# Patient Record
Sex: Female | Born: 1965 | Race: White | Hispanic: No | Marital: Married | Smoking: Current every day smoker
Health system: Southern US, Community
[De-identification: ages and names within clinical notes are randomized; demographics above are authoritative.]

## PROBLEM LIST (undated history)

## (undated) DIAGNOSIS — Z8719 Personal history of other diseases of the digestive system: Secondary | ICD-10-CM

## (undated) DIAGNOSIS — R413 Other amnesia: Secondary | ICD-10-CM

## (undated) DIAGNOSIS — R51 Headache: Secondary | ICD-10-CM

## (undated) DIAGNOSIS — I10 Essential (primary) hypertension: Secondary | ICD-10-CM

## (undated) DIAGNOSIS — G8929 Other chronic pain: Secondary | ICD-10-CM

## (undated) DIAGNOSIS — F430 Acute stress reaction: Secondary | ICD-10-CM

## (undated) DIAGNOSIS — F410 Panic disorder [episodic paroxysmal anxiety] without agoraphobia: Secondary | ICD-10-CM

## (undated) DIAGNOSIS — E785 Hyperlipidemia, unspecified: Secondary | ICD-10-CM

## (undated) DIAGNOSIS — F32A Depression, unspecified: Secondary | ICD-10-CM

## (undated) DIAGNOSIS — D649 Anemia, unspecified: Secondary | ICD-10-CM

## (undated) DIAGNOSIS — K219 Gastro-esophageal reflux disease without esophagitis: Secondary | ICD-10-CM

## (undated) DIAGNOSIS — R55 Syncope and collapse: Secondary | ICD-10-CM

## (undated) DIAGNOSIS — F329 Major depressive disorder, single episode, unspecified: Secondary | ICD-10-CM

## (undated) DIAGNOSIS — T8859XA Other complications of anesthesia, initial encounter: Secondary | ICD-10-CM

## (undated) DIAGNOSIS — R251 Tremor, unspecified: Secondary | ICD-10-CM

## (undated) DIAGNOSIS — R569 Unspecified convulsions: Secondary | ICD-10-CM

## (undated) DIAGNOSIS — K589 Irritable bowel syndrome without diarrhea: Secondary | ICD-10-CM

## (undated) DIAGNOSIS — T4145XA Adverse effect of unspecified anesthetic, initial encounter: Secondary | ICD-10-CM

## (undated) DIAGNOSIS — E119 Type 2 diabetes mellitus without complications: Secondary | ICD-10-CM

## (undated) DIAGNOSIS — M549 Dorsalgia, unspecified: Secondary | ICD-10-CM

## (undated) DIAGNOSIS — M1990 Unspecified osteoarthritis, unspecified site: Secondary | ICD-10-CM

## (undated) DIAGNOSIS — G4700 Insomnia, unspecified: Secondary | ICD-10-CM

## (undated) HISTORY — DX: Major depressive disorder, single episode, unspecified: F32.9

## (undated) HISTORY — DX: Syncope and collapse: R55

## (undated) HISTORY — PX: APPENDECTOMY: SHX54

## (undated) HISTORY — DX: Depression, unspecified: F32.A

## (undated) HISTORY — DX: Panic disorder (episodic paroxysmal anxiety): F41.0

## (undated) HISTORY — DX: Other amnesia: R41.3

## (undated) HISTORY — DX: Anemia, unspecified: D64.9

## (undated) HISTORY — DX: Other chronic pain: G89.29

## (undated) HISTORY — DX: Unspecified convulsions: R56.9

## (undated) HISTORY — PX: TONSILLECTOMY: SUR1361

## (undated) HISTORY — DX: Hyperlipidemia, unspecified: E78.5

## (undated) HISTORY — PX: ABDOMINAL HYSTERECTOMY: SHX81

## (undated) HISTORY — DX: Tremor, unspecified: R25.1

## (undated) HISTORY — DX: Headache: R51

## (undated) HISTORY — DX: Insomnia, unspecified: G47.00

## (undated) HISTORY — DX: Dorsalgia, unspecified: M54.9

## (undated) HISTORY — PX: CHOLECYSTECTOMY: SHX55

## (undated) HISTORY — DX: Type 2 diabetes mellitus without complications: E11.9

## (undated) HISTORY — DX: Acute stress reaction: F43.0

## (undated) HISTORY — DX: Irritable bowel syndrome, unspecified: K58.9

## (undated) HISTORY — DX: Personal history of other diseases of the digestive system: Z87.19

## (undated) HISTORY — DX: Essential (primary) hypertension: I10

---

## 1994-12-20 ENCOUNTER — Encounter: Payer: Self-pay | Admitting: Internal Medicine

## 1998-01-15 ENCOUNTER — Other Ambulatory Visit: Admission: RE | Admit: 1998-01-15 | Discharge: 1998-01-15 | Payer: Self-pay | Admitting: Obstetrics and Gynecology

## 2003-09-09 ENCOUNTER — Encounter: Admission: RE | Admit: 2003-09-09 | Discharge: 2003-09-09 | Payer: Self-pay | Admitting: Family Medicine

## 2003-10-30 ENCOUNTER — Encounter: Payer: Self-pay | Admitting: Family Medicine

## 2004-12-09 ENCOUNTER — Ambulatory Visit (HOSPITAL_COMMUNITY): Admission: RE | Admit: 2004-12-09 | Discharge: 2004-12-09 | Payer: Self-pay | Admitting: Family Medicine

## 2005-12-13 ENCOUNTER — Ambulatory Visit (HOSPITAL_COMMUNITY): Admission: RE | Admit: 2005-12-13 | Discharge: 2005-12-13 | Payer: Self-pay | Admitting: General Practice

## 2007-01-03 ENCOUNTER — Ambulatory Visit (HOSPITAL_COMMUNITY): Admission: RE | Admit: 2007-01-03 | Discharge: 2007-01-03 | Payer: Self-pay | Admitting: General Practice

## 2007-08-22 ENCOUNTER — Encounter: Payer: Self-pay | Admitting: Internal Medicine

## 2008-01-04 HISTORY — PX: ESOPHAGOGASTRODUODENOSCOPY: SHX1529

## 2008-01-04 HISTORY — PX: COLONOSCOPY W/ BIOPSIES: SHX1374

## 2008-01-24 ENCOUNTER — Ambulatory Visit (HOSPITAL_COMMUNITY): Admission: RE | Admit: 2008-01-24 | Discharge: 2008-01-24 | Payer: Self-pay | Admitting: General Practice

## 2008-02-25 ENCOUNTER — Encounter: Payer: Self-pay | Admitting: Internal Medicine

## 2008-04-17 DIAGNOSIS — K589 Irritable bowel syndrome without diarrhea: Secondary | ICD-10-CM | POA: Insufficient documentation

## 2008-04-17 DIAGNOSIS — Z860100 Personal history of colon polyps, unspecified: Secondary | ICD-10-CM | POA: Insufficient documentation

## 2008-04-17 DIAGNOSIS — E119 Type 2 diabetes mellitus without complications: Secondary | ICD-10-CM | POA: Insufficient documentation

## 2008-04-17 DIAGNOSIS — E785 Hyperlipidemia, unspecified: Secondary | ICD-10-CM | POA: Insufficient documentation

## 2008-04-17 DIAGNOSIS — Z794 Long term (current) use of insulin: Secondary | ICD-10-CM

## 2008-04-21 ENCOUNTER — Ambulatory Visit: Payer: Self-pay | Admitting: Internal Medicine

## 2008-04-21 DIAGNOSIS — Z8719 Personal history of other diseases of the digestive system: Secondary | ICD-10-CM

## 2008-04-21 DIAGNOSIS — F411 Generalized anxiety disorder: Secondary | ICD-10-CM | POA: Insufficient documentation

## 2008-04-21 DIAGNOSIS — R10816 Epigastric abdominal tenderness: Secondary | ICD-10-CM | POA: Insufficient documentation

## 2008-04-21 DIAGNOSIS — R519 Headache, unspecified: Secondary | ICD-10-CM | POA: Insufficient documentation

## 2008-04-21 DIAGNOSIS — R51 Headache: Secondary | ICD-10-CM

## 2008-04-21 DIAGNOSIS — I10 Essential (primary) hypertension: Secondary | ICD-10-CM | POA: Insufficient documentation

## 2008-04-21 DIAGNOSIS — Z9089 Acquired absence of other organs: Secondary | ICD-10-CM | POA: Insufficient documentation

## 2008-04-21 DIAGNOSIS — F329 Major depressive disorder, single episode, unspecified: Secondary | ICD-10-CM | POA: Insufficient documentation

## 2008-04-21 HISTORY — DX: Headache: R51

## 2008-04-21 HISTORY — DX: Personal history of other diseases of the digestive system: Z87.19

## 2008-04-22 ENCOUNTER — Ambulatory Visit: Payer: Self-pay | Admitting: Internal Medicine

## 2008-04-22 ENCOUNTER — Encounter: Payer: Self-pay | Admitting: Internal Medicine

## 2008-04-23 ENCOUNTER — Telehealth: Payer: Self-pay | Admitting: Internal Medicine

## 2008-04-23 LAB — CONVERTED CEMR LAB
Alkaline Phosphatase: 77 units/L (ref 39–117)
Bilirubin, Direct: 0.2 mg/dL (ref 0.0–0.3)
Total Bilirubin: 0.7 mg/dL (ref 0.3–1.2)
Total Protein: 7.3 g/dL (ref 6.0–8.3)

## 2008-04-24 ENCOUNTER — Telehealth: Payer: Self-pay | Admitting: Internal Medicine

## 2008-04-24 ENCOUNTER — Encounter: Payer: Self-pay | Admitting: Internal Medicine

## 2008-05-28 ENCOUNTER — Ambulatory Visit: Payer: Self-pay | Admitting: Internal Medicine

## 2008-06-12 ENCOUNTER — Telehealth: Payer: Self-pay | Admitting: Internal Medicine

## 2008-12-19 ENCOUNTER — Encounter: Admission: RE | Admit: 2008-12-19 | Discharge: 2008-12-31 | Payer: Self-pay | Admitting: General Practice

## 2009-01-03 HISTORY — PX: LUMBAR FUSION: SHX111

## 2009-02-25 ENCOUNTER — Ambulatory Visit (HOSPITAL_COMMUNITY): Admission: RE | Admit: 2009-02-25 | Discharge: 2009-02-25 | Payer: Self-pay | Admitting: General Practice

## 2009-06-05 ENCOUNTER — Ambulatory Visit: Payer: Self-pay | Admitting: Family Medicine

## 2009-06-05 DIAGNOSIS — F319 Bipolar disorder, unspecified: Secondary | ICD-10-CM | POA: Insufficient documentation

## 2009-06-05 DIAGNOSIS — M549 Dorsalgia, unspecified: Secondary | ICD-10-CM | POA: Insufficient documentation

## 2009-07-15 ENCOUNTER — Ambulatory Visit: Payer: Self-pay | Admitting: Family Medicine

## 2009-07-15 ENCOUNTER — Encounter: Payer: Self-pay | Admitting: Family Medicine

## 2009-07-15 LAB — CONVERTED CEMR LAB
BUN: 13 mg/dL (ref 6–23)
CO2: 18 meq/L — ABNORMAL LOW (ref 19–32)
Cholesterol: 160 mg/dL (ref 0–200)
Creatinine, Ser: 0.82 mg/dL (ref 0.40–1.20)
Glucose, Bld: 201 mg/dL — ABNORMAL HIGH (ref 70–99)
HCT: 43.1 % (ref 36.0–46.0)
MCV: 99.3 fL (ref 78.0–100.0)
RBC: 4.34 M/uL (ref 3.87–5.11)
Total Bilirubin: 0.4 mg/dL (ref 0.3–1.2)
Total CHOL/HDL Ratio: 9.4
Triglycerides: 747 mg/dL — ABNORMAL HIGH (ref <150)
WBC: 8.6 10*3/uL (ref 4.0–10.5)

## 2009-07-24 ENCOUNTER — Telehealth: Payer: Self-pay | Admitting: Family Medicine

## 2009-08-03 ENCOUNTER — Ambulatory Visit: Payer: Self-pay | Admitting: Family Medicine

## 2009-08-05 ENCOUNTER — Ambulatory Visit: Payer: Self-pay | Admitting: Family Medicine

## 2009-08-05 ENCOUNTER — Encounter: Payer: Self-pay | Admitting: Family Medicine

## 2009-08-11 ENCOUNTER — Encounter: Admission: RE | Admit: 2009-08-11 | Discharge: 2009-09-22 | Payer: Self-pay | Admitting: Family Medicine

## 2009-08-31 ENCOUNTER — Telehealth: Payer: Self-pay | Admitting: Family Medicine

## 2009-09-11 ENCOUNTER — Ambulatory Visit: Payer: Self-pay | Admitting: Family Medicine

## 2009-09-11 ENCOUNTER — Ambulatory Visit (HOSPITAL_COMMUNITY): Admission: RE | Admit: 2009-09-11 | Discharge: 2009-09-11 | Payer: Self-pay | Admitting: Family Medicine

## 2009-09-11 DIAGNOSIS — R569 Unspecified convulsions: Secondary | ICD-10-CM | POA: Insufficient documentation

## 2009-09-16 ENCOUNTER — Encounter: Payer: Self-pay | Admitting: Family Medicine

## 2009-09-17 ENCOUNTER — Encounter: Admission: RE | Admit: 2009-09-17 | Discharge: 2009-09-17 | Payer: Self-pay | Admitting: Sports Medicine

## 2009-09-23 ENCOUNTER — Encounter
Admission: RE | Admit: 2009-09-23 | Discharge: 2009-12-22 | Payer: Self-pay | Source: Home / Self Care | Attending: Family Medicine | Admitting: Family Medicine

## 2009-10-22 ENCOUNTER — Encounter: Payer: Self-pay | Admitting: Family Medicine

## 2009-10-30 ENCOUNTER — Telehealth: Payer: Self-pay | Admitting: *Deleted

## 2009-11-13 ENCOUNTER — Ambulatory Visit: Payer: Self-pay | Admitting: Family Medicine

## 2009-11-17 ENCOUNTER — Inpatient Hospital Stay (HOSPITAL_COMMUNITY): Admission: RE | Admit: 2009-11-17 | Discharge: 2009-11-30 | Payer: Self-pay | Admitting: Orthopedic Surgery

## 2009-11-17 ENCOUNTER — Encounter (INDEPENDENT_AMBULATORY_CARE_PROVIDER_SITE_OTHER): Payer: Self-pay | Admitting: Orthopedic Surgery

## 2009-11-19 ENCOUNTER — Ambulatory Visit: Payer: Self-pay | Admitting: Vascular Surgery

## 2009-11-21 DIAGNOSIS — F312 Bipolar disorder, current episode manic severe with psychotic features: Secondary | ICD-10-CM

## 2009-12-31 ENCOUNTER — Telehealth (INDEPENDENT_AMBULATORY_CARE_PROVIDER_SITE_OTHER): Payer: Self-pay | Admitting: *Deleted

## 2010-01-01 ENCOUNTER — Ambulatory Visit: Admission: RE | Admit: 2010-01-01 | Discharge: 2010-01-01 | Payer: Self-pay | Source: Home / Self Care

## 2010-01-01 ENCOUNTER — Encounter: Payer: Self-pay | Admitting: Family Medicine

## 2010-01-01 LAB — CONVERTED CEMR LAB
CO2: 26 meq/L (ref 19–32)
Direct LDL: 68 mg/dL
Glucose, Bld: 282 mg/dL — ABNORMAL HIGH (ref 70–99)
Sodium: 137 meq/L (ref 135–145)
Total Bilirubin: 0.4 mg/dL (ref 0.3–1.2)
Total Protein: 6.8 g/dL (ref 6.0–8.3)

## 2010-01-05 ENCOUNTER — Encounter: Payer: Self-pay | Admitting: Family Medicine

## 2010-01-08 ENCOUNTER — Encounter: Payer: Self-pay | Admitting: *Deleted

## 2010-01-27 ENCOUNTER — Ambulatory Visit: Admit: 2010-01-27 | Payer: Self-pay

## 2010-02-04 NOTE — Assessment & Plan Note (Signed)
Summary: NP,CHRONIC BACK PAIN,MC   Vital Signs:  Patient profile:   45 year old female Height:      64.5 inches Weight:      305.1 pounds BMI:     51.75 Pulse rate:   108 / minute BP sitting:   138 / 81  (right arm)  Vitals Entered By: Terese Door (August 03, 2009 1:34 PM) CC: chronic back pain   Primary Care Provider:  Ellery Plunk MD  CC:  chronic back pain.  History of Present Illness: chronic back pain here for second opinion from her [facility] doctor. Pain is diffuse, constant 5-6 /10 in low back. WWorse with standing or walkking. Some better with sitting. Some radiation to upper buttock but not into legs. No incontinence. No leg weakness. No leg numbness or parasthesias.  Pain has been worse in last 1 year or so. Does not exercise. Does NOT want to try physical therapy.  PERTINENT PMH/PSH: No specific back injury. Has gained a significant amount of weight (> 40 pounds) in last year  Current Medications (verified): 1)  Lithium Carbonate 300 Mg Tabs (Lithium Carbonate) .... 3 Tablets By Mouth Qhs 2)  Lamictal 100 Mg Tabs (Lamotrigine) .... One Tablet By Mouth Two Times A Day 3)  Cymbalta 60 Mg Cpep (Duloxetine Hcl) .... One Tablet By Mouth Once Daily 4)  Alprazolam 1 Mg Tabs (Alprazolam) .... Take One in Am, One in Pm and 1.5 At Bedtime Per Psychiatry, Not Mcfp 5)  Pravastatin Sodium 40 Mg Tabs (Pravastatin Sodium) .... One Tablet By Mouth Once Daily 6)  Lisinopril-Hydrochlorothiazide 20-12.5 Mg Tabs (Lisinopril-Hydrochlorothiazide) .... One Tablet By Mouth Once Daily 7)  Tricor 145 Mg Tabs (Fenofibrate) .... One Tablet By Mouth Once Daily 8)  Metformin Hcl 1000 Mg Tabs (Metformin Hcl) .... One Tablet By Mouth Two Times A Day 9)  Questran 4 Gm Pack (Cholestyramine) .... Take 1 Pack Dissolved in Water or Juice Once Daily. Take At Least 1 Hour Away From Other Medications. 10)  Nexium 40 Mg Cpdr (Esomeprazole Magnesium) .... Take 1 Tablet By Mouth Once A Day 11)  Lidoderm 5 %  Ptch (Lidocaine) .... Place At Site of Pain (Up and Down Back) For 12 Hours Then Remove 12)  Januvia 50 Mg Tabs (Sitagliptin Phosphate) .... Take One Daily 13)  Meloxicam 15 Mg Tabs (Meloxicam) .... One Daily 14)  Furosemide 40 Mg Tabs (Furosemide) .... Take Every Other Day 15)  Norvasc 10 Mg Tabs (Amlodipine Besylate) 16)  Claritin 10 Mg Tabs (Loratadine)  Allergies: No Known Drug Allergies  Review of Systems       The patient complains of weight gain.  The patient denies anorexia, fever, and weight loss.         Please see HPI for additional ROS.   Physical Exam  General:  alert, well-developed, well-nourished, well-hydrated, and overweight-appearing.   Msk:  No deformity or scoliosis noted of thoracic or lumbar spine.   Extremities:  No clubbing, cyanosis, edema, or deformity noted with normal full range of motion of all joints.   Additional Exam:  I reviewed the ;ateral LS spine film she brings with her--unfortunately itis of little use as her habitus precludes much detail. It is grossly normal otherwise.   Detailed Back/Spine Exam  Lumbosacral Exam:  Inspection-deformity:    Normal Palpation-spinal tenderness:  Normal Range of Motion:    Forward Flexion:   80 degrees    Hyperextension:   10 degrees    Right Lateral Bend:  20 degrees    Left Lateral Bend:   20 degrees Lying Straight Leg Raise:    Right:  negative    Left:  negative Sitting Straight Leg Raise:    Right:  negative    Left:  negative Contralateral Straight Leg Raise:    Right:  negative    Left:  negative Toe Walking:    Right:  normal    Left:  normal Heel Walking:    Right:  normal    Left:  normal Fabere Test:    Right:  negative    Left:  negative   Impression & Recommendations:  Problem # 1:  BACK PAIN, CHRONIC (ICD-724.5) long discussion--poor strength and flexibility inher core, overweight and probably some facet arthritis are the most likely etiology for her back pain. PT would be  her best bet but she does not want to pursue. I gave hger handout on low back HEP and explained in detail. I would have her f/u w PCP  NOTE: WHile here, patient had an apparent seizure with alteremd mental stae, no convulsions, some dilation of her left pupil. No incontinence. Her altered level of consciousness lasted for about 2 minites and her pupil returned to normal size soon therafter. It was  reactive at the time of her "spell". Heer husband was with her and informed us this is common and is being worked up by PCP.  Complete Medication List: 1)  Lithium Carbonate 300 Mg Tabs (Lithium carbonate) .... 3 tablets by mouth qhs 2)  Lamictal 100 Mg Tabs (Lamotrigine) .... One tablet by mouth two times a day 3)  Cymbalta 60 Mg Cpep (Duloxetine hcl) .... One tablet by mouth once daily 4)  Alprazolam 1 Mg Tabs (Alprazolam) .... Take one in am, one in pm and 1.5 at bedtime per psychiatry, not mcfp 5)  Pravastatin Sodium 40 Mg Tabs (Pravastatin sodium) .... One tablet by mouth once daily 6)  Lisinopril-hydrochlorothiazide 20-12.5 Mg Tabs (Lisinopril-hydrochlorothiazide) .... One tablet by mouth once daily 7)  Tricor 145 Mg Tabs (Fenofibrate) .... One tablet by mouth once daily 8)  Metformin Hcl 1000 Mg Tabs (Metformin hcl) .... One tablet by mouth two times a day 9)  Questran 4 Gm Pack (Cholestyramine) .... Take 1 pack dissolved in water or juice once daily. take at least 1 hour away from other medications. 10)  Nexium 40 Mg Cpdr (Esomeprazole magnesium) .... Take 1 tablet by mouth once a day 11)  Lidoderm 5 % Ptch (Lidocaine) .... Place at site of pain (up and down back) for 12 hours then remove 12)  Januvia 50 Mg Tabs (Sitagliptin phosphate) .... Take one daily 13)  Meloxicam 15 Mg Tabs (Meloxicam) .... One daily 14)  Furosemide 40 Mg Tabs (Furosemide) .... Take every other day 15)  Norvasc 10 Mg Tabs (Amlodipine besylate) 16)  Claritin 10 Mg Tabs (Loratadine)  Appended Document: NP,CHRONIC BACK  PAIN,MC pt changed her mind and agrees to formal PT we will set up

## 2010-02-04 NOTE — Letter (Signed)
Summary: Psychciatry-Dr. Emerson Monte  Psychciatry-Dr. Emerson Monte   Imported By: Clydell Hakim 08/10/2009 15:44:01  _____________________________________________________________________  External Attachment:    Type:   Image     Comment:   External Document

## 2010-02-04 NOTE — Assessment & Plan Note (Signed)
Summary: f/up,tcb   Vital Signs:  Patient profile:   45 year old female Height:      64.5 inches Weight:      297 pounds BMI:     50.37 BSA:     2.33 Temp:     98.9 degrees F Pulse rate:   111 / minute BP sitting:   130 / 85  Vitals Entered By: Jone Baseman CMA (September 11, 2009 9:56 AM) CC: Diabetes, back pain, obesity, ? seizures Is Patient Diabetic? Yes Did you bring your meter with you today? No Pain Assessment Patient in pain? yes     Location: back Intensity: 9   Primary Care Provider:  Ellery Plunk MD  CC:  Diabetes, back pain, obesity, and ? seizures.  History of Present Illness: DM- fasting CBG 150-250, taking metformin and januvia.  working on diet.  will be going to DME this week.  back pain-  failed PT, pain in lower back is worse, continuing to exercise and use heat.  she is concerned that she has a fracture in lower back and that last xrays were poor quality.  pt wants new xrays and referral to ortho as her father had back surgery and pain improved.  obesity-weight down 7 lbs.  trying to exercise more and eat better.  wants to consider gastric bypass  ? seizures- has several year hx of periods where she loses her awareness of surroundings.  one occurred in Dr. Donnetta Hail office and her note states that pt had pupillary changes during this episode.  However, pt has recently had some periods of jerking.  she states that both sides of her body jerk but that she can still hear people.  she is not driving.   Habits & Providers  Alcohol-Tobacco-Diet     Alcohol drinks/day: 0     Tobacco Status: current     Tobacco Counseling: to quit use of tobacco products     Cigarette Packs/Day: 1.0     Diet Comments: poor     Diet Counseling: to improve diet; diet is suboptimal  Current Medications (verified): 1)  Lithium Carbonate 300 Mg Tabs (Lithium Carbonate) .... 3 Tablets By Mouth Qhs 2)  Lamictal 100 Mg Tabs (Lamotrigine) .... One Tablet By Mouth Two Times A  Day 3)  Cymbalta 60 Mg Cpep (Duloxetine Hcl) .... One Tablet By Mouth Once Daily 4)  Alprazolam 1 Mg Tabs (Alprazolam) .... Take One in Am, One in Pm and 1.5 At Bedtime Per Psychiatry, Not Mcfp 5)  Pravastatin Sodium 40 Mg Tabs (Pravastatin Sodium) .... One Tablet By Mouth Once Daily 6)  Lisinopril-Hydrochlorothiazide 20-12.5 Mg Tabs (Lisinopril-Hydrochlorothiazide) .... One Tablet By Mouth Once Daily 7)  Tricor 145 Mg Tabs (Fenofibrate) .... One Tablet By Mouth Once Daily 8)  Metformin Hcl 1000 Mg Tabs (Metformin Hcl) .... One Tablet By Mouth Two Times A Day 9)  Questran 4 Gm Pack (Cholestyramine) .... Take 1 Pack Dissolved in Water or Juice Once Daily. Take At Least 1 Hour Away From Other Medications. 10)  Nexium 40 Mg Cpdr (Esomeprazole Magnesium) .... Take 1 Tablet By Mouth Once A Day 11)  Lidoderm 5 % Ptch (Lidocaine) .... Place At Site of Pain (Up and Down Back) For 12 Hours Then Remove 12)  Januvia 50 Mg Tabs (Sitagliptin Phosphate) .... Take One Daily 13)  Meloxicam 15 Mg Tabs (Meloxicam) .... One Daily 14)  Furosemide 40 Mg Tabs (Furosemide) .... Take Every Other Day 15)  Norvasc 10 Mg Tabs (Amlodipine Besylate)  16)  Claritin 10 Mg Tabs (Loratadine)  Allergies (verified): No Known Drug Allergies  Past History:  Past Medical History: Current Problems:  OBESITY (ICD-278.00) HYPERTENSION (ICD-401.9) HX OF GALLSTONE (ICD-V12.79) DEPRESSION (ICD-311) HEADACHE, CHRONIC (ICD-784.0) ANXIETY (ICD-300.00) COLONIC POLYPS, HYPERPLASTIC, HX OF (ICD-V12.72) HYPERLIPIDEMIA (ICD-272.4) DIABETES MELLITUS (ICD-250.00) IRRITABLE BOWEL SYNDROME (ICD-564.1) panic attacks insomnia tremor "short term memory loss"  chronic back pain  Review of Systems  The patient denies anorexia, fever, hoarseness, chest pain, syncope, and headaches.    Physical Exam  General:  VS reviewed.  obese.  alert.   Head:  normocephalic and atraumatic.   Eyes:  vision grossly intact, pupils equal, pupils  round, and pupils reactive to light.  EOMI. Lungs:  Normal respiratory effort, chest expands symmetrically. Lungs are clear to auscultation, no crackles or wheezes. Heart:  Normal rate and regular rhythm. S1 and S2 normal without gallop, murmur, click, rub or other extra sounds. Abdomen:  obese.  soft and non-tender.   Msk:  normal gait, normal ROM and strength in upper and lower ext.  has grossly normal ROM in back.  TTP on muscles arround spine of lumbar region.  no TTP on spinus processes Neurologic:  alert & oriented X3, cranial nerves II-XII intact, strength normal in all extremities, sensation intact to light touch, gait normal, and finger-to-nose normal.   Psych:  Oriented X3, memory intact for recent and remote, normally interactive, and moderately anxious.     Impression & Recommendations:  Problem # 1:  BACK PAIN, CHRONIC (ICD-724.5) Assessment Deteriorated pain continues to worsen despite PT.  PT was discontinued since pt was not improving.  WIll obtain xrays today due to pt concern though very unlikely a fx much more likely OA.  I do not think that any operation would be done, but pt desires referral to ortho since her father benefits from back surgery.  Her updated medication list for this problem includes:    Meloxicam 15 Mg Tabs (Meloxicam) ..... One daily  Orders: Diagnostic X-Ray/Fluoroscopy (Diagnostic X-Ray/Flu) Diagnostic X-Ray/Fluoroscopy (Diagnostic X-Ray/Flu) Orthopedic Referral (Ortho) [facility]- Est  Level 4 ([address]109)  Problem # 2:  OTHER CONVULSIONS (ICD-780.39) Assessment: Deteriorated pt with long hx of staring spells and drop spells.  she has been worked up for them before and been told that they were "silent seizures"  she is now complaining of new symptoms including jerking bilaterally while she can still hear people around her.  given her complicated medical situation, having a neurologist here see her for her symptoms seems reasonable, though the new symptoms dont  seem consistent with seizure.    Orders: Neurology Referral (Neuro) [facility]- Est  Level 4 (60454)  Problem # 3:  OBESITY (ICD-278.00) Assessment: Improved pt working to improve diet and exercise.  pt to look into gastric bypass as to which doctors take medicaid.  she will let us know if she needs a rferral from [place].  Orders: [facility]- Est  Level 4 (09811)  Problem # 4:  DIABETES MELLITUS (ICD-250.00) Assessment: Improved A1C very good today.  pt to continue meds, diet, and exercise. Her updated medication list for this problem includes:    Lisinopril-hydrochlorothiazide 20-12.5 Mg Tabs (Lisinopril-hydrochlorothiazide) ..... One tablet by mouth once daily    Metformin Hcl 1000 Mg Tabs (Metformin hcl) ..... One tablet by mouth two times a day    Januvia 50 Mg Tabs (Sitagliptin phosphate) .Marland Kitchen... Take one daily  Orders: [facility]- Est  Level 4 (91478)  Complete Medication List: 1)  Lithium Carbonate 300 Mg  Tabs (Lithium carbonate) .... 3 tablets by mouth qhs 2)  Lamictal 100 Mg Tabs (Lamotrigine) .... One tablet by mouth two times a day 3)  Cymbalta 60 Mg Cpep (Duloxetine hcl) .... One tablet by mouth once daily 4)  Alprazolam 1 Mg Tabs (Alprazolam) .... Take one in am, one in pm and 1.5 at bedtime per psychiatry, not mcfp 5)  Pravastatin Sodium 40 Mg Tabs (Pravastatin sodium) .... One tablet by mouth once daily 6)  Lisinopril-hydrochlorothiazide 20-12.5 Mg Tabs (Lisinopril-hydrochlorothiazide) .... One tablet by mouth once daily 7)  Tricor 145 Mg Tabs (Fenofibrate) .... One tablet by mouth once daily 8)  Metformin Hcl 1000 Mg Tabs (Metformin hcl) .... One tablet by mouth two times a day 9)  Questran 4 Gm Pack (Cholestyramine) .... Take 1 pack dissolved in water or juice once daily. take at least 1 hour away from other medications. 10)  Nexium 40 Mg Cpdr (Esomeprazole magnesium) .... Take 1 tablet by mouth once a day 11)  Lidoderm 5 % Ptch (Lidocaine) .... Place at site of pain (up and down back) for  12 hours then remove 12)  Januvia 50 Mg Tabs (Sitagliptin phosphate) .... Take one daily 13)  Meloxicam 15 Mg Tabs (Meloxicam) .... One daily 14)  Furosemide 40 Mg Tabs (Furosemide) .... Take every other day 15)  Norvasc 10 Mg Tabs (Amlodipine besylate) 16)  Claritin 10 Mg Tabs (Loratadine)

## 2010-02-04 NOTE — Progress Notes (Signed)
Summary: Rx Req  Phone Note Refill Request Call back at Home Phone [phone] Message from:  Patient  Refills Requested: Medication #1:  PRAVASTATIN SODIUM 40 MG TABS one tablet by mouth once daily [facility].  Initial call taken by: Clydell Hakim,  October 30, 2009 2:28 PM  Follow-up for Phone Call        filled Follow-up by: Ellery Plunk MD,  October 30, 2009 3:00 PM    Prescriptions: PRAVASTATIN SODIUM 40 MG TABS (PRAVASTATIN SODIUM) one tablet by mouth once daily  #30 x 6   Entered and Authorized by:   Ellery Plunk MD   Signed by:   Ellery Plunk MD on 10/30/2009   Method used:   Electronically to        [facility]  [phone]* (retail)       [address]       [place], [place]  [address]       Ph: [phone] or [phone]       Fax: [phone]   RxID:   [id number]

## 2010-02-04 NOTE — Progress Notes (Signed)
  Phone Note Outgoing Call   Summary of Call: discussed lab results with pt.  triglycerides still high.  stay on tricor, add over the counter fish oil, and avoid any fried foods.  also, pt complaining of back pain that is chronic.  she wants to go to a back surgeon and does not want physical therapy.  Discussed with Dr. Jennette Kettle who recommends [facility] visit. Initial call taken by: Ellery Plunk MD,  July 24, 2009 2:36 PM

## 2010-02-04 NOTE — Consult Note (Signed)
Summary: [facility]   Imported By: Clydell Hakim 09/24/2009 11:26:39  _____________________________________________________________________  External Attachment:    Type:   Image     Comment:   External Document

## 2010-02-04 NOTE — Miscellaneous (Signed)
Summary: re: pain meds/TS  called pt. Advised to call surgeon for pain meds. Pt said, that she saw her surgeon yesterday. Also advised pt to keep her upcoming appt with her PCP Dr.Spiegel 01-27-10. Pt agreed.Arlyss Repress CMA,  January 08, 2010 11:32 AM

## 2010-02-04 NOTE — Progress Notes (Signed)
Summary: Rx  Phone Note Call from Patient Call back at Home Phone [phone]   Reason for Call: Talk to Nurse Summary of Call: pt recently discharged from [facility], was given Rx for DM supplies but was not given Rx for syringes, pt needs [facility]. Pt was only given 1 bottle of insulin so may need refills on it too, this is pts 1st time using insulin.  Initial call taken by: Knox Royalty,  December 31, 2009 2:00 PM  Follow-up for Phone Call        patient was discharge just a few hours ago from facility , states she was started on insulin in hospital . Rehab facility would not give her any syringes to take home. from E Chart it is noted that patient is on Novolog insulin per sliding scale. consulted with Dr.Neal and she ok's to send in syringes.  patient is confused about meds that she is suppose to be on . she doesn't think she was getting BP meds at Rehab facility and she states her BP has been running high. she is not sure what she is suppose to be on now. appointment scheduled for tomorrow PM with Dr. Jeanice Lim to help patient with this. Follow-up by: Theresia Lo RN,  December 31, 2009 3:02 PM

## 2010-02-04 NOTE — Progress Notes (Signed)
Summary: meds prob  Phone Note Call from Patient Call back at Home Phone [phone]   Caller: Patient Summary of Call: wants to know if she can have something called in for back pain- only took 1 hydrocodone and it didn't work so she didn't take anymore.  Initial call taken by: De Nurse,  August 31, 2009 11:50 AM  Follow-up for Phone Call        states she has some pain pills left over from last May. has not taken any since then. pain is a 9/10. goes to PT twice a week & does her exercises at home. has not taken anything for the pain, not even tylenol. does use a heating pad which helps.   offered her an appt. states she has one on 09/11/09 & since she has waited this long, she will wait until then. told her if she changes her mind we can usually get her in same day, though it may not be with her md.  advised trying one of her pain meds, continue exercises & heating pad. she is also using her husband's TENS unit which helps. to pcp Follow-up by: Golden Circle RN,  August 31, 2009 11:56 AM  Additional Follow-up for Phone Call Additional follow up Details #1::        Thanks Kennon Rounds.  THis is chronic back pain and I will avoid starting chronic narcotics if I can.  I guess I will see her when she comes in. Additional Follow-up by: Ellery Plunk MD,  August 31, 2009 7:00 PM

## 2010-02-04 NOTE — Assessment & Plan Note (Signed)
Summary: f/u results/eo   Vital Signs:  Patient profile:   45 year old female Height:      64.5 inches Weight:      303 pounds BMI:     51.39 BSA:     2.35 Temp:     98.0 degrees F Pulse rate:   98 / minute BP sitting:   129 / 81  Vitals Entered By: Jone Baseman CMA (August 05, 2009 8:56 AM) CC: f/u DM, obesity, back pain, psych Is Patient Diabetic? Yes Did you bring your meter with you today? No Pain Assessment Patient in pain? no        Diabetic Foot Exam Foot Inspection Is there a history of a foot ulcer?              No Is there a foot ulcer now?              No Can the patient see the bottom of their feet?          No Are the shoes appropriate in style and fit?          Yes Is there swelling or an abnormal foot shape?          Yes Are the toenails long?                No Are the toenails thick?                No Are the toenails ingrown?              No Is there heavy callous build-up?              Yes Is there pain in the calf muscle (Intermittent claudication) when walking?    NoIs there a claw toe deformity?              No Is there elevated skin temperature?            No Is there limited ankle dorsiflexion?            No Is there foot or ankle muscle weakness?            No  Diabetic Foot Care Education Patient educated on appropriate care of diabetic feet.   High Risk Feet? No   10-g (5.07) Semmes-Weinstein Monofilament Test           Right Foot          Left Foot Visual Inspection               Test Control      normal         normal Site 1         normal         normal Site 2         normal         normal Site 3         normal         normal Site 4         normal         normal Site 5         normal         normal Site 6         normal         normal Site 7         normal         normal  Site 8         normal         normal Site 9         normal         normal Site 10         normal         normal  Impression      normal          normal   Primary Care Provider:  Ellery Plunk MD  CC:  f/u DM, obesity, back pain, and psych.  History of Present Illness: DM- wants some more education on diet.  she feels "mind boggled" and her short term memory loss prevents her from remembering what she can eat.  Takes CBGs in AM, but not written down.    Obesity- considering gastric bypass vs lap band.  will need a provider that takes medicaid.  no exercise.  no change in diet.  "doesn't eat" meaning that she skips meals a lot.  back pain-  has seen [facility].  they reccomended PT, which she will start on Tuesday.  She is excited about starting this.    Psychiatric problems-  NP is Valinda Hoar.  She is ok with [place] getting records.  NP is checking levels of lithium and following blood work.  She feels her issues are much better and would like to start getting health under better control.  Habits & Providers  Alcohol-Tobacco-Diet     Tobacco Status: current     Tobacco Counseling: to quit use of tobacco products     Cigarette Packs/Day: 1.0  Current Medications (verified): 1)  Lithium Carbonate 300 Mg Tabs (Lithium Carbonate) .... 3 Tablets By Mouth Qhs 2)  Lamictal 100 Mg Tabs (Lamotrigine) .... One Tablet By Mouth Two Times A Day 3)  Cymbalta 60 Mg Cpep (Duloxetine Hcl) .... One Tablet By Mouth Once Daily 4)  Alprazolam 1 Mg Tabs (Alprazolam) .... Take One in Am, One in Pm and 1.5 At Bedtime Per Psychiatry, Not [facility] 5)  Pravastatin Sodium 40 Mg Tabs (Pravastatin Sodium) .... One Tablet By Mouth Once Daily 6)  Lisinopril-Hydrochlorothiazide 20-12.5 Mg Tabs (Lisinopril-Hydrochlorothiazide) .... One Tablet By Mouth Once Daily 7)  Tricor 145 Mg Tabs (Fenofibrate) .... One Tablet By Mouth Once Daily 8)  Metformin Hcl 1000 Mg Tabs (Metformin Hcl) .... One Tablet By Mouth Two Times A Day 9)  Questran 4 Gm Pack (Cholestyramine) .... Take 1 Pack Dissolved in Water or Juice Once Daily. Take At Least 1 Hour Away From Other Medications. 10)   Nexium 40 Mg Cpdr (Esomeprazole Magnesium) .... Take 1 Tablet By Mouth Once A Day 11)  Lidoderm 5 % Ptch (Lidocaine) .... Place At Site of Pain (Up and Down Back) For 12 Hours Then Remove 12)  Januvia 50 Mg Tabs (Sitagliptin Phosphate) .... Take One Daily 13)  Meloxicam 15 Mg Tabs (Meloxicam) .... One Daily 14)  Furosemide 40 Mg Tabs (Furosemide) .... Take Every Other Day 15)  Norvasc 10 Mg Tabs (Amlodipine Besylate) 16)  Claritin 10 Mg Tabs (Loratadine)  Allergies (verified): No Known Drug Allergies  Past History:  Social History: Last updated: 08/05/2009 Illicit Drug Use - no Married, husband on disability, pt also just got disability and medicaid Patient currently smokes 1 ppd.  Alcohol Use - no  Social History: Illicit Drug Use - no Married, husband on disability, pt also just got disability and medicaid Patient currently smokes 1 ppd.  Alcohol Use - no  Review  of Systems  The patient denies anorexia, fever, weight loss, chest pain, syncope, prolonged cough, and abdominal pain.    Physical Exam  General:  vital signs reviewedalert, appropriate dress, cooperative to examination, and overweight-appearing.   Head:  Normocephalic and atraumatic without obvious abnormalities. No apparent alopecia or balding. Lungs:  Normal respiratory effort, chest expands symmetrically. Lungs are clear to auscultation, no crackles or wheezes. Heart:  distant heart sounds regular rhythm.   Abdomen:  soft and non-tender.   Pulses:  R posterior tibial normal, R dorsalis pedis normal, L posterior tibial normal, and L dorsalis pedis normal.   Extremities:  1+ left pedal edema and 1+ right pedal edema.   Skin:  dry and calloused on feet  Diabetes Management Exam:    Foot Exam (with socks and/or shoes not present):       Sensory-Monofilament:          Left foot: normal          Right foot: normal   Impression & Recommendations:  Problem # 1:  DIABETES MELLITUS (ICD-250.00) Assessment  Unchanged will check A1c in october.  will send for DM education.  might do well with Dr. Gerilyn Pilgrim but sending over to the hospital for now.  seems motivated.  see in one month for f/u Her updated medication list for this problem includes:    Lisinopril-hydrochlorothiazide 20-12.5 Mg Tabs (Lisinopril-hydrochlorothiazide) ..... One tablet by mouth once daily    Metformin Hcl 1000 Mg Tabs (Metformin hcl) ..... One tablet by mouth two times a day    Januvia 50 Mg Tabs (Sitagliptin phosphate) .Marland Kitchen... Take one daily  Problem # 2:  BACK PAIN, CHRONIC (ICD-724.5) Assessment: Improved seen by [facility] and sent to PT.  she is interested in this approach.   Her updated medication list for this problem includes:    Meloxicam 15 Mg Tabs (Meloxicam) ..... One daily  Problem # 3:  OBESITY (ICD-278.00) Assessment: Unchanged interested in gastric bypass.  I told her to start with DM education.  Will need to find provider that takes medicaid.  Problem # 4:  BIPOLAR DISORDER UNSPECIFIED (ICD-296.80) Assessment: Improved  followed by psychiatry.  pt signed release for information today. will get records.  Complete Medication List: 1)  Lithium Carbonate 300 Mg Tabs (Lithium carbonate) .... 3 tablets by mouth qhs 2)  Lamictal 100 Mg Tabs (Lamotrigine) .... One tablet by mouth two times a day 3)  Cymbalta 60 Mg Cpep (Duloxetine hcl) .... One tablet by mouth once daily 4)  Alprazolam 1 Mg Tabs (Alprazolam) .... Take one in am, one in pm and 1.5 at bedtime per psychiatry, not [facility] 5)  Pravastatin Sodium 40 Mg Tabs (Pravastatin sodium) .... One tablet by mouth once daily 6)  Lisinopril-hydrochlorothiazide 20-12.5 Mg Tabs (Lisinopril-hydrochlorothiazide) .... One tablet by mouth once daily 7)  Tricor 145 Mg Tabs (Fenofibrate) .... One tablet by mouth once daily 8)  Metformin Hcl 1000 Mg Tabs (Metformin hcl) .... One tablet by mouth two times a day 9)  Questran 4 Gm Pack (Cholestyramine) .... Take 1 pack dissolved in  water or juice once daily. take at least 1 hour away from other medications. 10)  Nexium 40 Mg Cpdr (Esomeprazole magnesium) .... Take 1 tablet by mouth once a day 11)  Lidoderm 5 % Ptch (Lidocaine) .... Place at site of pain (up and down back) for 12 hours then remove 12)  Januvia 50 Mg Tabs (Sitagliptin phosphate) .... Take one daily 13)  Meloxicam 15 Mg Tabs (  Meloxicam) .... One daily 14)  Furosemide 40 Mg Tabs (Furosemide) .... Take every other day 15)  Norvasc 10 Mg Tabs (Amlodipine besylate) 16)  Claritin 10 Mg Tabs (Loratadine)  Other Orders: Nutrition Referral (Nutrition)  Patient Instructions: 1)  Come back in one month 2)  record your blood sugars each morning in a log book and bring them with you 3)  go to the diabetes education referral.  they will call you  Prevention & Chronic Care Immunizations   Influenza vaccine: Not documented    Tetanus booster: Not documented   Tetanus booster due: 07/17/2009    Pneumococcal vaccine: Not documented  Other Screening   Pap smear: Not documented   Pap smear action/deferral: Not indicated S/P hysterectomy  (06/05/2009)    Mammogram: Not documented   Mammogram action/deferral: Deferred  (06/05/2009)   Mammogram due: 06/10/2010   Smoking status: current  (08/05/2009)   Smoking cessation counseling: yes  (06/05/2009)   Target quit date: 09/18/2009  (06/05/2009)  Diabetes Mellitus   HgbA1C: 5.7  (07/15/2009)   Hemoglobin A1C due: 10/15/2009    Eye exam: Not documented    Foot exam: yes  (08/05/2009)   Foot exam action/deferral: Do today   High risk foot: No  (08/05/2009)   Foot care education: Done  (08/05/2009)    Urine microalbumin/creatinine ratio: Not documented  Lipids   Total Cholesterol: 160  (07/15/2009)   LDL: See Comment mg/dL  (11/91/4782)   LDL Direct: Not documented   HDL: 17  (07/15/2009)   Triglycerides: 747  (07/15/2009)    SGOT (AST): 24  (07/15/2009)   SGPT (ALT): 25  (07/15/2009)   Alkaline  phosphatase: 71  (07/15/2009)   Total bilirubin: 0.4  (07/15/2009)  Hypertension   Last Blood Pressure: 129 / 81  (08/05/2009)   Serum creatinine: 0.82  (07/15/2009)   Serum potassium 4.5  (07/15/2009)  Self-Management Support :    Diabetes self-management support: Not documented   Referred.    Hypertension self-management support: Not documented    Lipid self-management support: Not documented    Nursing Instructions: Diabetic foot exam today    Appended Document: f/u results/eo    Clinical Lists Changes  Orders: Added new Test order of [facility]- Est  Level 4 (95621) - Signed

## 2010-02-04 NOTE — Letter (Signed)
Summary: [facility] PT Referral form  [facility] PT Referral form   Imported By: Marily Memos 08/03/2009 16:48:20  _____________________________________________________________________  External Attachment:    Type:   Image     Comment:   External Document

## 2010-02-04 NOTE — Consult Note (Signed)
Summary: [facility]  [facility]   Imported By: Clydell Hakim 10/26/2009 16:44:23  _____________________________________________________________________  External Attachment:    Type:   Image     Comment:   External Document

## 2010-02-04 NOTE — Miscellaneous (Signed)
Summary: re: meds/ts  pt's husband walked into clinic and request refills of Hydroco/apap 10-650 and also generic Robaxin. Brought in empty bottles. will ask the preceptor for advise.Marland KitchenMarland KitchenArlyss Repress CMA,  January 05, 2010 9:15 AM pt's husband reports, that the pt is w/out any pain medication. [facility] would not fill her meds due to insurance problem. he needs two written rx's for the above medication. i told pt's husband, that i will ask the preceptor and that we will call him, once we have the rx's for him to pick up. dr.Kazumi Lachney is pcp and dr.Greenbush saw the pt last week to go over all the medications. see last ov. fwd. to dr.breen.Arlyss Repress CMA,  January 05, 2010 9:29 AM        Please address refills for walk-in requests to patient's PCP, or may schedule for visit to discuss. Paula Compton MD  January 05, 2010 10:29 AM \ reviewed last visit and previous notes.  Rx for pain meds per ortho.  pt will need to follow up with ortho for pain medications. Ellery Plunk MD  January 05, 2010 12:08 PM

## 2010-02-04 NOTE — Assessment & Plan Note (Signed)
Summary: confused about meds , see note from 12/229/2011/ls   Vital Signs:  Patient profile:   45 year old female Height:      64.5 inches Weight:      293.5 pounds BMI:     49.78 Pulse rate:   86 / minute BP sitting:   119 / 80  (right arm)  Vitals Entered By: Arlyss Repress CMA, (January 01, 2010 4:09 PM) CC: Medication reconcilliation Is Patient Diabetic? Yes Pain Assessment Patient in pain? yes     Location: lleft leg Intensity: 7 Onset of pain  surgery 11-22-09   Primary Care Provider:  Ellery Plunk MD  CC:  Medication reconcilliation.  History of Present Illness:    s/p L4-L5 and L5-S1 fusion, in Nov 2011, upon discharge went to [facility] for 4 weeks for rehab now confused about medications as she was initially on different doses of medications, including her anxiety medications, cymbalta, Diabetes meds etc.  Sleep still an issue- currently on Ambien  this does not help would like to discuss change in medication- Defferred to next visit   Needs a new meter and strips taking blood sugar 4x a day, needs DM supplies Needs Miralax for constipation     Pt with approx 20 medications to review  Habits & Providers  Alcohol-Tobacco-Diet     Tobacco Status: quit > 6 months     Tobacco Counseling: not to resume use of tobacco products  Current Medications (verified): 1)  Lamictal 100 Mg Tabs (Lamotrigine) .... One Tablet By Mouth Two Times A Day 2)  Cymbalta 60 Mg Cpep (Duloxetine Hcl) .... Take 2 Capsules Once A Day 3)  Alprazolam 1 Mg Tabs (Alprazolam) .... Take One in Am, One in Pm and 1.5 At Bedtime Per Psychiatry, Not Mcfp 4)  Tricor 145 Mg Tabs (Fenofibrate) .... One Tablet By Mouth Once Daily 5)  Metformin Hcl 1000 Mg Tabs (Metformin Hcl) .... One Tablet By Mouth Two Times A Day 6)  Questran 4 Gm Pack (Cholestyramine) .... Take 1 Pack Dissolved in Water or Juice Once Daily. Take At Least 1 Hour Away From Other Medications. 7)  Prilosec Otc 20 Mg Tbec  (Omeprazole Magnesium) .Marland Kitchen.. 1 By Mouth Daily For Heartburn 8)  Januvia 50 Mg Tabs (Sitagliptin Phosphate) .... Take One Daily 9)  Furosemide 40 Mg Tabs (Furosemide) .Marland Kitchen.. 1 Tablet Daily 10)  Norvasc 10 Mg Tabs (Amlodipine Besylate) 11)  Claritin 10 Mg Tabs (Loratadine) 12)  Insulin Syringe 29g X 1/2" 0.5 Ml Misc (Insulin Syringe-Needle U-100) .... Use As Directed With Novolog Insulin 13)  Zocor 20 Mg Tabs (Simvastatin) .Marland Kitchen.. 1 By Mouth Q Daily 14)  Miralax  Powd (Polyethylene Glycol 3350) .Marland Kitchen.. 1 Capfull in Clear Liquid 1-2 Times A Day As Needed For Constipation 15)  Colace 100 Mg Caps (Docusate Sodium) .Marland Kitchen.. 1 By Mouth Two Times A Day As Needed Constipation 16)  Klor-Con 10 10 Meq Cr-Tabs (Potassium Chloride) .Marland Kitchen.. 1 By Mouth Daily 17)  Risperdal 0.5 Mg Tabs (Risperidone) .Marland Kitchen.. 1 By Mouth At Bedtime 18)  Robaxin 500 Mg Tabs (Methocarbamol) .Marland Kitchen.. 1 By Mouth Every 6 Hours As Needed For Spasm Per Dr. Alveda Reasons 19)  Hydrocodone-Acetaminophen 10-650 Mg Tabs (Hydrocodone-Acetaminophen) .Marland Kitchen.. 1 By Mouth Q 4hrs As Needed Pain  Per Dr. Alveda Reasons 20)  Fish Oil- Mega Red 300mg  .... 1 Tab Daily 21)  Novolog 100 Unit/ml Soln (Insulin Aspart) .... Inject As Directed  For Sliding Scale Insulin Dispense 1 Vial 22)  Prodigy Autocode Blood Glucose W/device  Kit (Blood Glucose Monitoring Suppl) .Marland Kitchen.. 1  Meter Dx 250.00 23)  Prodigy No Coding Blood Gluc  Strp (Glucose Blood) .... Test Blood Sugar Four Times A Day 24)  Lancets  Misc (Lancets) .... Test Cbg As Directed Four Times A Day  Dx 250.00 25)  Insulin Syringe 28g X 1/2" 1 Ml Misc (Insulin Syringe-Needle U-100) .... Insulin Syringe and Needle For Novolog Vial. For Injection As Directed Dx 250.00  Allergies (verified): No Known Drug Allergies  Past History:  Past Surgical History: Tonsillectomy Cholecystectomy Appendectomy complete hysterectomy1997 EGD 2010 showing erosions Colonoscopy 2010, adenomatous polyp, repeat 2015 L4-L5 , S1- Fusion-- Dr. Alveda Reasons  Social  History: Smoking Status:  quit > 6 months  Physical Exam  General:  VS reviewed.  obese.  alert.   NAD sitting in wheel chair   Impression & Recommendations:  Problem # 1:  COUNSELING NOS (ICD-V65.40) Assessment New  45 minute visit-  with > 30 minutes spent on medication reconcillation and couseling regarding types of medications given and what they are for. Patient polypharmcy makes it very difficult for her and she was recently under treatment by two different physicians which had changed her home dosing on some medications.  I discussed each medication and its use and dosing Also gave reccomendation not to increase her pain medication at this time fear for respiratory depression with the other medications, as well as increased fall risk and somnolence, will defer to her orthopedic surgeon Regarding her psych meds- Cymbalta was increased as pt previsouly at this dose Decision made to keep risperdal at current dose Xanax was increased per the timelime below to patients previous home dose  Regarding her Insomnia- Ambien discontinued pt is on many potentially sedating medications already and with increasing her meds per below I prefer not to make to many major changes. Options at next visit Trazadone, Restoril, Clonidine  Orders: [facility]- Est  Level 4 (99214)  Complete Medication List: 1)  Lamictal 100 Mg Tabs (Lamotrigine) .... One tablet by mouth two times a day 2)  Cymbalta 60 Mg Cpep (Duloxetine hcl) .... Take 2 capsules once a day 3)  Alprazolam 1 Mg Tabs (Alprazolam) .... Take one in am, one in pm and 1.5 at bedtime per psychiatry, not mcfp 4)  Tricor 145 Mg Tabs (Fenofibrate) .... One tablet by mouth once daily 5)  Metformin Hcl 1000 Mg Tabs (Metformin hcl) .... One tablet by mouth two times a day 6)  Questran 4 Gm Pack (Cholestyramine) .... Take 1 pack dissolved in water or juice once daily. take at least 1 hour away from other medications. 7)  Prilosec Otc 20 Mg Tbec  (Omeprazole magnesium) .Marland Kitchen.. 1 by mouth daily for heartburn 8)  Januvia 50 Mg Tabs (Sitagliptin phosphate) .... Take one daily 9)  Furosemide 40 Mg Tabs (Furosemide) .Marland Kitchen.. 1 tablet daily 10)  Norvasc 10 Mg Tabs (Amlodipine besylate) 11)  Claritin 10 Mg Tabs (Loratadine) 12)  Insulin Syringe 29g X 1/2" 0.5 Ml Misc (Insulin syringe-needle u-100) .... Use as directed with novolog insulin 13)  Zocor 20 Mg Tabs (Simvastatin) .Marland Kitchen.. 1 by mouth q daily 14)  Miralax Powd (Polyethylene glycol 3350) .Marland Kitchen.. 1 capfull in clear liquid 1-2 times a day as needed for constipation 15)  Colace 100 Mg Caps (Docusate sodium) .Marland Kitchen.. 1 by mouth two times a day as needed constipation 16)  Klor-con 10 10 Meq Cr-tabs (Potassium chloride) .Marland Kitchen.. 1 by mouth daily 17)  Risperdal 0.5 Mg Tabs (Risperidone) .Marland Kitchen.. 1 by mouth at  bedtime 18)  Robaxin 500 Mg Tabs (Methocarbamol) .Marland Kitchen.. 1 by mouth every 6 hours as needed for spasm per dr. Alveda Reasons 19)  Hydrocodone-acetaminophen 10-650 Mg Tabs (Hydrocodone-acetaminophen) .Marland Kitchen.. 1 by mouth q 4hrs as needed pain  per dr. Alveda Reasons 20)  Fish Oil- Mega Red 300mg   .... 1 tab daily 21)  Novolog 100 Unit/ml Soln (Insulin aspart) .... Inject as directed  for sliding scale insulin dispense 1 vial 22)  Prodigy Autocode Blood Glucose W/device Kit (Blood glucose monitoring suppl) .Marland Kitchen.. 1  meter dx 250.00 23)  Prodigy No Coding Blood Gluc Strp (Glucose blood) .... Test blood sugar four times a day 24)  Lancets Misc (Lancets) .... Test cbg as directed four times a day  dx 250.00 25)  Insulin Syringe 28g X 1/2" 1 Ml Misc (Insulin syringe-needle u-100) .... Insulin syringe and needle for novolog vial. for injection as directed dx 250.00  Other Orders: Comp Met-[facility] (69629-52841) A1C-[facility] (32440) Direct LDL-[facility] (10272-53664)  Patient Instructions: 1)  See updated medications 2)  Changes Metformin 1000mg  two times a day 3)  Stop Questran 4)  Add Colace 100mg  two times a day  5)  Do not take the Tricor 160mg   tablets, take the 145mg  tablets 6)  For constipation use the Miralax/Peg and Stool softner Colace 1 tab twice a day as needed 7)  Take the Risperal 0.5mg  at bedtime  8)  Pain medications: Take Norco 10-650mg  every 4 hours  9)  Oxycontin Cr 15mg  1 tab twice a day  10)  Stop taking the Fish Oil 1000mg  tablets 11)  You can take the 300mg  tablet daily 12)  Stop taking the Ambien  13)  For your sliding scale- Do not take any bedtime Insulin 14)  Follow this sliding scale For meal Time only 15)  151-200=  2 units   201-250= 4 units  251-300 6 units 16)  301-350= 8 units 351-400= 10 units  > 400 call MD  17)  Increase Cymbalta to 120mg  daily starting 01/02/10 18)  Increase your Xanax to 1mg  two times a day and 1.5 at bedtime on 01/04/10 19)  Next visit in 2 weeks to discuss sleep medications  Prescriptions: INSULIN SYRINGE 28G X 1/2" 1 ML MISC (INSULIN SYRINGE-NEEDLE U-100) insulin syringe and needle for Novolog Vial. For injection as directed Dx 250.00  #100 x 6   Entered and Authorized by:   Milinda Antis MD   Signed by:   Milinda Antis MD on 01/01/2010   Method used:   Electronically to        [facility]  [phone]* (retail)       [address]       [place], [place]  [address]       Ph: [phone] or [phone]       Fax: [phone]   RxID:   [id number] LANCETS  MISC (LANCETS) Test CBG as directed four times a day  Dx 250.00  #120 x 6   Entered and Authorized by:   Milinda Antis MD   Signed by:   Milinda Antis MD on 01/01/2010   Method used:   Electronically to        [facility]  [phone]* (retail)       [address]       [place], [place]  [address]       Ph: [phone] or [phone]  Fax: [phone]   RxID:   [id number] PRODIGY NO CODING BLOOD GLUC  STRP (GLUCOSE BLOOD) Test Blood sugar four times a day  #120 x 6   Entered and Authorized by:   Milinda Antis MD   Signed by:    Milinda Antis MD on 01/01/2010   Method used:   Electronically to        [facility]  [phone]* (retail)       [address]       [place], [place]  [address]       Ph: [phone] or [phone]       Fax: [phone]   RxID:   [id number] PRODIGY AUTOCODE BLOOD GLUCOSE W/DEVICE KIT (BLOOD GLUCOSE MONITORING SUPPL) 1  meter Dx 250.00  #1 x 0   Entered and Authorized by:   Milinda Antis MD   Signed by:   Milinda Antis MD on 01/01/2010   Method used:   Electronically to        [facility]  [phone]* (retail)       [address]       [place], [place]  [address]       Ph: [phone] or [phone]       Fax: [phone]   RxID:   [id number] NOVOLOG 100 UNIT/ML SOLN (INSULIN ASPART) Inject as directed  for sliding scale insulin Dispense 1 vial  #1 x 6   Entered and Authorized by:   Milinda Antis MD   Signed by:   Milinda Antis MD on 01/01/2010   Method used:   Electronically to        [facility]  [phone]* (retail)       [address]       [place], [place]  [address]       Ph: [phone] or [phone]       Fax: [phone]   RxID:   [id number] COLACE 100 MG CAPS (DOCUSATE SODIUM) 1 by mouth two times a day as needed constipation  #60 x 6   Entered and Authorized by:   Milinda Antis MD   Signed by:   Milinda Antis MD on 01/01/2010   Method used:   Electronically to        [facility]  [phone]* (retail)       [address]       [place], [place]  [address]       Ph: [phone] or [phone]       Fax: [phone]   RxID:   [id number] MIRALAX  POWD (POLYETHYLENE GLYCOL 3350) 1 capfull in clear liquid 1-2 times a day as needed for constipation  #1 x 12   Entered and Authorized by:   Milinda Antis MD   Signed by:   Milinda Antis MD on 01/01/2010   Method used:   Electronically to        [facility]  [phone]* (retail)       [address]       [place], [place]  [address]       Ph: [phone] or [phone]       Fax: [phone]   RxID:   [id number]    Orders Added: 1)  Comp Met-[facility] [58850-27741] 2)  A1C-[facility] [83036] 3)  Direct LDL-[facility] [83721-81033] 4)  [facility]- Est  Level 4 [[address]782]    Laboratory Results   Blood Tests   Date/Time Received: January 01, 2010 4:46 PM  Date/Time Reported: January 01, 2010 5:35 PM   HGBA1C: 6.6%   (Normal Range: Non-Diabetic - 3-6%   Control Diabetic - 6-8%)  Comments: ...............test performed by......Marland KitchenBonnie A. [place]az[place], MLS (ASCP)cm

## 2010-02-04 NOTE — Assessment & Plan Note (Signed)
Summary: f/u,results/tlb   Allergies: No Known Drug Allergies   Complete Medication List: 1)  Lithium Carbonate 300 Mg Tabs (Lithium carbonate) .... 3 tablets by mouth qhs 2)  Lamictal 100 Mg Tabs (Lamotrigine) .... One tablet by mouth two times a day 3)  Cymbalta 60 Mg Cpep (Duloxetine hcl) .... One tablet by mouth once daily 4)  Alprazolam 1 Mg Tabs (Alprazolam) .... Take one in am, one in pm and 1.5 at bedtime per psychiatry, not mcfp 5)  Pravastatin Sodium 40 Mg Tabs (Pravastatin sodium) .... One tablet by mouth once daily 6)  Lisinopril-hydrochlorothiazide 20-12.5 Mg Tabs (Lisinopril-hydrochlorothiazide) .... One tablet by mouth once daily 7)  Tricor 145 Mg Tabs (Fenofibrate) .... One tablet by mouth once daily 8)  Metformin Hcl 1000 Mg Tabs (Metformin hcl) .... One tablet by mouth two times a day 9)  Questran 4 Gm Pack (Cholestyramine) .... Take 1 pack dissolved in water or juice once daily. take at least 1 hour away from other medications. 10)  Nexium 40 Mg Cpdr (Esomeprazole magnesium) .... Take 1 tablet by mouth once a day 11)  Lidoderm 5 % Ptch (Lidocaine) .... Place at site of pain (up and down back) for 12 hours then remove 12)  Januvia 50 Mg Tabs (Sitagliptin phosphate) .... Take one daily 13)  Meloxicam 15 Mg Tabs (Meloxicam) .... One daily 14)  Furosemide 40 Mg Tabs (Furosemide) .... Take every other day 15)  Norvasc 10 Mg Tabs (Amlodipine besylate) 16)  Claritin 10 Mg Tabs (Loratadine)  Other Orders: No Charge Patient Arrived (NCPA0) (NCPA0)

## 2010-02-04 NOTE — Assessment & Plan Note (Signed)
Summary: NP,TCB   Vital Signs:  Patient profile:   45 year old female Height:      64.5 inches Weight:      305.1 pounds BMI:     51.75 Temp:     98.5 degrees F Pulse rate:   98 / minute BP sitting:   136 / 88  (right arm)  Vitals Entered By: Starleen Blue RN (June 05, 2009 9:11 AM) CC: np Is Patient Diabetic? Yes Pain Assessment Patient in pain? yes     Location: back Intensity: 6   Primary Care Provider:  Ellery Plunk MD  CC:  np.  History of Present Illness: weight- reports weight has doubled in the last 10 years, poor diet, no exercise  back pain- for the last 15 years, has had some imaging indicating arthritic changes.  she has trouble walking distances.  has tried tramadol and percocet which havent helped.  tremors- started several months ago. no change in meds though her psychiatrist has recently lowered her dose of lithium.    "Falling out"- has spells where when she is concentrating hard, she falls to the floor, can hear but can't move.  Husband reports they have been told these are silent seizures.  The only thing that works to get her out of them is  to talk to her and they end in a few minutes.  Depression/anxiety- has worked with a Paramedic for several years, just finished.  had a break down where she gave up and stayed in bed for months with SI.  reports no SI/HI now.    DM-usually CBGs of <200.  checks each AM.  on januvia and metformin  Habits & Providers  Alcohol-Tobacco-Diet     Alcohol drinks/day: 0     Tobacco Status: current     Tobacco Counseling: to quit use of tobacco products     Cigarette Packs/Day: 1.0     Diet Comments: poor     Diet Counseling: to improve diet; diet is suboptimal  Exercise-Depression-Behavior     Does Patient Exercise: no     Exercise Counseling: to improve exercise regimen     STD Risk: never     STD Risk Counseling: not indicated-no STD risk noted     Drug Use: never     Seat Belt Use: always     Sun  Exposure: rarely  Current Medications (verified): 1)  Lithium Carbonate 300 Mg Tabs (Lithium Carbonate) .... 3 Tablets By Mouth Qhs 2)  Lamictal 100 Mg Tabs (Lamotrigine) .... One Tablet By Mouth Two Times A Day 3)  Cymbalta 60 Mg Cpep (Duloxetine Hcl) .... One Tablet By Mouth Once Daily 4)  Alprazolam 1 Mg Tabs (Alprazolam) .... Take One in Am, One in Pm and 1.5 At Bedtime Per Psychiatry, Not Mcfp 5)  Pravastatin Sodium 40 Mg Tabs (Pravastatin Sodium) .... One Tablet By Mouth Once Daily 6)  Lisinopril-Hydrochlorothiazide 20-12.5 Mg Tabs (Lisinopril-Hydrochlorothiazide) .... One Tablet By Mouth Once Daily 7)  Tricor 145 Mg Tabs (Fenofibrate) .... One Tablet By Mouth Once Daily 8)  Metformin Hcl 1000 Mg Tabs (Metformin Hcl) .... One Tablet By Mouth Two Times A Day 9)  Questran 4 Gm Pack (Cholestyramine) .... Take 1 Pack Dissolved in Water or Juice Once Daily. Take At Least 1 Hour Away From Other Medications. 10)  Nexium 40 Mg Cpdr (Esomeprazole Magnesium) .... Take 1 Tablet By Mouth Once A Day 11)  Lidoderm 5 % Ptch (Lidocaine) .... Place At Site of Pain (Up and  Down Back) For 12 Hours Then Remove 12)  Januvia 50 Mg Tabs (Sitagliptin Phosphate) .... Take One Daily 13)  Meloxicam 15 Mg Tabs (Meloxicam) .... One Daily 14)  Furosemide 40 Mg Tabs (Furosemide) .... Take Every Other Day 15)  Norvasc 10 Mg Tabs (Amlodipine Besylate) 16)  Claritin 10 Mg Tabs (Loratadine)  Allergies (verified): No Known Drug Allergies  Past History:  Past Medical History: Current Problems:  OBESITY (ICD-278.00) HYPERTENSION (ICD-401.9) HX OF GALLSTONE (ICD-V12.79) DEPRESSION (ICD-311) HEADACHE, CHRONIC (ICD-784.0) ANXIETY (ICD-300.00) COLONIC POLYPS, HYPERPLASTIC, HX OF (ICD-V12.72) HYPERLIPIDEMIA (ICD-272.4) DIABETES MELLITUS (ICD-250.00) IRRITABLE BOWEL SYNDROME (ICD-564.1) panic attacks insomnia tremor "short term memory loss"    Past Surgical  History: Tonsillectomy Cholecystectomy Appendectomy complete hysterectomy1997 EGD 2010 showing erosions Colonoscopy 2010, adenomatous polyp, repeat 2015  Family History: Family History of Colon Polyps: Mother No FH of Colon Cancer: Father and uncles with heart disease  Social History: Illicit Drug Use - no Married, husband on disability Patient currently smokes 1 ppd.  Alcohol Use - no Packs/Day:  1.0 Does Patient Exercise:  no STD Risk:  never Drug Use:  never Seat Belt Use:  always Sun Exposure-Excessive:  rarely  Review of Systems       The patient complains of weight gain, dyspnea on exertion, difficulty walking, and depression.  The patient denies fever, chest pain, syncope, and abdominal pain.    Physical Exam  General:  obese, mostly central. appears older than stated age Head:  thin hair Eyes:  No corneal or conjunctival inflammation noted. EOMI. Perrla. Vision grossly normal. Ears:  External ear exam shows no significant lesions or deformities.  Otoscopic examination reveals clear canals, tympanic membranes are intact bilaterally without bulging, retraction, inflammation or discharge. Hearing is grossly normal bilaterally. Nose:  External nasal examination shows no deformity or inflammation. Nasal mucosa are pink and moist without lesions or exudates. Mouth:  Oral mucosa and oropharynx without lesions or exudates.  Lungs:  Normal respiratory effort, chest expands symmetrically. Lungs are clear to auscultation, no crackles or wheezes. Heart:  Normal rate and regular rhythm. S1 and S2 normal without gallop, murmur, click, rub or other extra sounds. Abdomen:  exam limited by obesity Msk:  normal ROM.  Gait fairly normal over short distance, no limp Extremities:  trace edema, dry scaling skin with some erythema   Impression & Recommendations:  Problem # 1:  OBESITY (ICD-278.00) Assessment Unchanged discussed diet and exercise.  set goals.  see back 1 month.    Problem # 2:  HYPERTENSION (ICD-401.9) not at goal considering DM.  Will follow, consider changes to meds at next visit.  she says that she is nervous today.  check CMET. Her updated medication list for this problem includes:    Lisinopril-hydrochlorothiazide 20-12.5 Mg Tabs (Lisinopril-hydrochlorothiazide) ..... One tablet by mouth once daily    Furosemide 40 Mg Tabs (Furosemide) .Marland Kitchen... Take every other day    Norvasc 10 Mg Tabs (Amlodipine besylate)  Future Orders: Comp Met[facility] ([id number]) ... 06/23/2010  Problem # 3:  DEPRESSION (ICD-311) Assessment: Unchanged see psychiatry.  no SI/HI.  xanax and lithium and cymbalta per psych.  discussed need to wean xanax if she stops.   Her updated medication list for this problem includes:    Cymbalta 60 Mg Cpep (Duloxetine hcl) ..... One tablet by mouth once daily    Alprazolam 1 Mg Tabs (Alprazolam) .Marland Kitchen... Take one in am, one in pm and 1.5 at bedtime per psychiatry, not mcfp  Future Orders: TSH[facility] ([id number]) ... 06/10/2010  Problem # 4:  DIABETES MELLITUS (ICD-250.00) Assessment: Unchanged leave meds as is for now.  checked meter.  very few CBG over 200.  get A1c at next visit.  go from there Her updated medication list for this problem includes:    Lisinopril-hydrochlorothiazide 20-12.5 Mg Tabs (Lisinopril-hydrochlorothiazide) ..... One tablet by mouth once daily    Metformin Hcl 1000 Mg Tabs (Metformin hcl) ..... One tablet by mouth two times a day    Januvia 50 Mg Tabs (Sitagliptin phosphate) .Marland Kitchen... Take one daily  Future Orders: Lipid[facility] ([id number]) ... 06/10/2010 A1C[facility] ([id number]) ... 06/17/2010  Problem # 5:  BACK PAIN, CHRONIC (ICD-724.5) Assessment: Unchanged wants an "instant fix"  not willing to try PT.  will try lidocaine patches.  discussed weight loss being very important.  this problem is chronic for many years. pt has an external locus of control, not really seeing how her actions affect her health Her updated  medication list for this problem includes:    Meloxicam 15 Mg Tabs (Meloxicam) ..... One daily  Complete Medication List: 1)  Lithium Carbonate 300 Mg Tabs (Lithium carbonate) .... 3 tablets by mouth qhs 2)  Lamictal 100 Mg Tabs (Lamotrigine) .... One tablet by mouth two times a day 3)  Cymbalta 60 Mg Cpep (Duloxetine hcl) .... One tablet by mouth once daily 4)  Alprazolam 1 Mg Tabs (Alprazolam) .... Take one in am, one in pm and 1.5 at bedtime per psychiatry, not mcfp 5)  Pravastatin Sodium 40 Mg Tabs (Pravastatin sodium) .... One tablet by mouth once daily 6)  Lisinopril-hydrochlorothiazide 20-12.5 Mg Tabs (Lisinopril-hydrochlorothiazide) .... One tablet by mouth once daily 7)  Tricor 145 Mg Tabs (Fenofibrate) .... One tablet by mouth once daily 8)  Metformin Hcl 1000 Mg Tabs (Metformin hcl) .... One tablet by mouth two times a day 9)  Questran 4 Gm Pack (Cholestyramine) .... Take 1 pack dissolved in water or juice once daily. take at least 1 hour away from other medications. 10)  Nexium 40 Mg Cpdr (Esomeprazole magnesium) .... Take 1 tablet by mouth once a day 11)  Lidoderm 5 % Ptch (Lidocaine) .... Place at site of pain (up and down back) for 12 hours then remove 12)  Januvia 50 Mg Tabs (Sitagliptin phosphate) .... Take one daily 13)  Meloxicam 15 Mg Tabs (Meloxicam) .... One daily 14)  Furosemide 40 Mg Tabs (Furosemide) .... Take every other day 15)  Norvasc 10 Mg Tabs (Amlodipine besylate) 16)  Claritin 10 Mg Tabs (Loratadine)  Other Orders: Future Orders: CBC[facility] ([id number]) ... 06/04/2010  Patient Instructions: 1)  Goals: eating 6 times/day, drinking 6-8 glasses a day 2)  20 min/day in the pool or bike 3)  currently 1 pack per day.  In 3 months will have quit smoking.  Go to the smoking cessation classes 4)  make a lab appt for sometime before your next visit. 5)  come back to see me in 4-6 weeks. Prescriptions: JANUVIA 50 MG TABS (SITAGLIPTIN PHOSPHATE) take one daily  #30 x 0    Entered and Authorized by:   Ellery Plunk MD   Signed by:   Ellery Plunk MD on 06/05/2009   Method used:   Historical   RxID:   [id number] LIDODERM 5 % PTCH (LIDOCAINE) place at site of pain (up and down back) for 12 hours then remove  #30 x 3   Entered and Authorized by:   Ellery Plunk MD   Signed by:   Ellery Plunk MD on 06/05/2009  Method used:   Print then Give to Patient   RxID:   [id number]   Prevention & Chronic Care Immunizations   Influenza vaccine: Not documented    Tetanus booster: Not documented   Tetanus booster due: 07/17/2009    Pneumococcal vaccine: Not documented  Other Screening   Pap smear: Not documented   Pap smear action/deferral: Not indicated S/P hysterectomy  (06/05/2009)    Mammogram: Not documented   Mammogram action/deferral: Deferred  (06/05/2009)   Mammogram due: 06/10/2010   Smoking status: current  (06/05/2009)   Smoking cessation counseling: yes  (06/05/2009)   Target quit date: 09/18/2009  (06/05/2009)  Diabetes Mellitus   HgbA1C: Not documented   Hemoglobin A1C due: 07/23/2009    Eye exam: Not documented    Foot exam: Not documented   High risk foot: Not documented   Foot care education: Not documented    Urine microalbumin/creatinine ratio: Not documented  Lipids   Total Cholesterol: Not documented   LDL: Not documented   LDL Direct: Not documented   HDL: Not documented   Triglycerides: Not documented    SGOT (AST): 39  (04/21/2008)   SGPT (ALT): 33  (04/21/2008) CMP ordered    Alkaline phosphatase: 77  (04/21/2008)   Total bilirubin: 0.7  (04/21/2008)  Hypertension   Last Blood Pressure: 136 / 88  (06/05/2009)   Serum creatinine: Not documented   Serum potassium Not documented CMP ordered   Self-Management Support :    Diabetes self-management support: Not documented    Hypertension self-management support: Not documented    Lipid self-management support: Not documented

## 2010-02-05 NOTE — Miscellaneous (Signed)
Summary: ROI  ROI   Imported By: Bradly Bienenstock 08/05/2009 12:57:13  _____________________________________________________________________  External Attachment:    Type:   Image     Comment:   External Document

## 2010-02-10 ENCOUNTER — Emergency Department (HOSPITAL_COMMUNITY): Payer: Self-pay

## 2010-02-10 ENCOUNTER — Emergency Department (HOSPITAL_COMMUNITY)
Admission: EM | Admit: 2010-02-10 | Discharge: 2010-02-12 | Disposition: A | Discharge status: Other Acute Inpatient Hospital | Payer: Self-pay | Attending: Emergency Medicine | Admitting: Emergency Medicine

## 2010-02-10 DIAGNOSIS — I10 Essential (primary) hypertension: Secondary | ICD-10-CM | POA: Insufficient documentation

## 2010-02-10 DIAGNOSIS — E119 Type 2 diabetes mellitus without complications: Secondary | ICD-10-CM | POA: Insufficient documentation

## 2010-02-10 DIAGNOSIS — F319 Bipolar disorder, unspecified: Secondary | ICD-10-CM | POA: Insufficient documentation

## 2010-02-10 LAB — CBC
HCT: 38.7 % (ref 36.0–46.0)
Hemoglobin: 13.6 g/dL (ref 12.0–15.0)
MCV: 86.6 fL (ref 78.0–100.0)
RBC: 4.47 MIL/uL (ref 3.87–5.11)
WBC: 8.3 10*3/uL (ref 4.0–10.5)

## 2010-02-10 LAB — URINALYSIS, ROUTINE W REFLEX MICROSCOPIC
Nitrite: NEGATIVE
Protein, ur: NEGATIVE mg/dL
Urine Glucose, Fasting: NEGATIVE mg/dL
pH: 7 (ref 5.0–8.0)

## 2010-02-10 LAB — DIFFERENTIAL
Basophils Absolute: 0.1 10*3/uL (ref 0.0–0.1)
Eosinophils Relative: 2 % (ref 0–5)
Lymphocytes Relative: 30 % (ref 12–46)
Monocytes Relative: 6 % (ref 3–12)

## 2010-02-10 LAB — BASIC METABOLIC PANEL
CO2: 25 mEq/L (ref 19–32)
Chloride: 94 mEq/L — ABNORMAL LOW (ref 96–112)
Glucose, Bld: 292 mg/dL — ABNORMAL HIGH (ref 70–99)
Potassium: 4 mEq/L (ref 3.5–5.1)
Sodium: 134 mEq/L — ABNORMAL LOW (ref 135–145)

## 2010-02-10 LAB — GLUCOSE, CAPILLARY

## 2010-02-10 LAB — POCT CARDIAC MARKERS: Troponin i, poc: <0.05 ng/mL (ref 0.00–0.09)

## 2010-02-10 LAB — RAPID URINE DRUG SCREEN, HOSP PERFORMED
Barbiturates: NOT DETECTED
Opiates: POSITIVE — AB

## 2010-02-11 DIAGNOSIS — F39 Unspecified mood [affective] disorder: Secondary | ICD-10-CM

## 2010-02-11 LAB — GLUCOSE, CAPILLARY
Glucose-Capillary: 236 mg/dL — ABNORMAL HIGH (ref 70–99)
Glucose-Capillary: 243 mg/dL — ABNORMAL HIGH (ref 70–99)
Glucose-Capillary: 249 mg/dL — ABNORMAL HIGH (ref 70–99)

## 2010-02-11 LAB — HEMOGLOBIN A1C: Hgb A1c MFr Bld: 7.7 % — ABNORMAL HIGH (ref <5.7)

## 2010-02-12 ENCOUNTER — Inpatient Hospital Stay (HOSPITAL_COMMUNITY)
Admission: RE | Admit: 2010-02-12 | Discharge: 2010-02-15 | DRG: 885 | Disposition: A | Discharge status: Home / Self Care | Payer: Self-pay | Source: Ambulatory Visit | Attending: Psychiatry | Admitting: Psychiatry

## 2010-02-12 DIAGNOSIS — IMO0002 Reserved for concepts with insufficient information to code with codable children: Secondary | ICD-10-CM

## 2010-02-12 DIAGNOSIS — I10 Essential (primary) hypertension: Secondary | ICD-10-CM

## 2010-02-12 DIAGNOSIS — Z794 Long term (current) use of insulin: Secondary | ICD-10-CM

## 2010-02-12 DIAGNOSIS — E785 Hyperlipidemia, unspecified: Secondary | ICD-10-CM

## 2010-02-12 DIAGNOSIS — R45851 Suicidal ideations: Secondary | ICD-10-CM

## 2010-02-12 DIAGNOSIS — F319 Bipolar disorder, unspecified: Principal | ICD-10-CM

## 2010-02-12 DIAGNOSIS — L02828 Furuncle of other sites: Secondary | ICD-10-CM

## 2010-02-12 DIAGNOSIS — K589 Irritable bowel syndrome without diarrhea: Secondary | ICD-10-CM

## 2010-02-12 DIAGNOSIS — E119 Type 2 diabetes mellitus without complications: Secondary | ICD-10-CM

## 2010-02-12 LAB — GLUCOSE, CAPILLARY
Glucose-Capillary: 220 mg/dL — ABNORMAL HIGH (ref 70–99)
Glucose-Capillary: 174 mg/dL — ABNORMAL HIGH (ref 70–99)
Glucose-Capillary: 290 mg/dL — ABNORMAL HIGH (ref 70–99)

## 2010-02-13 DIAGNOSIS — F39 Unspecified mood [affective] disorder: Secondary | ICD-10-CM

## 2010-02-13 LAB — GLUCOSE, CAPILLARY
Glucose-Capillary: 222 mg/dL — ABNORMAL HIGH (ref 70–99)
Glucose-Capillary: 280 mg/dL — ABNORMAL HIGH (ref 70–99)
Glucose-Capillary: 189 mg/dL — ABNORMAL HIGH (ref 70–99)

## 2010-02-14 LAB — GLUCOSE, CAPILLARY: Glucose-Capillary: 216 mg/dL — ABNORMAL HIGH (ref 70–99)

## 2010-02-15 LAB — GLUCOSE, CAPILLARY
Glucose-Capillary: 203 mg/dL — ABNORMAL HIGH (ref 70–99)
Glucose-Capillary: 235 mg/dL — ABNORMAL HIGH (ref 70–99)
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
## 2010-02-16 ENCOUNTER — Ambulatory Visit: Payer: Self-pay | Admitting: Family Medicine

## 2010-02-16 LAB — CULTURE, ROUTINE-ABSCESS: Gram Stain: NONE SEEN

## 2010-02-18 ENCOUNTER — Ambulatory Visit (INDEPENDENT_AMBULATORY_CARE_PROVIDER_SITE_OTHER): Payer: Self-pay | Admitting: Family Medicine

## 2010-02-18 ENCOUNTER — Encounter: Payer: Self-pay | Admitting: Family Medicine

## 2010-02-18 VITALS — BP 136/95 | HR 106 | Temp 98.5°F | Ht 63.0 in | Wt 295.6 lb

## 2010-02-18 DIAGNOSIS — E119 Type 2 diabetes mellitus without complications: Secondary | ICD-10-CM

## 2010-02-18 DIAGNOSIS — M549 Dorsalgia, unspecified: Secondary | ICD-10-CM

## 2010-02-18 DIAGNOSIS — I10 Essential (primary) hypertension: Secondary | ICD-10-CM

## 2010-02-18 DIAGNOSIS — F319 Bipolar disorder, unspecified: Secondary | ICD-10-CM

## 2010-02-18 MED ORDER — POTASSIUM CHLORIDE 10 MEQ PO TBCR: 20.0000 meq | EXTENDED_RELEASE_TABLET | Freq: Every day | ORAL | Status: DC

## 2010-02-18 MED ORDER — OMEPRAZOLE 20 MG PO CPDR: 20.0000 mg | DELAYED_RELEASE_CAPSULE | Freq: Every day | ORAL | Status: DC

## 2010-02-18 MED ORDER — HYDROCHLOROTHIAZIDE 12.5 MG PO CAPS: 12.5000 mg | ORAL_CAPSULE | Freq: Every day | ORAL | Status: DC

## 2010-02-18 MED ORDER — SITAGLIPTIN PHOSPHATE 50 MG PO TABS: 50.0000 mg | ORAL_TABLET | Freq: Every day | ORAL | Status: DC

## 2010-02-18 MED ORDER — DOCUSATE SODIUM 100 MG PO CAPS: 100.0000 mg | ORAL_CAPSULE | Freq: Two times a day (BID) | ORAL | Status: DC

## 2010-02-18 MED ORDER — FENOFIBRATE 145 MG PO TABS: 145.0000 mg | ORAL_TABLET | Freq: Every day | ORAL | Status: DC

## 2010-02-18 MED ORDER — SIMVASTATIN 20 MG PO TABS: 20.0000 mg | ORAL_TABLET | Freq: Every day | ORAL | Status: DC

## 2010-02-18 MED ORDER — METFORMIN HCL 1000 MG PO TABS: 1000.0000 mg | ORAL_TABLET | Freq: Two times a day (BID) | ORAL | Status: DC

## 2010-02-18 MED ORDER — FUROSEMIDE 40 MG PO TABS: 40.0000 mg | ORAL_TABLET | Freq: Every day | ORAL | Status: DC

## 2010-02-18 NOTE — Assessment & Plan Note (Signed)
Pt has been off HTN meds for the last few days.  BP pretty good. Will start HCTZ back but hold off on other meds at this time.  RTC in 2 weeks for BP check and recheck K and Cr.

## 2010-02-18 NOTE — Discharge Summary (Signed)
  NAME:  Christine Bradshaw, Christine Bradshaw           ACCOUNT NO.:  [id number]  MEDICAL RECORD NO.:  [id number]           PATIENT TYPE:  I  LOCATION:  0506                          FACILITY:  [facility]  PHYSICIAN:  Marlis Edelson, DO        DATE OF BIRTH:  [birth year]  DATE OF ADMISSION:  02/12/2010 DATE OF DISCHARGE:                              DISCHARGE SUMMARY   FINAL DIAGNOSIS:  Bipolar disorder.  PROCEDURES PERFORMED:  None.  REASON FOR ADMISSION:  This is a 45 year old female who presented to the Emergency Room, referred by her outside psychiatrist, as the patient was having mood changes, feeling angry, having suicidal thoughts, and wanting to jump out of her car.  The patient reported having a history of a bipolar disorder.\  PERTINENT LABS:  Sodium of 134, chloride of 94.  Her glucose was elevated at 292; urine drug screen positive for benzodiazepines, positive for opiates.  Her CBC was within normal limits.  Urinalysis was negative.  Hemoglobin A1c of 7.7.  HOSPITAL COURSE:  The patient was admitted to the [facility]; participated in groups.  We monitored her blood sugars, and we added Risperdal for mood stabilization.  The patient initially had poor sleep, having racing thoughts, but her sleep improved on Risperdal and on the day prior to discharge, the patient stated she had the best night's sleep possible.  The patient's mood became more stable.  We had contact with her husband.  Her affect improved.  Her thinking was logical and goal-directed, denying any suicidal thoughts.  We continued to monitor her blood sugars and administered sliding scale as needed for abnormal blood sugars.  CONDITION OF THE PATIENT ON DISCHARGE:  The patient was fully alert and cooperative, very open, bright, smiling; felt that she was ready to go home; asked appropriate questions.  She showed no signs of hypomania, mania, overt anxiety, or psychotic symptoms.  We have provided educational materials  for her history of a bipolar disorder.  The patient was encouraged to follow up with her primary care provider in regards to her diabetes; reviewed compliance with her medications.  Her discharge medications include Klonopin 1/2 mg t.i.d., Risperdal 2 mg q.h.s., trazodone 100 mg q.h.s. p.r.n. sleep, Allegra 1 tab daily, cholestyramine 4 grams daily as needed, Cymbalta 60 mg two q.a.m., fentanyl patch 25 mg every 3 days, Lasix 40 mg one daily, hydrocodone 10/325 one to two tabs q.4h. p.r.n., Januvia one tab daily, Lamictal 100 mg twice daily, metformin 1,000 mg twice daily, MiraLAX 17 grams daily as needed, potassium chloride 10 mEq daily, pravastatin 40 mg daily, Prilosec one daily, TriCor 145 mg daily. Her discharge appointments are with Valinda Hoar, N.P., on Wednesday at 2-5, phone number [phone].  The patient was discharged to home.     Landry Corporal, N.P.   ______________________________ Marlis Edelson, DO    JO/MEDQ  D:  02/15/2010  T:  02/15/2010  Job:  [id number]  Electronically Signed by Limmie PatriciaP. on 02/17/2010 01:46:24 PM Electronically Signed by Marlis Edelson MD on 02/18/2010 08:38:27 PM

## 2010-02-18 NOTE — Progress Notes (Signed)
  Subjective:    Patient ID: Christine Bradshaw, female    DOB: 1965-12-21, 45 y.o.   MRN: [id number]  HPI Psych- recent admission to behavioral health.  Change in medications.  Stopped lithium, started risperdal, changed xanax to klonipin.    DM- taking metformin and januvia.  CBGs in the 200s at home.  No symptoms of low BS.    Back pain- p/o x 1 month from spinal fusion, pain controlled on narcotics written by ortho.  Finished inpt rehab, doing exercises at home.  Has had LE edema since surgery since she is not walking much.  Taking lasix.   ROS- denies increasing weakness, decreased sensations, CP, HA, changes in vision   Review of Systems     Objective:   Physical Exam  Constitutional: She is oriented to person, place, and time. She appears well-developed and well-nourished.  HENT:  Head: Normocephalic and atraumatic.  Neck: Normal range of motion. Neck supple.  Cardiovascular: Normal rate and regular rhythm.   Pulmonary/Chest: Effort normal and breath sounds normal.  Abdominal: Soft. Bowel sounds are normal.  Musculoskeletal: Normal range of motion. She exhibits edema. She exhibits no tenderness.  Neurological: She is alert and oriented to person, place, and time. She displays normal reflexes. She exhibits normal muscle tone. Coordination normal.  Skin: Skin is warm and dry.       Feet with dry flaking skin          Assessment & Plan:

## 2010-02-18 NOTE — Assessment & Plan Note (Signed)
S/p lumbar fusion.  Pain meds per ortho.

## 2010-02-18 NOTE — Assessment & Plan Note (Signed)
Pt with recent admission to mental health.  Reviewed tx.  Pt is still following with her psychiatrist.  Updated medicines

## 2010-02-18 NOTE — Assessment & Plan Note (Signed)
a1c not due until march.  CBGs have been slightly high but pt has been in hospital and recovering from surgery.  Will keep meds same since last A1c was good.  Recheck in march and go from there.  Pt should be better able to increase activity then.

## 2010-02-18 NOTE — Patient Instructions (Signed)
Please take the HCTZ for blood pressure Take your blood pressure at home a few times and bring that to your next visit Come back in 2 weeks to recheck blood pressure and recheck lab work

## 2010-02-19 LAB — COMPREHENSIVE METABOLIC PANEL
ALT: 30 U/L (ref 0–35)
AST: 27 U/L (ref 0–37)
Albumin: 4.5 g/dL (ref 3.5–5.2)
Alkaline Phosphatase: 79 U/L (ref 39–117)
BUN: 15 mg/dL (ref 6–23)
Calcium: 9.4 mg/dL (ref 8.4–10.5)
Chloride: 95 mEq/L — ABNORMAL LOW (ref 96–112)
Potassium: 4.3 mEq/L (ref 3.5–5.3)
Sodium: 136 mEq/L (ref 135–145)
Total Protein: 7 g/dL (ref 6.0–8.3)

## 2010-03-10 ENCOUNTER — Ambulatory Visit (INDEPENDENT_AMBULATORY_CARE_PROVIDER_SITE_OTHER): Payer: Self-pay | Admitting: Family Medicine

## 2010-03-10 VITALS — BP 115/82 | HR 102 | Temp 98.1°F | Ht 64.0 in | Wt 290.0 lb

## 2010-03-10 DIAGNOSIS — R600 Localized edema: Secondary | ICD-10-CM

## 2010-03-10 DIAGNOSIS — R609 Edema, unspecified: Secondary | ICD-10-CM

## 2010-03-10 DIAGNOSIS — E1140 Type 2 diabetes mellitus with diabetic neuropathy, unspecified: Secondary | ICD-10-CM | POA: Insufficient documentation

## 2010-03-10 DIAGNOSIS — E1142 Type 2 diabetes mellitus with diabetic polyneuropathy: Secondary | ICD-10-CM

## 2010-03-10 DIAGNOSIS — I10 Essential (primary) hypertension: Secondary | ICD-10-CM

## 2010-03-10 DIAGNOSIS — E1149 Type 2 diabetes mellitus with other diabetic neurological complication: Secondary | ICD-10-CM

## 2010-03-10 MED ORDER — GABAPENTIN 300 MG PO CAPS: 300.0000 mg | ORAL_CAPSULE | Freq: Three times a day (TID) | ORAL | Status: DC

## 2010-03-10 NOTE — Patient Instructions (Signed)
It was nice to see you today I am glad your blood pressure is doing better Try taking your lasix first thing in the AM and then 6 hours later If this helps, we can continue this and I will resend a prescription Please make an appt with Dr. Raymondo Band for evaluation of your blood flow (ABI)

## 2010-03-11 DIAGNOSIS — I872 Venous insufficiency (chronic) (peripheral): Secondary | ICD-10-CM | POA: Insufficient documentation

## 2010-03-11 NOTE — Assessment & Plan Note (Signed)
Gabapentin started today for neuropathy.  Will titrate as needed.

## 2010-03-11 NOTE — Assessment & Plan Note (Addendum)
BPs better today.  Continue current regimen.

## 2010-03-11 NOTE — Assessment & Plan Note (Signed)
Palpable pulses but with color changes and pain with walking, will send to Dr. Raymondo Band for ABIs.  Will increase lasix to BID.  Consider compression hose if ABIs ok

## 2010-03-11 NOTE — Progress Notes (Signed)
  Subjective:    Patient ID: Christine Bradshaw, female    DOB: [birth year], 45 y.o.   MRN: [id number]  HPI BP- under better control, taking meds as prescribed.  No CP, no palpitations.  Walking more with PT and walker.    Leg edema-  Swelling, some increasing redness in feet.  Dry cracked feet.  Taking lasix 40 q day, used to help, now not as effective.   Has some pain in her calves with walking, but hard to tell since she does not walk far due to back surgery, PT.  Has some compression hose at home but does not use them.    Review of Systems See above.  Denies HA, SOB    Objective:   Physical Exam Vital signs reviewed General appearance - alert, well appearing, and in no distress and oriented to person, place, and time Chest - clear to auscultation, no wheezes, rales or rhonchi, symmetric air entry, no tachypnea, retractions or cyanosis Heart - normal rate, regular rhythm, normal S1, S2, no murmurs, rubs, clicks or gallops Ext- dry cracked feet.  Reddened feet to ankles, but palpable pulses in DP.  Feet and legs appear full, but non pitting       Assessment & Plan:

## 2010-03-16 ENCOUNTER — Ambulatory Visit (INDEPENDENT_AMBULATORY_CARE_PROVIDER_SITE_OTHER): Payer: Self-pay | Admitting: Pharmacist

## 2010-03-16 DIAGNOSIS — E1149 Type 2 diabetes mellitus with other diabetic neurological complication: Secondary | ICD-10-CM

## 2010-03-16 DIAGNOSIS — E1140 Type 2 diabetes mellitus with diabetic neuropathy, unspecified: Secondary | ICD-10-CM

## 2010-03-16 DIAGNOSIS — E1142 Type 2 diabetes mellitus with diabetic polyneuropathy: Secondary | ICD-10-CM

## 2010-03-16 LAB — GLUCOSE, CAPILLARY
Glucose-Capillary: 112 mg/dL — ABNORMAL HIGH (ref 70–99)
Glucose-Capillary: 112 mg/dL — ABNORMAL HIGH (ref 70–99)
Glucose-Capillary: 115 mg/dL — ABNORMAL HIGH (ref 70–99)
Glucose-Capillary: 115 mg/dL — ABNORMAL HIGH (ref 70–99)
Glucose-Capillary: 115 mg/dL — ABNORMAL HIGH (ref 70–99)
Glucose-Capillary: 117 mg/dL — ABNORMAL HIGH (ref 70–99)
Glucose-Capillary: 121 mg/dL — ABNORMAL HIGH (ref 70–99)
Glucose-Capillary: 121 mg/dL — ABNORMAL HIGH (ref 70–99)
Glucose-Capillary: 122 mg/dL — ABNORMAL HIGH (ref 70–99)
Glucose-Capillary: 123 mg/dL — ABNORMAL HIGH (ref 70–99)
Glucose-Capillary: 125 mg/dL — ABNORMAL HIGH (ref 70–99)
Glucose-Capillary: 126 mg/dL — ABNORMAL HIGH (ref 70–99)
Glucose-Capillary: 128 mg/dL — ABNORMAL HIGH (ref 70–99)
Glucose-Capillary: 128 mg/dL — ABNORMAL HIGH (ref 70–99)
Glucose-Capillary: 131 mg/dL — ABNORMAL HIGH (ref 70–99)
Glucose-Capillary: 132 mg/dL — ABNORMAL HIGH (ref 70–99)
Glucose-Capillary: 134 mg/dL — ABNORMAL HIGH (ref 70–99)
Glucose-Capillary: 134 mg/dL — ABNORMAL HIGH (ref 70–99)
Glucose-Capillary: 136 mg/dL — ABNORMAL HIGH (ref 70–99)
Glucose-Capillary: 137 mg/dL — ABNORMAL HIGH (ref 70–99)
Glucose-Capillary: 139 mg/dL — ABNORMAL HIGH (ref 70–99)
Glucose-Capillary: 144 mg/dL — ABNORMAL HIGH (ref 70–99)
Glucose-Capillary: 149 mg/dL — ABNORMAL HIGH (ref 70–99)
Glucose-Capillary: 152 mg/dL — ABNORMAL HIGH (ref 70–99)
Glucose-Capillary: 155 mg/dL — ABNORMAL HIGH (ref 70–99)
Glucose-Capillary: 163 mg/dL — ABNORMAL HIGH (ref 70–99)
Glucose-Capillary: 163 mg/dL — ABNORMAL HIGH (ref 70–99)
Glucose-Capillary: 169 mg/dL — ABNORMAL HIGH (ref 70–99)
Glucose-Capillary: 178 mg/dL — ABNORMAL HIGH (ref 70–99)
Glucose-Capillary: 182 mg/dL — ABNORMAL HIGH (ref 70–99)
Glucose-Capillary: 184 mg/dL — ABNORMAL HIGH (ref 70–99)
Glucose-Capillary: 194 mg/dL — ABNORMAL HIGH (ref 70–99)
Glucose-Capillary: 213 mg/dL — ABNORMAL HIGH (ref 70–99)
Glucose-Capillary: 225 mg/dL — ABNORMAL HIGH (ref 70–99)
Glucose-Capillary: 235 mg/dL — ABNORMAL HIGH (ref 70–99)

## 2010-03-16 LAB — CBC
HCT: 23 % — ABNORMAL LOW (ref 36.0–46.0)
HCT: 24.8 % — ABNORMAL LOW (ref 36.0–46.0)
HCT: 25 % — ABNORMAL LOW (ref 36.0–46.0)
HCT: 25.6 % — ABNORMAL LOW (ref 36.0–46.0)
HCT: 28 % — ABNORMAL LOW (ref 36.0–46.0)
HCT: 31.5 % — ABNORMAL LOW (ref 36.0–46.0)
HCT: 39.1 % (ref 36.0–46.0)
Hemoglobin: 13 g/dL (ref 12.0–15.0)
Hemoglobin: 7.5 g/dL — ABNORMAL LOW (ref 12.0–15.0)
Hemoglobin: 8.2 g/dL — ABNORMAL LOW (ref 12.0–15.0)
MCH: 30.1 pg (ref 26.0–34.0)
MCH: 31 pg (ref 26.0–34.0)
MCH: 31.3 pg (ref 26.0–34.0)
MCHC: 32 g/dL (ref 30.0–36.0)
MCHC: 32.8 g/dL (ref 30.0–36.0)
MCHC: 33.2 g/dL (ref 30.0–36.0)
MCHC: 33.2 g/dL (ref 30.0–36.0)
MCV: 92.9 fL (ref 78.0–100.0)
MCV: 94.3 fL (ref 78.0–100.0)
MCV: 94.5 fL (ref 78.0–100.0)
MCV: 94.9 fL (ref 78.0–100.0)
MCV: 95 fL (ref 78.0–100.0)
Platelets: 277 10*3/uL (ref 150–400)
Platelets: 286 10*3/uL (ref 150–400)
Platelets: 395 10*3/uL (ref 150–400)
Platelets: 436 10*3/uL — ABNORMAL HIGH (ref 150–400)
RBC: 2.42 MIL/uL — ABNORMAL LOW (ref 3.87–5.11)
RBC: 3.32 MIL/uL — ABNORMAL LOW (ref 3.87–5.11)
RDW: 12.7 % (ref 11.5–15.5)
RDW: 13.2 % (ref 11.5–15.5)
RDW: 13.3 % (ref 11.5–15.5)
RDW: 13.4 % (ref 11.5–15.5)
RDW: 13.5 % (ref 11.5–15.5)
WBC: 13.4 10*3/uL — ABNORMAL HIGH (ref 4.0–10.5)
WBC: 13.8 10*3/uL — ABNORMAL HIGH (ref 4.0–10.5)
WBC: 16.3 10*3/uL — ABNORMAL HIGH (ref 4.0–10.5)

## 2010-03-16 LAB — COMPREHENSIVE METABOLIC PANEL
ALT: 34 U/L (ref 0–35)
ALT: 55 U/L — ABNORMAL HIGH (ref 0–35)
AST: 127 U/L — ABNORMAL HIGH (ref 0–37)
Albumin: 2.6 g/dL — ABNORMAL LOW (ref 3.5–5.2)
Alkaline Phosphatase: 58 U/L (ref 39–117)
Alkaline Phosphatase: 73 U/L (ref 39–117)
BUN: 13 mg/dL (ref 6–23)
BUN: 14 mg/dL (ref 6–23)
CO2: 23 mEq/L (ref 19–32)
Calcium: 10.2 mg/dL (ref 8.4–10.5)
Calcium: 8.6 mg/dL (ref 8.4–10.5)
GFR calc Af Amer: 18 mL/min — ABNORMAL LOW (ref >60)
GFR calc non Af Amer: >60 mL/min (ref >60)
Glucose, Bld: 132 mg/dL — ABNORMAL HIGH (ref 70–99)
Glucose, Bld: 177 mg/dL — ABNORMAL HIGH (ref 70–99)
Glucose, Bld: 204 mg/dL — ABNORMAL HIGH (ref 70–99)
Potassium: 4.4 mEq/L (ref 3.5–5.1)
Potassium: 4.4 mEq/L (ref 3.5–5.1)
Sodium: 130 mEq/L — ABNORMAL LOW (ref 135–145)
Sodium: 133 mEq/L — ABNORMAL LOW (ref 135–145)
Sodium: 138 mEq/L (ref 135–145)
Total Protein: 5.3 g/dL — ABNORMAL LOW (ref 6.0–8.3)
Total Protein: 5.6 g/dL — ABNORMAL LOW (ref 6.0–8.3)
Total Protein: 7.4 g/dL (ref 6.0–8.3)

## 2010-03-16 LAB — BASIC METABOLIC PANEL
BUN: 13 mg/dL (ref 6–23)
BUN: 20 mg/dL (ref 6–23)
BUN: 21 mg/dL (ref 6–23)
BUN: 28 mg/dL — ABNORMAL HIGH (ref 6–23)
CO2: 17 mEq/L — ABNORMAL LOW (ref 19–32)
CO2: 19 mEq/L (ref 19–32)
CO2: 20 mEq/L (ref 19–32)
CO2: 25 mEq/L (ref 19–32)
Calcium: 8.4 mg/dL (ref 8.4–10.5)
Calcium: 8.6 mg/dL (ref 8.4–10.5)
Calcium: 8.6 mg/dL (ref 8.4–10.5)
Calcium: 8.8 mg/dL (ref 8.4–10.5)
Chloride: 101 mEq/L (ref 96–112)
Chloride: 102 mEq/L (ref 96–112)
Chloride: 109 mEq/L (ref 96–112)
Chloride: 110 mEq/L (ref 96–112)
Creatinine, Ser: 1.05 mg/dL (ref 0.4–1.2)
Creatinine, Ser: 1.48 mg/dL — ABNORMAL HIGH (ref 0.4–1.2)
Creatinine, Ser: 2.03 mg/dL — ABNORMAL HIGH (ref 0.4–1.2)
Creatinine, Ser: 2.7 mg/dL — ABNORMAL HIGH (ref 0.4–1.2)
GFR calc Af Amer: 23 mL/min — ABNORMAL LOW (ref >60)
GFR calc Af Amer: 32 mL/min — ABNORMAL LOW (ref >60)
GFR calc Af Amer: 56 mL/min — ABNORMAL LOW (ref >60)
GFR calc non Af Amer: 25 mL/min — ABNORMAL LOW (ref >60)
GFR calc non Af Amer: 38 mL/min — ABNORMAL LOW (ref >60)
Glucose, Bld: 126 mg/dL — ABNORMAL HIGH (ref 70–99)
Glucose, Bld: 142 mg/dL — ABNORMAL HIGH (ref 70–99)
Glucose, Bld: 146 mg/dL — ABNORMAL HIGH (ref 70–99)
Glucose, Bld: 178 mg/dL — ABNORMAL HIGH (ref 70–99)
Potassium: 3.4 mEq/L — ABNORMAL LOW (ref 3.5–5.1)
Potassium: 3.7 mEq/L (ref 3.5–5.1)
Potassium: 4 mEq/L (ref 3.5–5.1)
Potassium: 4.1 mEq/L (ref 3.5–5.1)
Sodium: 129 mEq/L — ABNORMAL LOW (ref 135–145)
Sodium: 136 mEq/L (ref 135–145)
Sodium: 141 mEq/L (ref 135–145)

## 2010-03-16 LAB — DIFFERENTIAL
Basophils Relative: 0 % (ref 0–1)
Basophils Relative: 1 % (ref 0–1)
Eosinophils Absolute: 0.3 10*3/uL (ref 0.0–0.7)
Eosinophils Relative: 4 % (ref 0–5)
Lymphs Abs: 1.5 10*3/uL (ref 0.7–4.0)
Monocytes Absolute: 0.8 10*3/uL (ref 0.1–1.0)
Neutro Abs: 7.1 10*3/uL (ref 1.7–7.7)
Neutrophils Relative %: 69 % (ref 43–77)
Neutrophils Relative %: 71 % (ref 43–77)

## 2010-03-16 LAB — URINALYSIS, ROUTINE W REFLEX MICROSCOPIC
Bilirubin Urine: NEGATIVE
Glucose, UA: NEGATIVE mg/dL
Nitrite: NEGATIVE
Nitrite: NEGATIVE
Nitrite: NEGATIVE
Protein, ur: 100 mg/dL — AB
Specific Gravity, Urine: 1.011 (ref 1.005–1.030)
Specific Gravity, Urine: 1.011 (ref 1.005–1.030)
Specific Gravity, Urine: 1.012 (ref 1.005–1.030)
Urobilinogen, UA: 0.2 mg/dL (ref 0.0–1.0)
Urobilinogen, UA: 0.2 mg/dL (ref 0.0–1.0)
pH: 6 (ref 5.0–8.0)
pH: 6 (ref 5.0–8.0)

## 2010-03-16 LAB — URINE CULTURE: Culture  Setup Time: 201111191711

## 2010-03-16 LAB — PROTIME-INR
INR: 0.94 (ref 0.00–1.49)
Prothrombin Time: 12.8 seconds (ref 11.6–15.2)

## 2010-03-16 LAB — POCT I-STAT GLUCOSE: Glucose, Bld: 159 mg/dL — ABNORMAL HIGH (ref 70–99)

## 2010-03-16 LAB — URINE MICROSCOPIC-ADD ON

## 2010-03-16 LAB — APTT: aPTT: 25 seconds (ref 24–37)

## 2010-03-16 LAB — RPR: RPR Ser Ql: NONREACTIVE

## 2010-03-16 LAB — TSH: TSH: 2.702 u[IU]/mL (ref 0.350–4.500)

## 2010-03-16 MED ORDER — GABAPENTIN 600 MG PO TABS: 600.0000 mg | ORAL_TABLET | Freq: Three times a day (TID) | ORAL | Status: DC

## 2010-03-16 NOTE — Assessment & Plan Note (Signed)
NORMAL ABI evaluation.  Leg pain unlikely due to abnormal blood flow.     Patient willing to increase dose of gabapentin.   Instructed on titration to next dose.  Cautioned about excessive sedation.   Titrate to higher dose of gabapentin over then next four days.   Gabapentin 600mg  TID new maintenance dose 5 days from today.

## 2010-03-16 NOTE — Progress Notes (Signed)
  Subjective:    Patient ID: Christine Bradshaw, female    DOB: 28-Jul-1965, 45 y.o.   MRN: [id number]  HPI Reviewed and agree. Hensel   Review of Systems     Objective:   Physical Exam        Assessment & Plan:

## 2010-03-16 NOTE — Progress Notes (Signed)
  Subjective:    Patient ID: Christine Bradshaw, female    DOB: [birth year], 45 y.o.   MRN: [id number]  HPI Neuropathy improved slightly with initiation of gabapentin.   Denies sedation with use of gabapentin.    Review of Systems     Objective:   Physical Exam    Right Ankle ABI:   0.95 Left Ankle ABI:  1.07       Assessment & Plan:

## 2010-03-16 NOTE — Patient Instructions (Addendum)
1)  Blood flow test showed good blood flow in your legs - similar to your arms.  This is good news.  2)  You are currently taking neurontin (gabapentin) 300mg  three times daily.  3)  Gabapentin - Today and tomorrow take 300mg  in Am and 300mg  at lunch AND 600mg  at bedtime.  4)  Gabapentin - Thursday and Friday - take 300mg  in AM and 600mg  at [facility] AND 600mg  at bedtime.  5)  Gabapentin - Saturday and beyond take 600mg  in AM at [facility] AND at bedtime. THREE times daily.  6)  Call our office if you get too tired/sleepy during the day.  7)  New

## 2010-03-22 ENCOUNTER — Telehealth: Payer: Self-pay | Admitting: Family Medicine

## 2010-03-22 NOTE — Telephone Encounter (Signed)
Need refills on Lasix 40 mg and Metformin.  Pt says the Lasix is not working even though she is taking it twice a day.

## 2010-03-23 ENCOUNTER — Other Ambulatory Visit: Payer: Self-pay | Admitting: Family Medicine

## 2010-03-23 MED ORDER — FUROSEMIDE 40 MG PO TABS: 40.0000 mg | ORAL_TABLET | Freq: Every day | ORAL | Status: DC

## 2010-03-23 MED ORDER — METFORMIN HCL 1000 MG PO TABS: 1000.0000 mg | ORAL_TABLET | Freq: Two times a day (BID) | ORAL | Status: DC

## 2010-04-05 ENCOUNTER — Encounter: Payer: Self-pay | Admitting: Family Medicine

## 2010-04-05 ENCOUNTER — Ambulatory Visit (INDEPENDENT_AMBULATORY_CARE_PROVIDER_SITE_OTHER): Payer: Self-pay | Admitting: Family Medicine

## 2010-04-05 VITALS — BP 118/88 | HR 84 | Temp 98.5°F | Wt 297.0 lb

## 2010-04-05 DIAGNOSIS — K589 Irritable bowel syndrome without diarrhea: Secondary | ICD-10-CM

## 2010-04-05 DIAGNOSIS — R300 Dysuria: Secondary | ICD-10-CM | POA: Insufficient documentation

## 2010-04-05 LAB — POCT URINALYSIS DIPSTICK
Bilirubin, UA: NEGATIVE
Blood, UA: NEGATIVE
Leukocytes, UA: NEGATIVE
Nitrite, UA: NEGATIVE
Protein, UA: NEGATIVE
pH, UA: 5.5

## 2010-04-05 MED ORDER — CHOLESTYRAMINE 4 G PO PACK: 1.0000 | PACK | Freq: Two times a day (BID) | ORAL | Status: DC

## 2010-04-05 NOTE — Assessment & Plan Note (Signed)
Awaiting UA if abnormal will treat with abx likely more due to dehydration with pt using lasix and having diarrhea, no red flags no back pain no hematuria.  Gave pt red flags to look out and when to seek medical attention.

## 2010-04-05 NOTE — Patient Instructions (Signed)
Very nice to meet you I want you to increase your cholestyramine to twice a day until bowels become more regular. Once regular try going back to once daily.  If you get a fever, the diarrhea gets worse or you get significant abdominal pain then seek medical attention.  I want you to come back in 2 weeks to see Dr. Hulen Luster or myself in 2 weeks if still not at your normal. I will call you with the results of the urine if we need to change something.

## 2010-04-05 NOTE — Assessment & Plan Note (Addendum)
Will increase cholestyramine to twice daily in the short term, told pt once regular to get better then back to once a day.  Gave red flags for what to look out for and when to seek medical attention.

## 2010-04-05 NOTE — Progress Notes (Signed)
  Subjective:    Patient ID: DELOMA SPINDLE, female    DOB: 16-Oct-1965, 45 y.o.   MRN: [id number]  HPI 45 yo female here with hx of IBS coming in with diarrhea   DIARRHEA  Onset: 3 weeks  Time course: after a viral infection  Description: watery   Number of stools per day: 3  Previous treatment: yes, pt has had IBS on cholestyramine  Vomiting: yes, but none for last 2 weeks  Abdominal Pain: no  Weight loss: no  Decreased urine output: yes  Lightheadedness: no  Recent travel history: no    Sick contacts: no    Suspicious food or water: no  Change in diet: no  Color black but taking pepto bismol.   Red Flags Fever: yes, first week otherwise none  Bloody stools: no  Recent antibiotics: no  Immuncompromised: no    ROS is positive for dysuria for the last 2 days as well but denies back pain out of the ordinary, shortness of breath dyspnea on exertion Review of Systems     Objective:   Physical Exam Gen: NAD CV: RRR no murmur appreciated Pul: CTAB Abd: obese NT ND BS+x4 neg murphy, negative Ext: trace edma LE bilaterally       Assessment & Plan:

## 2010-04-14 LAB — GLUCOSE, CAPILLARY

## 2010-05-10 ENCOUNTER — Other Ambulatory Visit: Payer: Self-pay | Admitting: Family Medicine

## 2010-05-10 ENCOUNTER — Other Ambulatory Visit: Payer: Self-pay | Admitting: Internal Medicine

## 2010-05-10 DIAGNOSIS — Z1231 Encounter for screening mammogram for malignant neoplasm of breast: Secondary | ICD-10-CM

## 2010-05-14 ENCOUNTER — Ambulatory Visit (HOSPITAL_COMMUNITY)
Admission: RE | Admit: 2010-05-14 | Discharge: 2010-05-14 | Disposition: A | Discharge status: Home / Self Care | Payer: Self-pay | Source: Ambulatory Visit | Attending: Internal Medicine | Admitting: Internal Medicine

## 2010-05-14 DIAGNOSIS — Z1231 Encounter for screening mammogram for malignant neoplasm of breast: Secondary | ICD-10-CM | POA: Insufficient documentation

## 2010-06-18 ENCOUNTER — Other Ambulatory Visit: Payer: Self-pay | Admitting: Family Medicine

## 2010-08-13 ENCOUNTER — Encounter: Payer: Self-pay | Admitting: Family Medicine

## 2010-08-13 ENCOUNTER — Ambulatory Visit (INDEPENDENT_AMBULATORY_CARE_PROVIDER_SITE_OTHER): Payer: Self-pay | Admitting: Family Medicine

## 2010-08-13 VITALS — BP 155/102 | HR 97 | Temp 98.0°F | Wt 311.8 lb

## 2010-08-13 DIAGNOSIS — E119 Type 2 diabetes mellitus without complications: Secondary | ICD-10-CM

## 2010-08-13 DIAGNOSIS — M793 Panniculitis, unspecified: Secondary | ICD-10-CM

## 2010-08-13 DIAGNOSIS — I10 Essential (primary) hypertension: Secondary | ICD-10-CM

## 2010-08-13 DIAGNOSIS — E785 Hyperlipidemia, unspecified: Secondary | ICD-10-CM

## 2010-08-13 MED ORDER — INSULIN GLARGINE 100 UNIT/ML ~~LOC~~ SOLN: 5.0000 [IU] | Freq: Every day | SUBCUTANEOUS | Status: DC

## 2010-08-13 MED ORDER — NYSTATIN 100000 UNIT/GM EX POWD: CUTANEOUS | Status: AC

## 2010-08-13 MED ORDER — LISINOPRIL 10 MG PO TABS: 10.0000 mg | ORAL_TABLET | Freq: Every day | ORAL | Status: DC

## 2010-08-13 NOTE — Progress Notes (Signed)
  Subjective:    Patient ID: Christine Bradshaw, female    DOB: October 29, 1965, 45 y.o.   MRN: [id number]  HPI DM-  CBGs have been high since back surgery.  She is not on [place] now because cant afford it but plans to get back on it. No low CBGS.    HTN- Bps elevated in other doctor's office.  No SOB or CP.  Taking HCTZ every day.    HL-  Increased risperdal.  Weight is up some.  Will check lipids.  Skin rash-  Having redness, itching and irritation under pannus.  Has tried desitin and OTC antifungal.  Is better but not gone.    Review of Systems Denies CP, SOB, HA, N/V/D, fever     Objective:   Physical Exam  Vital signs reviewed General appearance - alert, well appearing, and in no distress and oriented to person, place, and time Heart - normal rate, regular rhythm, normal S1, S2, no murmurs, rubs, clicks or gallops Chest - clear to auscultation, no wheezes, rales or rhonchi, symmetric air entry, no tachypnea, retractions or cyanosis Abdomen - soft, nontender, nondistended, no masses or organomegaly Skin-  Skin under pannus red with some satellite lesions no pus or open lesions       Assessment & Plan:  Panniculitis Mild.  Fungal plus moisture and heat.  Advised careful drying and will try nystatin powdeer  HYPERTENSION BPs have been elevated.  Will start lisinopril today at 10mg . Check CMET  HYPERLIPIDEMIA Will check lipid panel today  DIABETES MELLITUS Will start lantus at 5 units.  Pt instructed on titration to a max of 12 units of lantus with parameters.  See pt instructions.  a1c was increased today

## 2010-08-13 NOTE — Assessment & Plan Note (Signed)
Will check lipid panel today 

## 2010-08-13 NOTE — Assessment & Plan Note (Signed)
Will start lantus at 5 units.  Pt instructed on titration to a max of 12 units of lantus with parameters.  See pt instructions.  a1c was increased today

## 2010-08-13 NOTE — Assessment & Plan Note (Signed)
Mild.  Fungal plus moisture and heat.  Advised careful drying and will try nystatin powdeer

## 2010-08-13 NOTE — Patient Instructions (Signed)
Start lantus:  Start at [place] in the morning tomorrow On Sunday if your blood sugar in the morning is above 130, increase your lantus by 1 unit You can do this each day until your blood sugar in the morning is below 130  I will see you in one week for recheck

## 2010-08-13 NOTE — Assessment & Plan Note (Signed)
BPs have been elevated.  Will start lisinopril today at 10mg . Check CMET

## 2010-08-14 LAB — COMPREHENSIVE METABOLIC PANEL
AST: 24 U/L (ref 0–37)
Albumin: 4.5 g/dL (ref 3.5–5.2)
Alkaline Phosphatase: 123 U/L — ABNORMAL HIGH (ref 39–117)
BUN: 13 mg/dL (ref 6–23)
Calcium: 10 mg/dL (ref 8.4–10.5)
Chloride: 95 mEq/L — ABNORMAL LOW (ref 96–112)
Glucose, Bld: 134 mg/dL — ABNORMAL HIGH (ref 70–99)
Potassium: 4.6 mEq/L (ref 3.5–5.3)
Sodium: 135 mEq/L (ref 135–145)
Total Protein: 7.3 g/dL (ref 6.0–8.3)

## 2010-08-14 LAB — LIPID PANEL

## 2010-08-17 ENCOUNTER — Other Ambulatory Visit: Payer: Self-pay | Admitting: Orthopedic Surgery

## 2010-08-17 DIAGNOSIS — M79672 Pain in left foot: Secondary | ICD-10-CM

## 2010-08-17 DIAGNOSIS — M79605 Pain in left leg: Secondary | ICD-10-CM

## 2010-08-17 DIAGNOSIS — M545 Low back pain, unspecified: Secondary | ICD-10-CM

## 2010-08-19 MED ORDER — DIAZEPAM 2 MG PO TABS: 10.0000 mg | ORAL_TABLET | Freq: Once | ORAL | Status: DC

## 2010-08-19 MED ORDER — DIAZEPAM 2 MG PO TABS
10.0000 mg | ORAL_TABLET | Freq: Once | ORAL | Status: AC
  Administered 2010-08-20: 10 mg via ORAL

## 2010-08-20 ENCOUNTER — Ambulatory Visit
Admission: RE | Admit: 2010-08-20 | Discharge: 2010-08-20 | Disposition: A | Discharge status: Home / Self Care | Payer: PRIVATE HEALTH INSURANCE | Source: Ambulatory Visit | Attending: Orthopedic Surgery | Admitting: Orthopedic Surgery

## 2010-08-20 ENCOUNTER — Ambulatory Visit: Payer: Self-pay

## 2010-08-20 DIAGNOSIS — M549 Dorsalgia, unspecified: Secondary | ICD-10-CM

## 2010-08-20 DIAGNOSIS — M545 Low back pain, unspecified: Secondary | ICD-10-CM

## 2010-08-20 DIAGNOSIS — M79605 Pain in left leg: Secondary | ICD-10-CM

## 2010-08-20 DIAGNOSIS — M79672 Pain in left foot: Secondary | ICD-10-CM

## 2010-08-20 MED ORDER — IOHEXOL 300 MG/ML  SOLN
15.0000 mL | Freq: Once | INTRAMUSCULAR | Status: AC | PRN
  Administered 2010-08-20: 15 mL via INTRATHECAL

## 2010-08-20 MED ORDER — ONDANSETRON HCL 4 MG/2ML IJ SOLN
4.0000 mg | Freq: Once | INTRAMUSCULAR | Status: AC
  Administered 2010-08-20: 4 mg via INTRAMUSCULAR

## 2010-08-20 MED ORDER — HYDROMORPHONE HCL 2 MG/ML IJ SOLN
1.0000 mg | Freq: Once | INTRAMUSCULAR | Status: AC
  Administered 2010-08-20: 1.5 mg via INTRAMUSCULAR

## 2010-08-20 NOTE — Progress Notes (Signed)
Pt resting comfortably with husband at bedside.  Pt quite relieved procedure is over and states we were very reassuring to her throughout the myelogram.  Rx for Valium for pt for next 24 hours secondary to stopping meds cold [place].  jkl

## 2010-08-20 NOTE — Progress Notes (Signed)
Pt states she has been off her Resperdone, Trazadone and Cymbalta for the past two days, and "it's been rough."  jkl

## 2010-08-23 ENCOUNTER — Encounter: Payer: Self-pay | Admitting: Family Medicine

## 2010-08-23 ENCOUNTER — Ambulatory Visit (INDEPENDENT_AMBULATORY_CARE_PROVIDER_SITE_OTHER): Payer: Self-pay | Admitting: Family Medicine

## 2010-08-23 VITALS — BP 149/87 | HR 103 | Temp 98.0°F | Ht 64.0 in | Wt 304.0 lb

## 2010-08-23 DIAGNOSIS — E119 Type 2 diabetes mellitus without complications: Secondary | ICD-10-CM

## 2010-08-23 DIAGNOSIS — I10 Essential (primary) hypertension: Secondary | ICD-10-CM

## 2010-08-23 DIAGNOSIS — K589 Irritable bowel syndrome without diarrhea: Secondary | ICD-10-CM

## 2010-08-23 DIAGNOSIS — R609 Edema, unspecified: Secondary | ICD-10-CM

## 2010-08-23 DIAGNOSIS — R600 Localized edema: Secondary | ICD-10-CM

## 2010-08-23 DIAGNOSIS — E785 Hyperlipidemia, unspecified: Secondary | ICD-10-CM

## 2010-08-23 LAB — BASIC METABOLIC PANEL
CO2: 22 mEq/L (ref 19–32)
Chloride: 99 mEq/L (ref 96–112)
Sodium: 133 mEq/L — ABNORMAL LOW (ref 135–145)

## 2010-08-23 MED ORDER — PRAVASTATIN SODIUM 40 MG PO TABS: 40.0000 mg | ORAL_TABLET | Freq: Every evening | ORAL | Status: DC

## 2010-08-23 MED ORDER — INSULIN ASPART 100 UNIT/ML ~~LOC~~ SOLN: SUBCUTANEOUS | Status: DC

## 2010-08-23 MED ORDER — FENOFIBRATE 145 MG PO TABS: 145.0000 mg | ORAL_TABLET | Freq: Every day | ORAL | Status: DC

## 2010-08-23 MED ORDER — "INSULIN SYRINGE 29G X 1/2"" 0.5 ML MISC": Status: DC

## 2010-08-23 MED ORDER — LISINOPRIL 20 MG PO TABS: 20.0000 mg | ORAL_TABLET | Freq: Every day | ORAL | Status: DC

## 2010-08-23 NOTE — Patient Instructions (Signed)
Please start pravastatin and tricor again Stop Christine Bradshaw- this is bad for your triglycerides Instead use fiber supplements (sugar free)  Bring your bottle of mega red at your next visit  Keep going on the lantus, you are doing great  Do not restart the HCTZ, we will just go up on the lisinopril Take 2 tabs of lisinopril each day until you refill to the higher dose Stop the potassium for now

## 2010-08-23 NOTE — Progress Notes (Signed)
  Subjective:    Patient ID: GETHSEMANE FISCHLER, female    DOB: 1965-02-12, 45 y.o.   MRN: [id number]  HPI  HYPERTENSION Disease Monitoring Blood pressure range-not checking at home Chest pain- none      Dyspnea- none Medications Compliance- good Lightheadedness- none   Edema- take lasix when bad   DIABETES Disease Monitoring Blood Sugar ranges-160-190 in AM Polyuria- none New Visual problems- none Medications Compliance- good, not on januvia due to cost, trying to get from [facility] Hypoglycemic symptoms- none   HYPERLIPIDEMIA Disease Monitoring See symptoms for Hypertension Medications Compliance- not taking tricor or statin, is taking fish oil RUQ pain- none  Muscle aches- none  ROS See HPI above   PMH Smoking Status noted    Review of Systems     Objective:   Physical Exam Vital signs reviewed General appearance - alert, well appearing, and in no distress and oriented to person, place, and time Heart - normal rate, regular rhythm, normal S1, S2, no murmurs, rubs, clicks or gallops Chest - clear to auscultation, no wheezes, rales or rhonchi, symmetric air entry, no tachypnea, retractions or cyanosis Extremities - peripheral pulses normal, no pedal edema, no clubbing or cyanosis        Assessment & Plan:  DIABETES MELLITUS At 12 units lantus.  CBGs in AM still 165-190.  Pt should continue to titrate lantus.  See back in 1-2 weeks  HYPERLIPIDEMIA Not taking simvastatin.  Will change to pravastatin today.  Recheck lipids in 3 months  HYPERTENSION Close to goal.  Increase lisinopril to 20, recheck BMET today.    IRRITABLE BOWEL SYNDROME Asked her to slowly titrate down the questran while going up on fiber because of questran's effect on triglycerides.  She will try this  Edema of both legs Lasix 40 po q day.  Stopped KCl since increasing lisinopril.  Consider going to compression hose instead

## 2010-08-23 NOTE — Assessment & Plan Note (Signed)
Not taking simvastatin.  Will change to pravastatin today.  Recheck lipids in 3 months

## 2010-08-23 NOTE — Assessment & Plan Note (Signed)
Asked her to slowly titrate down the questran while going up on fiber because of questran's effect on triglycerides.  She will try this

## 2010-08-23 NOTE — Assessment & Plan Note (Signed)
At 12 units lantus.  CBGs in AM still 165-190.  Pt should continue to titrate lantus.  See back in 1-2 weeks

## 2010-08-23 NOTE — Assessment & Plan Note (Signed)
Lasix 40 po q day.  Stopped KCl since increasing lisinopril.  Consider going to compression hose instead

## 2010-08-23 NOTE — Assessment & Plan Note (Signed)
Close to goal.  Increase lisinopril to 20, recheck BMET today.

## 2010-08-24 ENCOUNTER — Telehealth: Payer: Self-pay | Admitting: Family Medicine

## 2010-08-24 NOTE — Telephone Encounter (Signed)
Needs clarification as to how she needs to take the Lantus.

## 2010-08-31 NOTE — Telephone Encounter (Signed)
Has this been addressed?

## 2010-08-31 NOTE — Telephone Encounter (Signed)
Reviewed how to titrate lantus

## 2010-09-03 ENCOUNTER — Other Ambulatory Visit: Payer: Self-pay | Admitting: Family Medicine

## 2010-09-03 MED ORDER — FENOFIBRATE 145 MG PO TABS: 145.0000 mg | ORAL_TABLET | Freq: Every day | ORAL | Status: DC

## 2010-09-13 ENCOUNTER — Other Ambulatory Visit: Payer: Self-pay | Admitting: Family Medicine

## 2010-09-13 DIAGNOSIS — E1140 Type 2 diabetes mellitus with diabetic neuropathy, unspecified: Secondary | ICD-10-CM

## 2010-09-13 MED ORDER — GABAPENTIN 600 MG PO TABS: 600.0000 mg | ORAL_TABLET | Freq: Three times a day (TID) | ORAL | Status: DC

## 2010-09-13 MED ORDER — PANTOPRAZOLE SODIUM 20 MG PO TBEC: 20.0000 mg | DELAYED_RELEASE_TABLET | Freq: Every day | ORAL | Status: DC

## 2010-09-13 MED ORDER — SITAGLIPTIN PHOSPHATE 100 MG PO TABS: 100.0000 mg | ORAL_TABLET | Freq: Every day | ORAL | Status: DC

## 2010-09-13 MED ORDER — DESLORATADINE 5 MG PO TABS: 5.0000 mg | ORAL_TABLET | Freq: Every day | ORAL | Status: DC

## 2010-09-13 MED ORDER — INSULIN ASPART 100 UNIT/ML ~~LOC~~ SOLN: SUBCUTANEOUS | Status: DC

## 2010-09-29 ENCOUNTER — Encounter: Payer: Self-pay | Admitting: Family Medicine

## 2010-09-29 ENCOUNTER — Ambulatory Visit (INDEPENDENT_AMBULATORY_CARE_PROVIDER_SITE_OTHER): Payer: Self-pay | Admitting: Family Medicine

## 2010-09-29 DIAGNOSIS — E1142 Type 2 diabetes mellitus with diabetic polyneuropathy: Secondary | ICD-10-CM

## 2010-09-29 DIAGNOSIS — E785 Hyperlipidemia, unspecified: Secondary | ICD-10-CM

## 2010-09-29 DIAGNOSIS — E1149 Type 2 diabetes mellitus with other diabetic neurological complication: Secondary | ICD-10-CM

## 2010-09-29 DIAGNOSIS — E119 Type 2 diabetes mellitus without complications: Secondary | ICD-10-CM

## 2010-09-29 DIAGNOSIS — I10 Essential (primary) hypertension: Secondary | ICD-10-CM

## 2010-09-29 DIAGNOSIS — E1140 Type 2 diabetes mellitus with diabetic neuropathy, unspecified: Secondary | ICD-10-CM

## 2010-09-29 MED ORDER — BUTENAFINE HCL 1 % EX CREA: 1.0000 "application " | TOPICAL_CREAM | Freq: Two times a day (BID) | CUTANEOUS | Status: AC

## 2010-09-29 MED ORDER — OMEGA-3-ACID ETHYL ESTERS 1 G PO CAPS: 2.0000 g | ORAL_CAPSULE | Freq: Two times a day (BID) | ORAL | Status: DC

## 2010-09-29 MED ORDER — "INSULIN SYRINGE 28G X 1/2"" 1 ML MISC": 1.0000 | Freq: Four times a day (QID) | Status: DC

## 2010-09-29 MED ORDER — PRAVASTATIN SODIUM 80 MG PO TABS: 80.0000 mg | ORAL_TABLET | Freq: Every evening | ORAL | Status: DC

## 2010-09-29 MED ORDER — GABAPENTIN 600 MG PO TABS: 1200.0000 mg | ORAL_TABLET | Freq: Three times a day (TID) | ORAL | Status: DC

## 2010-09-29 NOTE — Assessment & Plan Note (Signed)
BP well controlled today. Continue current regimen.    

## 2010-09-29 NOTE — Patient Instructions (Signed)
Please continue to titrate your lantus  I have sent in bigger syringes, see if this helps  I have sent in lotrimin for your foot.  Apply twice a day.  If not much improved in 2 weeks, i want to see it again  i have sent in lovaza (fish oil) if [facility] can get it.  If not, continue what you are taking for now  I have sent in a new script for pravachol at a higher dose.

## 2010-09-29 NOTE — Progress Notes (Signed)
  Subjective:    Patient ID: Christine Bradshaw, female    DOB: 1965-12-29, 45 y.o.   MRN: [id number]  HPI  Peripheral neuropathy- feeling much better on gabapentin.  Right foot still has tingling and she would like to increase gabapentin  DM- continues to titrate lantus now at 45.  Am CBGs from 120-170.  Taking 2 units novolog at mealtimes.  Restarted januvia  Spot on foot- flaky red lesion on arch of foot x 1 month.  Pt treating with vaseline and a bandage, not getting better, has grown some  Review of Systems    no HA, N/V/D Objective:   Physical Exam  Vital signs reviewed General appearance - alert, well appearing, and in no distress and oriented to person, place, and time Heart - normal rate, regular rhythm, normal S1, S2, no murmurs, rubs, clicks or gallops Chest - clear to auscultation, no wheezes, rales or rhonchi, symmetric air entry, no tachypnea, retractions or cyanosis Right foot- arch with reddened quartersized lesion with purplish interior and red outline.        Assessment & Plan:

## 2010-09-29 NOTE — Assessment & Plan Note (Signed)
Doing well with self titration, will rx bigger syringes.

## 2010-09-29 NOTE — Assessment & Plan Note (Signed)
Will increase pravachol to 80mg  to maximize treatment and give rx for lovaza if [facility] can get it for her.

## 2010-10-06 ENCOUNTER — Other Ambulatory Visit: Payer: Self-pay | Admitting: Family Medicine

## 2010-10-07 NOTE — Telephone Encounter (Signed)
Refill request

## 2010-10-29 ENCOUNTER — Other Ambulatory Visit: Payer: Self-pay | Admitting: Family Medicine

## 2010-10-29 DIAGNOSIS — E119 Type 2 diabetes mellitus without complications: Secondary | ICD-10-CM

## 2010-10-29 MED ORDER — INSULIN GLARGINE 100 UNIT/ML ~~LOC~~ SOLN: 45.0000 [IU] | Freq: Every day | SUBCUTANEOUS | Status: DC

## 2010-11-08 ENCOUNTER — Encounter: Payer: Self-pay | Admitting: Family Medicine

## 2010-11-08 ENCOUNTER — Telehealth: Payer: Self-pay | Admitting: Family Medicine

## 2010-11-08 NOTE — Telephone Encounter (Signed)
Please fax new rx for lantus to show rx to be orderd for 60 to 70 units to [facility] pharmacy.  Was told to  Increase units until levels were stabilized.

## 2010-11-08 NOTE — Telephone Encounter (Signed)
Will forward to Dr.Spiegel 

## 2010-11-11 ENCOUNTER — Telehealth: Payer: Self-pay | Admitting: Family Medicine

## 2010-11-11 DIAGNOSIS — E119 Type 2 diabetes mellitus without complications: Secondary | ICD-10-CM

## 2010-11-11 MED ORDER — INSULIN GLARGINE 100 UNIT/ML ~~LOC~~ SOLN: 70.0000 [IU] | Freq: Every day | SUBCUTANEOUS | Status: DC

## 2010-11-11 NOTE — Telephone Encounter (Signed)
filled

## 2010-11-11 NOTE — Telephone Encounter (Signed)
Pt checking status of rx for lantus, is almost out and says health dept has been faxing [place] since last week, pt needs this sent to [facility] asap

## 2010-11-12 ENCOUNTER — Telehealth: Payer: Self-pay | Admitting: *Deleted

## 2010-11-12 NOTE — Telephone Encounter (Signed)
[facility] program called to see what meds (tricor or [place]) the patient was to be taking, or if it is indeed both.  I cant tell from the med list nor the office visit which she is supposed to be taking.  On one of the office visits she discontinued one of the meds.  Will forward to MD covering Dr. Hulen Luster (Dr. Gwendolyn Grant) Yaiden Yang, Maryjo [place]

## 2010-11-13 NOTE — Telephone Encounter (Signed)
It looks like she wrote for Lovaza and was unsure if [facility] had this on formulary.  I would continue with Lovaza unless [facility] does not have this, and if that is the case, continue the Tricor.  I would also re-check with Dr. Hulen Luster when she gets back .

## 2010-11-15 NOTE — Telephone Encounter (Signed)
Will call back on Tuesday, they are closed today for the holiday. Bodin Gorka, Maryjo Rochester

## 2010-11-16 ENCOUNTER — Ambulatory Visit: Payer: Self-pay

## 2010-11-16 NOTE — Telephone Encounter (Signed)
LMOVM of [facility] of the message form Dr. Gwendolyn Grant.  Will forward to Dr. Hulen Luster, for when she gets back. ........................................... Fleeger, Maryjo [place]

## 2010-11-17 ENCOUNTER — Ambulatory Visit (INDEPENDENT_AMBULATORY_CARE_PROVIDER_SITE_OTHER): Payer: Self-pay | Admitting: Family Medicine

## 2010-11-17 ENCOUNTER — Telehealth: Payer: Self-pay | Admitting: Family Medicine

## 2010-11-17 ENCOUNTER — Encounter: Payer: Self-pay | Admitting: Family Medicine

## 2010-11-17 VITALS — BP 130/85 | HR 89 | Temp 97.9°F | Ht 64.0 in | Wt 301.0 lb

## 2010-11-17 DIAGNOSIS — Z23 Encounter for immunization: Secondary | ICD-10-CM

## 2010-11-17 DIAGNOSIS — L309 Dermatitis, unspecified: Secondary | ICD-10-CM

## 2010-11-17 DIAGNOSIS — L259 Unspecified contact dermatitis, unspecified cause: Secondary | ICD-10-CM

## 2010-11-17 DIAGNOSIS — J329 Chronic sinusitis, unspecified: Secondary | ICD-10-CM | POA: Insufficient documentation

## 2010-11-17 MED ORDER — TRIAMCINOLONE ACETONIDE 0.1 % EX CREA: TOPICAL_CREAM | Freq: Two times a day (BID) | CUTANEOUS | Status: AC

## 2010-11-17 NOTE — Progress Notes (Signed)
  Subjective:    Patient ID: Christine Bradshaw, female    DOB: 1965/12/26, 45 y.o.   MRN: [id number]  HPI 45 year old female with multiple medical comorbidities here for same-day appointment for sinusitis now resolved and rash  Sinusitis: Patient notes 2-3 weeks of left-sided facial pain, postnasal drainage, left nasal congestion. She states today all of these symptoms have resolved. She has had no fever, chills, cough, dyspnea. She did not use anything for self-medication.  Rash: Patient notes she has had several weeks of what she calls hives. They started spontaneously and were not associated with any change in soaps, detergents, medications. She notes it she lesions on her upper buttocks, abdomen, breasts. She has been treating with over-the-counter athlete's foot cream. She notes they are nearly resolved.Extensive itching.    Review of Systems please see history of of present illness     Objective:   Physical Exam GEN: Alert & Oriented, No acute distress Skin: To approximately 2-3 cm circular lesion without any raised borders over her lower back. Appear very excoriated and scaling without significant erythema. Also has a lesion on her left breast that is larger but similar in appearance as well as on her upper abdomen.        Assessment & Plan:

## 2010-11-17 NOTE — Telephone Encounter (Signed)
Please call Crystal back regarding patient.  There is another question she need answered.  Please select opt #2 after calling main number

## 2010-11-17 NOTE — Telephone Encounter (Signed)
Spoke with Crystal at the [facility] program.  Just wanting to verify again that we do want the patient on Lovaza because it was not on the patient's medication list. They do have the prescription written by Dr. Hulen Luster for Lovaza 1 gm, take two capsules twice a day.   Advised per Dr. Gwendolyn Grant to have patient take the Lovaza and not the Tricor if it is available thru the [facility] program.  Also wanting to verify patients Neurontin dose because on the patient's med list this medication is on there twice.  One for 1200 mg to take three times a day and 600 mg to take three times a day. Advised Crystal per patients office visit on 09/29/10 the Neurontin was increased to 1200mg  to take three times a day.  Will forward to Dr. Hulen Luster for review.  Ileana Ladd

## 2010-11-17 NOTE — Patient Instructions (Signed)
I would avoid decongestants because they can raise your blood pressure  Use nasale saline sprays and rinses to help with post nasal drainage  Return if rash is not improved with triamcinolone cream

## 2010-11-17 NOTE — Assessment & Plan Note (Signed)
All symptoms are resolved today. No need for further treatment. We spent some time discussing the natural course of sinusitis and the limited role of antibiotics. I advised if her symptoms recur to consider nasal saline spray and nasal saline irrigation to help with her symptoms of postnasal drainage. She is already on an antihistamine

## 2010-11-17 NOTE — Assessment & Plan Note (Signed)
Appears more like a irritant dermatitis than a tinea infection at this time and she has already been self treating adequately. This may be residual itching from a resolved tinea infection. Will treat with topical steroids for itching. She has an appointment in 2 weeks and I advised her to bring this up if rash does not fully resolve. May consider scraping at that time for diagnosis.

## 2010-11-18 NOTE — Telephone Encounter (Signed)
This looks good.  Thanks for taking care of this.

## 2010-11-30 ENCOUNTER — Ambulatory Visit (INDEPENDENT_AMBULATORY_CARE_PROVIDER_SITE_OTHER): Payer: Self-pay | Admitting: Family Medicine

## 2010-11-30 ENCOUNTER — Encounter: Payer: Self-pay | Admitting: Family Medicine

## 2010-11-30 VITALS — BP 134/82 | HR 98 | Temp 98.0°F | Ht 64.0 in | Wt 299.0 lb

## 2010-11-30 DIAGNOSIS — E119 Type 2 diabetes mellitus without complications: Secondary | ICD-10-CM

## 2010-11-30 DIAGNOSIS — E1140 Type 2 diabetes mellitus with diabetic neuropathy, unspecified: Secondary | ICD-10-CM

## 2010-11-30 DIAGNOSIS — E785 Hyperlipidemia, unspecified: Secondary | ICD-10-CM

## 2010-11-30 DIAGNOSIS — E1142 Type 2 diabetes mellitus with diabetic polyneuropathy: Secondary | ICD-10-CM

## 2010-11-30 DIAGNOSIS — I10 Essential (primary) hypertension: Secondary | ICD-10-CM

## 2010-11-30 DIAGNOSIS — E1149 Type 2 diabetes mellitus with other diabetic neurological complication: Secondary | ICD-10-CM

## 2010-11-30 MED ORDER — GABAPENTIN 600 MG PO TABS: 1200.0000 mg | ORAL_TABLET | Freq: Three times a day (TID) | ORAL | Status: DC

## 2010-11-30 NOTE — Assessment & Plan Note (Signed)
Blood pressure will control today. Patient notes she occasionally goes up to the 90s at night. Asked patient to begin taking her blood pressure medicines at night and see if this helped. Symmetrical today no changes.

## 2010-11-30 NOTE — Assessment & Plan Note (Signed)
Lab Results  Component Value Date   HGBA1C 7.1 11/30/2010   Much improved from last visit.  Currently at 70 units Lantus every morning.

## 2010-11-30 NOTE — Patient Instructions (Signed)
It was nice to see you today. We will leave your insulin regimen just as it is you've been doing great. I have sent in a new prescription for 1200 mg of gabapentin. I hope that this will help you with your leg pain. I would like you to try and see if he can get access to a recumbent bicycle for exercise. This is the kind of bicycle that you have a chair to sit on rather than a bicycle seat.

## 2010-11-30 NOTE — Progress Notes (Signed)
  Subjective:    Patient ID: Christine Bradshaw, female    DOB: Mar 10, 1965, 45 y.o.   MRN: [id number]  HPI  Hypertension-patient taking her meds as prescribed. She denies headaches. She denies chest pain or shortness of breath. She states that occasionally in the evenings her blood pressure will get up to [place] diastolic of 90s. She's concerned that her blood pressure medicine is not doing as well as it should.  Diabetes-patient currently at 70 units of Lantus for last 4-6 weeks. Her typical a.m. fasting sugar is between 110 and 120. Her pre-meal sugars are between 110 to 1:30. After meals she is usually between 140 and 150. She said a few times her she's been low in the 80s. She feels dizzy when this happens. She states this happened after she skips a meal, which is frequent for her.  Hyperlipidemia-patient is able to get lovasa from the [facility] program. She had stop taking TriCor.   Review of Systems See above    Objective:   Physical Exam  Vital signs reviewed General appearance - alert, well appearing, and in no distress and oriented to person, place, and time Heart - normal rate, regular rhythm, normal S1, S2, no murmurs, rubs, clicks or gallops Chest - clear to auscultation, no wheezes, rales or rhonchi, symmetric air entry, no tachypnea, retractions or cyanosis       Assessment & Plan:

## 2010-11-30 NOTE — Assessment & Plan Note (Signed)
She was able to get Lovasa so she has stopped TriCor. She is continuing on her Pravachol.

## 2010-12-13 NOTE — Telephone Encounter (Signed)
This encounter was created in error - please disregard.

## 2011-02-02 ENCOUNTER — Other Ambulatory Visit: Payer: Self-pay | Admitting: Family Medicine

## 2011-02-17 ENCOUNTER — Other Ambulatory Visit: Payer: Self-pay | Admitting: Family Medicine

## 2011-02-17 MED ORDER — ESOMEPRAZOLE MAGNESIUM 40 MG PO CPDR: 40.0000 mg | DELAYED_RELEASE_CAPSULE | Freq: Every day | ORAL | Status: DC

## 2011-03-09 ENCOUNTER — Other Ambulatory Visit: Payer: Self-pay | Admitting: Family Medicine

## 2011-03-16 ENCOUNTER — Other Ambulatory Visit: Payer: Self-pay | Admitting: Family Medicine

## 2011-03-16 MED ORDER — FUROSEMIDE 40 MG PO TABS: 40.0000 mg | ORAL_TABLET | Freq: Every day | ORAL | Status: DC

## 2011-03-21 ENCOUNTER — Ambulatory Visit: Payer: Self-pay | Admitting: Family Medicine

## 2011-04-07 ENCOUNTER — Other Ambulatory Visit: Payer: Self-pay | Admitting: Orthopedic Surgery

## 2011-04-07 DIAGNOSIS — IMO0002 Reserved for concepts with insufficient information to code with codable children: Secondary | ICD-10-CM

## 2011-04-12 ENCOUNTER — Ambulatory Visit
Admission: RE | Admit: 2011-04-12 | Discharge: 2011-04-12 | Disposition: A | Discharge status: Home / Self Care | Payer: No Typology Code available for payment source | Source: Ambulatory Visit | Attending: Orthopedic Surgery | Admitting: Orthopedic Surgery

## 2011-04-12 ENCOUNTER — Other Ambulatory Visit: Payer: Self-pay | Admitting: Family Medicine

## 2011-04-12 DIAGNOSIS — IMO0002 Reserved for concepts with insufficient information to code with codable children: Secondary | ICD-10-CM

## 2011-04-12 MED ORDER — QUINAPRIL HCL 20 MG PO TABS: 20.0000 mg | ORAL_TABLET | Freq: Every day | ORAL | Status: DC

## 2011-04-21 ENCOUNTER — Ambulatory Visit (INDEPENDENT_AMBULATORY_CARE_PROVIDER_SITE_OTHER): Payer: No Typology Code available for payment source | Admitting: Family Medicine

## 2011-04-21 ENCOUNTER — Encounter: Payer: Self-pay | Admitting: Family Medicine

## 2011-04-21 VITALS — BP 134/85 | HR 90 | Temp 98.0°F | Ht 64.0 in | Wt 301.0 lb

## 2011-04-21 DIAGNOSIS — L989 Disorder of the skin and subcutaneous tissue, unspecified: Secondary | ICD-10-CM

## 2011-04-21 DIAGNOSIS — Z23 Encounter for immunization: Secondary | ICD-10-CM

## 2011-04-21 DIAGNOSIS — F17200 Nicotine dependence, unspecified, uncomplicated: Secondary | ICD-10-CM

## 2011-04-21 DIAGNOSIS — E119 Type 2 diabetes mellitus without complications: Secondary | ICD-10-CM

## 2011-04-21 DIAGNOSIS — I10 Essential (primary) hypertension: Secondary | ICD-10-CM

## 2011-04-21 DIAGNOSIS — Z720 Tobacco use: Secondary | ICD-10-CM

## 2011-04-21 LAB — POCT UA - MICROALBUMIN
Creatinine, POC: 100 mg/dL
Microalbumin Ur, POC: 30 mg/dL

## 2011-04-21 LAB — POCT GLYCOSYLATED HEMOGLOBIN (HGB A1C): Hemoglobin A1C: 6.1

## 2011-04-21 NOTE — Patient Instructions (Signed)
Thank you for coming in today I am so glad that you have already started thinking about stopping smoking We have checked your labs today and I will call you with the results I would like you to come back in one month to make sure that you are doing okay  I will call you with the results of your foot scraping and we can consider starting a medicine for this

## 2011-04-22 LAB — COMPREHENSIVE METABOLIC PANEL
Albumin: 4.4 g/dL (ref 3.5–5.2)
Alkaline Phosphatase: 84 U/L (ref 39–117)
BUN: 15 mg/dL (ref 6–23)
CO2: 26 mEq/L (ref 19–32)
Calcium: 9.3 mg/dL (ref 8.4–10.5)
Chloride: 100 mEq/L (ref 96–112)
Glucose, Bld: 83 mg/dL (ref 70–99)
Potassium: 4.3 mEq/L (ref 3.5–5.3)
Sodium: 138 mEq/L (ref 135–145)
Total Protein: 6.9 g/dL (ref 6.0–8.3)

## 2011-04-25 ENCOUNTER — Encounter: Payer: Self-pay | Admitting: Family Medicine

## 2011-04-25 DIAGNOSIS — Z720 Tobacco use: Secondary | ICD-10-CM | POA: Insufficient documentation

## 2011-04-25 NOTE — Assessment & Plan Note (Signed)
Patient with area of dry skin on the left foot that has persisted x6 months. Advise to continue to elevate legs and use compression hose. This area was negative for tinea on scraping. Plan to refer to podiatrist.

## 2011-04-25 NOTE — Assessment & Plan Note (Signed)
Patient was a previous smoker and is now smoking 1-1.5 packs per day. She's been doing so for 4-5 months. She plans to use electronic cigarettes to stop smoking. She plans to start this next week. I will see her back in one month for followup.

## 2011-04-25 NOTE — Progress Notes (Signed)
  Subjective:    Patient ID: Christine Bradshaw, female    DOB: 1965-10-05, 46 y.o.   MRN: [id number]  HPI  HYPERTENSION Disease Monitoring Blood pressure range-not checking at home Chest pain- none      Dyspnea- none Medications Compliance- taking without trouble Lightheadedness- none   Edema- significant, worsening. This gets better when she lifts her feet. She does not wear her compression hose. Patient notes a spot on her left foot in the arch it has been dry and crusting. This has been there for 6 months. She has tried Tinactin but is not better.  DIABETES Disease Monitoring Blood Sugar ranges-108-130 in AM Polyuria- none  New Visual problems- none Medications Compliance- taking Hypoglycemic symptoms- taking   HYPERLIPIDEMIA Disease Monitoring See symptoms for Hypertension Medications Compliance- taking RUQ pain- none  Muscle aches- none  Patient started smoking again 4 months ago. She is now smoking one to one and half packs per day. Last time she "with electronic cigarettes and she thinks she can do this again. She thinks she will try to stop smoking next week. ROS See HPI above   PMH Smoking Status noted    Review of Systems See above    Objective:   Physical Exam  Vital signs reviewed General appearance - alert, well appearing, and in no distress Heart - normal rate, regular rhythm, normal S1, S2, no murmurs, rubs, clicks or gallops Chest - clear to auscultation, no wheezes, rales or rhonchi, symmetric air entry, no tachypnea, retractions or cyanosis Foot exam-bottoms of feet are without ulceration or discoloration. She has some decreased sensation at the heels and balls of feet. She has an area of thickened skin on the left arch. Legs-1+ edema to the midcalf. DP pulses present bilaterally.      Assessment & Plan:

## 2011-04-25 NOTE — Assessment & Plan Note (Signed)
Lab Results  Component Value Date   HGBA1C 6.1 04/21/2011   A1c well controlled. A.m. CBGs between 109 and 123. Only gets low with skipped meals and can state a plan for her hypoglycemia. Continue current regimen.

## 2011-04-25 NOTE — Assessment & Plan Note (Signed)
BP Readings from Last 3 Encounters:  04/21/11 134/85  11/30/10 134/82  11/17/10 130/85   blood pressure at goal. Continue current regimen.

## 2011-05-13 ENCOUNTER — Other Ambulatory Visit: Payer: Self-pay | Admitting: Family Medicine

## 2011-05-13 DIAGNOSIS — E785 Hyperlipidemia, unspecified: Secondary | ICD-10-CM

## 2011-05-13 MED ORDER — METFORMIN HCL 1000 MG PO TABS: 1000.0000 mg | ORAL_TABLET | Freq: Two times a day (BID) | ORAL | Status: DC

## 2011-05-13 MED ORDER — PRAVASTATIN SODIUM 80 MG PO TABS: 80.0000 mg | ORAL_TABLET | Freq: Every evening | ORAL | Status: DC

## 2011-05-20 ENCOUNTER — Ambulatory Visit (INDEPENDENT_AMBULATORY_CARE_PROVIDER_SITE_OTHER): Payer: Self-pay | Admitting: Family Medicine

## 2011-05-20 ENCOUNTER — Encounter: Payer: Self-pay | Admitting: Family Medicine

## 2011-05-20 VITALS — BP 105/74 | HR 90 | Ht 64.0 in | Wt 297.0 lb

## 2011-05-20 DIAGNOSIS — L989 Disorder of the skin and subcutaneous tissue, unspecified: Secondary | ICD-10-CM

## 2011-05-20 MED ORDER — FLUCONAZOLE 150 MG PO TABS: 150.0000 mg | ORAL_TABLET | ORAL | Status: AC

## 2011-05-20 NOTE — Progress Notes (Signed)
  Subjective:    Patient ID: Christine Bradshaw, female    DOB: Nov 26, 1965, 46 y.o.   MRN: [id number]  HPI Lesion on foot-patient with growing lesion on her foot. This is crusty and scaly. The skin will peel off but then will come back. She is putting Neosporin on it. She's tried antifungal medication on and off as well. She does not think anything rubs in this area. It is tender. She has not had any fevers in the foot does swelling currently but is not worse than usual.  Tobacco use-patient is on to one pack per day. She's using electronic cigarettes. She is not on any medications and she does not want to see the pharmacist. She wants to continue to cut down on her own.   Review of Systems Denies CP, SOB, HA, N/V/D, fever     Objective:   Physical Exam  Vital signs reviewed General appearance - alert, well appearing, and in no distress Skin-there is a 3 cm x 2 cm lesion in the arch of her left foot. It is scaly and slightly purple underneath. It is tender to palpation. There is no evidence of any pus or fluctuance. She does have a DP pulse. It does not seem to be deep it is very superficial      Assessment & Plan:  Skin lesion of left leg Scaly skin lesion of left foot. Enlarged. Plan Diflucan x4-6 weeks. See back in 4 to reevaluation. Will have patient see podiatry as well.

## 2011-05-20 NOTE — Patient Instructions (Signed)
Stop Neosporin and use only Vaseline on the area I will give you a prescription for Diflucan to take once weekly for 4-6 weeks In 4 weeks come back and let me take a look at it and we can decide if we will continue for 6 weeks  We are working on getting [facility]   Keep working on the smoking. Cut down little by little until your done

## 2011-05-20 NOTE — Assessment & Plan Note (Signed)
Scaly skin lesion of left foot. Enlarged. Plan Diflucan x4-6 weeks. See back in 4 to reevaluation. Will have patient see podiatry as well.

## 2011-07-20 ENCOUNTER — Ambulatory Visit: Payer: Self-pay | Admitting: Family Medicine

## 2011-07-29 ENCOUNTER — Ambulatory Visit: Payer: Self-pay | Admitting: Family Medicine

## 2011-08-15 ENCOUNTER — Other Ambulatory Visit: Payer: Self-pay | Admitting: Cardiology

## 2011-08-15 DIAGNOSIS — E1159 Type 2 diabetes mellitus with other circulatory complications: Secondary | ICD-10-CM

## 2011-08-16 ENCOUNTER — Encounter (INDEPENDENT_AMBULATORY_CARE_PROVIDER_SITE_OTHER): Payer: Self-pay

## 2011-08-16 DIAGNOSIS — I739 Peripheral vascular disease, unspecified: Secondary | ICD-10-CM

## 2011-08-16 DIAGNOSIS — E1159 Type 2 diabetes mellitus with other circulatory complications: Secondary | ICD-10-CM

## 2011-08-23 ENCOUNTER — Encounter: Payer: Self-pay | Admitting: Family Medicine

## 2011-08-23 ENCOUNTER — Ambulatory Visit (INDEPENDENT_AMBULATORY_CARE_PROVIDER_SITE_OTHER): Payer: Self-pay | Admitting: Family Medicine

## 2011-08-23 VITALS — BP 144/97 | HR 89 | Temp 98.4°F | Ht 64.0 in | Wt 302.0 lb

## 2011-08-23 DIAGNOSIS — E119 Type 2 diabetes mellitus without complications: Secondary | ICD-10-CM

## 2011-08-23 DIAGNOSIS — L03213 Periorbital cellulitis: Secondary | ICD-10-CM | POA: Insufficient documentation

## 2011-08-23 DIAGNOSIS — H00039 Abscess of eyelid unspecified eye, unspecified eyelid: Secondary | ICD-10-CM

## 2011-08-23 LAB — POCT GLYCOSYLATED HEMOGLOBIN (HGB A1C): Hemoglobin A1C: 5.7

## 2011-08-23 MED ORDER — SULFAMETHOXAZOLE-TRIMETHOPRIM 800-160 MG PO TABS: 1.0000 | ORAL_TABLET | Freq: Two times a day (BID) | ORAL | Status: AC

## 2011-08-23 NOTE — Assessment & Plan Note (Addendum)
Eye swelling started yesterday (L>R). Preseptal cellulitis likely from scratch on nasal bridge that she sustained 1 week ago.  -Bactrim DS bid for 14 days in diabetic patient -Given indications to RTC or go to ED -Follow-up in 2 days

## 2011-08-23 NOTE — Progress Notes (Signed)
  Subjective:    Patient ID: Christine Bradshaw, female    DOB: October 09, 1965, 46 y.o.   MRN: [id number]  HPI # Eye swelling She scratched her face last week (nasal bridge between eyes) while washing her face. Yesterday, her left eye started swelling and today her right eye is swollen. The swelling is getting worse in both eyes. She denies eye pain (including with movement), worsening vision, difficulty breathing. She denies new exposure or new environments, including being out in the woods.  Review of Systems Denies fevers/chills/nausea Denies other rash   Allergies, medication, past medical history reviewed.  Significant for: T2 DM, well controlled, but with diabetic neuropathy. HgbA1c 11/2010 7.1 HLD, HTN Morbid obesity Bipolar disorder, anxiety, depression IBS, chronic back pain, chronic headache Tobacco abuse--1ppd    Objective:   Physical Exam GEN: NAD HEENT:   Scratch with yellow crusting on nasal bridge    Normal conjunctiva and EOMI bilaterally but eyes are markedly swollen with left > right. She is not able to see out of her R eye because of the swelling.    No nasal congestion/rhinorrhea PULM: NI WOB; CTAB without ronchi    Assessment & Plan:

## 2011-08-23 NOTE — Patient Instructions (Addendum)
You have an infection of your skin.   Take the antibiotic twice daily for 14 days Eat a yogurt a day while you are on the antibiotic to prevent diarrhea  If you develop eye pain, worsening swelling or fevers (101.5 or greater) 24 hours after you start the antibiotic, please return to clinic or go to the ED.  Follow-up with Dr. Madolyn Frieze on Thursday morning.

## 2011-08-25 ENCOUNTER — Ambulatory Visit (INDEPENDENT_AMBULATORY_CARE_PROVIDER_SITE_OTHER): Payer: Self-pay | Admitting: Family Medicine

## 2011-08-25 ENCOUNTER — Encounter: Payer: Self-pay | Admitting: Family Medicine

## 2011-08-25 VITALS — BP 92/63 | HR 114 | Temp 98.6°F

## 2011-08-25 DIAGNOSIS — L03213 Periorbital cellulitis: Secondary | ICD-10-CM

## 2011-08-25 DIAGNOSIS — I10 Essential (primary) hypertension: Secondary | ICD-10-CM

## 2011-08-25 DIAGNOSIS — E785 Hyperlipidemia, unspecified: Secondary | ICD-10-CM

## 2011-08-25 DIAGNOSIS — L989 Disorder of the skin and subcutaneous tissue, unspecified: Secondary | ICD-10-CM

## 2011-08-25 DIAGNOSIS — E119 Type 2 diabetes mellitus without complications: Secondary | ICD-10-CM

## 2011-08-25 DIAGNOSIS — H00039 Abscess of eyelid unspecified eye, unspecified eyelid: Secondary | ICD-10-CM

## 2011-08-25 DIAGNOSIS — Z794 Long term (current) use of insulin: Secondary | ICD-10-CM

## 2011-08-25 MED ORDER — INSULIN GLARGINE 100 UNIT/ML ~~LOC~~ SOLN: 65.0000 [IU] | Freq: Every day | SUBCUTANEOUS | Status: DC

## 2011-08-25 NOTE — Assessment & Plan Note (Signed)
Documentation only. Improving per patient. Still seeing podiatrist.

## 2011-08-25 NOTE — Assessment & Plan Note (Signed)
She has occasional hypoglycemic episodes HgbA1c 5.7 -Will decrease Lantus from 70 to 65. Advised to decrease to 60 if CBG < 80

## 2011-08-25 NOTE — Progress Notes (Signed)
  Subjective:    Patient ID: TYLEE YUM, female    DOB: 06-20-1965, 46 y.o.   MRN: [id number]  HPI # Preseptal cellulitis Took 3 doses of Bactrim It is improving She has a headache and pain behind her left eye that is not exacerbated by movement She feels tired and achey She denies fevers/chills/nausea  # Diabetes HgbA1c 5.7 a few days ago She checks her sugars 3-4 times a day. Usually in the 100-150s. She has had a few lows, such as 26 She reports feeling lightheaded during these times  # Hypertriglyceridemia Lovaza is making her gassy She has cut back to 1 tablet a day but it is still making her uncomfortable  Review of Systems Per HPI  Allergies, medication, past medical history reviewed.  Significant for: -Edema of both legs--on Lasix 40 -Tobacco abuse -T2 DM, on insulin--well controlled. Lantus 70 qd, metformin, Januvia   Diabetic neuropathy--on Neurontin 600 tid -HLD, obesity -Bipolar disorder, anxiety, depression--on Klonpin tid prn, Cymbalta, Lamictal, Risperdal, Trazodone -HTN -Chronic back pain, chronic headache--on Lorcet 10/650 q 4 prn (prescribed by Dr. Alveda Reasons) -IBS--on Nexium -Left foot lesion--being followed by podiatrist. Improving.    Objective:   Physical Exam GEN: NAD HEENT: improvement of swelling over both eyes. Her left eye is now visible. EOMI without pain with movement; MMM FEET: sensation decreased bilaterally; 1-2+ pedal pulses bilaterally; dry and flaky; 3 cm circumscribed hyperpigmented macule inner sole left foot without warmth or surrounding erythema    Assessment & Plan:

## 2011-08-25 NOTE — Patient Instructions (Addendum)
Finish the antibiotic  Decrease Lantus to 65 units If your sugars are still < 80, decrease to 60 units Bring a log of your sugars to your next visit  Bring your medications to your next visit  1 week after you finish your antibiotic, try stopping the Lovaza to see if the gas improves  Follow-up in 2 weeks

## 2011-08-25 NOTE — Assessment & Plan Note (Signed)
Well controlled with Pravachol 80 However, Lovaza for her hypertriglyceridemia may be causing excessive flatulence -After she finishes antibiotic for preseptal cellulitis, try trial off Lovaza to see if symptoms improve. Advised to follow-up in 2 weeks. Will check FLP at that time. If TG persistently elevated (>500), consider adding Tricor

## 2011-08-25 NOTE — Assessment & Plan Note (Signed)
Improving with antibiotics °

## 2011-08-25 NOTE — Assessment & Plan Note (Addendum)
Blood pressure is low today She takes Lasix intermittently and did take a dose on Tuesday after she left clinic. Her BP on that day was higher SBP 140s -Hold Lasix for at least a week She uses it intermittently for leg swelling -Will re-assess at follow-up in 2 weeks -Stay well hydrated

## 2011-09-07 ENCOUNTER — Other Ambulatory Visit: Payer: Self-pay | Admitting: Family Medicine

## 2011-09-07 ENCOUNTER — Other Ambulatory Visit: Payer: Self-pay

## 2011-09-07 DIAGNOSIS — E785 Hyperlipidemia, unspecified: Secondary | ICD-10-CM

## 2011-09-07 LAB — LIPID PANEL
HDL: 23 mg/dL — ABNORMAL LOW (ref >39)
Total CHOL/HDL Ratio: 6.3 Ratio

## 2011-09-07 NOTE — Progress Notes (Signed)
FLP DONE TODAY Breeonna Mone 

## 2011-09-09 ENCOUNTER — Encounter: Payer: Self-pay | Admitting: Family Medicine

## 2011-09-09 ENCOUNTER — Ambulatory Visit (INDEPENDENT_AMBULATORY_CARE_PROVIDER_SITE_OTHER): Payer: Self-pay | Admitting: Family Medicine

## 2011-09-09 VITALS — BP 118/81 | HR 82 | Temp 97.9°F | Ht 64.0 in | Wt 299.0 lb

## 2011-09-09 DIAGNOSIS — L989 Disorder of the skin and subcutaneous tissue, unspecified: Secondary | ICD-10-CM

## 2011-09-09 DIAGNOSIS — K5900 Constipation, unspecified: Secondary | ICD-10-CM

## 2011-09-09 DIAGNOSIS — E669 Obesity, unspecified: Secondary | ICD-10-CM

## 2011-09-09 DIAGNOSIS — I10 Essential (primary) hypertension: Secondary | ICD-10-CM

## 2011-09-09 DIAGNOSIS — E785 Hyperlipidemia, unspecified: Secondary | ICD-10-CM

## 2011-09-09 DIAGNOSIS — Z794 Long term (current) use of insulin: Secondary | ICD-10-CM

## 2011-09-09 DIAGNOSIS — E119 Type 2 diabetes mellitus without complications: Secondary | ICD-10-CM

## 2011-09-09 DIAGNOSIS — R600 Localized edema: Secondary | ICD-10-CM

## 2011-09-09 DIAGNOSIS — R609 Edema, unspecified: Secondary | ICD-10-CM

## 2011-09-09 MED ORDER — INSULIN GLARGINE 100 UNIT/ML ~~LOC~~ SOLN: 70.0000 [IU] | Freq: Every day | SUBCUTANEOUS | Status: DC

## 2011-09-09 MED ORDER — POLYETHYLENE GLYCOL 3350 17 GM/SCOOP PO POWD: 17.0000 g | Freq: Two times a day (BID) | ORAL | Status: AC

## 2011-09-09 NOTE — Assessment & Plan Note (Signed)
Hypotension improved today. She did not take Lasix recently.

## 2011-09-09 NOTE — Assessment & Plan Note (Signed)
She takes Lasix prn.

## 2011-09-09 NOTE — Progress Notes (Signed)
  Subjective:    Patient ID: Christine Bradshaw, female    DOB: 11-10-1965, 46 y.o.   MRN: [id number]  HPI # Constipation She has a bowel movement once a week  # Hyperlipidemia, -triglyceridemia She is taking Lovaza 1 tableta day  # Obesity She would like to lose weight and start eating better  # LE edema She continues to take Lasix infrequently prn She did not take yesterday or today  # DM She is compliant with Lantus and Novolog She is still taking Lantus 70 units Her BS range 100-120s usually. Lowest 90, highest 143.   Review of Systems Denies diarrhea, abdominal pain. Complaining of flatulence.   Allergies, medication, past medical history reviewed.      Objective:   Physical Exam GEN: NAD; morbidly obese CV: RRR PULM: NI WOB ABD: NABS, soft, NT, obese EXT: 1+ pretibial edema bilaterally    Assessment & Plan:

## 2011-09-09 NOTE — Assessment & Plan Note (Signed)
She is still taking Lantus 70 units. She does not want to go down to 65 units. She thinks she had hypoglycemic episodes in the past because she does not eat regularly.  -Continue Lantus 70 for now -Eat regular meals -Consider decreasing if symptomatic hypoglycemia

## 2011-09-09 NOTE — Assessment & Plan Note (Signed)
-  Start Miralax 17 g bid, titrate as needed

## 2011-09-09 NOTE — Patient Instructions (Addendum)
For constipation: -Take Miralax   Start with 1 capful twice a day   Increase to 2 capfuls twice a day if you are still constipated   Decrease to 1 capful once a day if you have too much stool  Follow-up in 2 weeks  Please call Dr. Gerilyn Pilgrim and set up an appointment to talk to her about your diet and weight. [phone]

## 2011-09-09 NOTE — Assessment & Plan Note (Signed)
-  Continue Lovaza. It seem to be helping. Her TG in the 800s in the past. She thinks this may be causing abdominal discomfort (flatulence, bloating). We will treat constipation and then re-assess in a couple of weeks.

## 2011-09-09 NOTE — Assessment & Plan Note (Signed)
Documentation only. She is getting a biopsy soon by podiatrist.

## 2011-09-09 NOTE — Assessment & Plan Note (Signed)
We will refer to Dr. Gerilyn Pilgrim. Patient asked to call and schedule appointment.

## 2011-09-26 ENCOUNTER — Ambulatory Visit: Payer: Self-pay | Admitting: Family Medicine

## 2011-09-29 ENCOUNTER — Other Ambulatory Visit: Payer: Self-pay | Admitting: Family Medicine

## 2011-09-29 MED ORDER — DESLORATADINE 5 MG PO TABS: 5.0000 mg | ORAL_TABLET | Freq: Every day | ORAL | Status: DC

## 2011-10-26 ENCOUNTER — Other Ambulatory Visit: Payer: Self-pay | Admitting: Family Medicine

## 2011-10-26 MED ORDER — OMEGA-3-ACID ETHYL ESTERS 1 G PO CAPS: 2.0000 g | ORAL_CAPSULE | Freq: Two times a day (BID) | ORAL | Status: DC

## 2011-11-04 ENCOUNTER — Telehealth: Payer: Self-pay | Admitting: *Deleted

## 2011-11-04 ENCOUNTER — Encounter: Payer: Self-pay | Admitting: Family Medicine

## 2011-11-04 NOTE — Telephone Encounter (Signed)
Patient was in waiting room with her daughter who is here to see Dr. Madolyn Frieze.  I was asked to check on patient because when daughter had been ask to come to desk to check in  she  stated  Just a minute my  mother  Has passed out but that's OK my sister will be in in a minute to help with her. Daughter states she does this daily. 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   Patient seemed dazed , starring down .  Eyes were open but not comprehending . CGB checked with result of 141. Within 2 minutes patient is alert and talking and states she has had this problem for 18 years.  She nor her daughters are concerned about this , stating it happens all the time and she will come around.    Dr. Madolyn Frieze was notified and she will  talk with patient when she comes back with her  Daughter.

## 2011-11-04 NOTE — Telephone Encounter (Signed)
Advised patient that

## 2011-11-06 NOTE — Telephone Encounter (Signed)
I spoke with the patient when she was in the room for her daughter's visit. These spells happen to her about once a day and she "zones out" for a couple of minutes. 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 She feels fine and her normal self at this time. I have advised she make an appointment to discuss this issue. We may need to check an EEG and consider starting anti-epileptics. She has the orange card, so it will be difficult for her to see a neurologist except at [facility] where she will have to make payments, although she may get a discount. She would like to see a specialist but not if she has to pay.

## 2011-11-14 ENCOUNTER — Ambulatory Visit (INDEPENDENT_AMBULATORY_CARE_PROVIDER_SITE_OTHER): Payer: PRIVATE HEALTH INSURANCE | Admitting: Family Medicine

## 2011-11-14 ENCOUNTER — Encounter: Payer: Self-pay | Admitting: Family Medicine

## 2011-11-14 VITALS — Ht 64.0 in | Wt 299.5 lb

## 2011-11-14 DIAGNOSIS — E119 Type 2 diabetes mellitus without complications: Secondary | ICD-10-CM

## 2011-11-14 DIAGNOSIS — Z794 Long term (current) use of insulin: Secondary | ICD-10-CM

## 2011-11-14 DIAGNOSIS — E669 Obesity, unspecified: Secondary | ICD-10-CM

## 2011-11-14 NOTE — Patient Instructions (Addendum)
-   Diet Recommendations for Diabetes   Starchy (carb) foods include: Bread, rice, pasta, potatoes, corn, crackers, bagels, muffins, all baked goods.   Protein foods include: Meat, fish, poultry, eggs, dairy foods, and beans such as pinto and kidney beans (beans also provide carbohydrate).   1. Eat at least 3 meals and 1-2 snacks per day.  Aim for no more than 5 hours between eating.   - Breakfast:  Include a source of protein, i.e.,    - 2 eggs with 1 slice toast & 1 piece of fresh fruit    - [place] sandwich on one slice of bread with a piece of fruit   - 1 cup of yogurt with 1 piece of fresh fruit (experiment with mixing some sweetened yogurt with plain, nonfat yogurt)  2. Limit starchy foods to TWO per meal and ONE per snack. ONE portion of a starchy food is equal to the following:   - ONE slice of bread (or its equivalent, such as half of a hamburger bun).   - 1/2 cup of a "scoopable" starchy food such as potatoes or rice.   - 1 OUNCE of starchy snack foods such as crackers or pretzels (look on label).   - 15 grams of carbohydrate as shown on food label.  3. Both lunch and dinner should include a protein food, a carb food, and vegetables.   - Obtain twice as many veg's as protein or carbohydrate foods for both lunch and dinner.   - Try to keep frozen veg's on hand for a quick vegetable serving.     - Fresh or frozen veg's are best.  ______________________________________________________________________________  - Before the weekend is over, please complete:   - A list of 7-10 meals that conform to the framework of a protein source, starchy (carbohydrate), and vegetables.    - Email that list to [email or web address].  (Call if you don't get a return email in a couple days:  [phone].)   - Until you can get into a dedicated exercise routine, at least move more throughout your day.

## 2011-11-14 NOTE — Progress Notes (Signed)
Medical Nutrition Therapy:  Appt start time: 1530 end time:  1630.  Assessment:  Primary concerns today: Blood sugar control.  Sury said she wants to know what to eat, and how often.  She said every dr she's seen has told her to eat 4-6 X day, but she has never managed to do so.   Usual eating pattern includes 1 meal and 1 snack per day. Usual physical activity includes none due to depression and back pain (back surgery 2 yrs ago, from which she has not fully recovered).   FBG have been running 90-120s.  She takes 70 u Lantus in the AM, with Novolog as needed (if 160 or higher).  Everyday foods include 64 oz diet sweet tea.  Avoided foods include beans (b/c she was told they raise her BG), cauliflower.   24-hr recall: (Up at 9 AM) B (9:30 AM)-   1 biscuit w/ 1/4 gravy, water  Snk ( AM)-   none L (3:30 PM)-  1 bagel w/ 2 tbsp cream cheese, diet sweet tea Snk ( PM)-  none D (8:30 PM)-  5 fried medium shrimp, 1/4 c coleslaw, baked potato w/ 1 tbsp butter & 1 tbsp sour cream, diet sweet tea Snk ( PM)-  none This was more than usual for [place].  She rarely eats breakfast, and a more likely snack would be 1/2 c trail mix or fruit.    Progress Towards Goal(s):  In progress.   Nutritional Diagnosis:  NB-2.1 Physical inactivity As related to back pain & depression.  As evidenced by no exercise at this time.    Intervention:  Nutrition education.  Monitoring/Evaluation:  Dietary intake, exercise, BG, and body weight in 3 week(s).

## 2011-11-25 ENCOUNTER — Ambulatory Visit: Payer: Self-pay | Admitting: Family Medicine

## 2011-12-05 ENCOUNTER — Encounter: Payer: Self-pay | Admitting: Family Medicine

## 2011-12-05 ENCOUNTER — Ambulatory Visit (INDEPENDENT_AMBULATORY_CARE_PROVIDER_SITE_OTHER): Payer: PRIVATE HEALTH INSURANCE | Admitting: Family Medicine

## 2011-12-05 VITALS — Ht 64.0 in | Wt 304.7 lb

## 2011-12-05 DIAGNOSIS — E669 Obesity, unspecified: Secondary | ICD-10-CM

## 2011-12-05 DIAGNOSIS — E119 Type 2 diabetes mellitus without complications: Secondary | ICD-10-CM

## 2011-12-05 DIAGNOSIS — Z794 Long term (current) use of insulin: Secondary | ICD-10-CM

## 2011-12-05 NOTE — Patient Instructions (Addendum)
-   Consider moving your recliner to your bedroom, which would allow you to go to sleep whenever you feel like it (instead of at 3 AM when the house is quiet.) - RECOMMENDATION:  START MOVING YOUR BEDTIME UP EARLIER TO SLEEP DURING MORE NORMAL SLEEP HOURS.   - Use the handouts provided today to make some meals that are nutritionally balanced.  - Read the handout on Getting Thru the Holidays provided today.   - Try the oatmeal recipe provided, and see if this works better for your blood sugar than regular oatmeal.

## 2011-12-05 NOTE — Progress Notes (Signed)
Medical Nutrition Therapy:  Appt start time: 1530 end time:  1630.  Assessment:  Primary concerns today: Blood sugar control.  Marguetta sleeps in a recliner b/c of back pain; goes to sleep around 3 AM till 6 AM, then sleeps again from 6:30 till 11.  She said she cannot go to bed earlier b/c there is always so much going on in her house, and her recliner is out in the living room.  The early morning hours are the only time she gets to herself.  Shelda said she emailed a menu to me, but I did not receive it.  She insisted that she needs help in designing menus for herself, so I provided her with three days of menus and a template for her to determine protein sources, starch sources, and veg's for lunch and dinner meals.  We practiced use of it, and she seemed enthusiastic about using this template.   Still no exercise; depression remains pretty severe.    24-hr recall:  (Up at 11 AM) B ( AM)-  1/2 [place] sandwich w/ 1 tbsp mayo, handful corn chips, 2 c diet sweet tea Snk ( AM)-  none L ( PM)-  none Snk (3 PM)-  1 banana D (7 PM)-  8 breaded fried shrimp, baked potato w/ 1 tbsp each butter & sourcream, 1 c canned green beans, 2 c Pepsi Snk ( PM)-  none  Progress Towards Goal(s):  In progress.   Nutritional Diagnosis:  NB-2.1 Physical inactivity As related to back pain & depression.  As evidenced by no exercise at this time.    Intervention:  Nutrition education.  Monitoring/Evaluation:  Dietary intake, exercise, BG, and body weight in 3 week(s).

## 2011-12-07 ENCOUNTER — Other Ambulatory Visit: Payer: Self-pay | Admitting: Family Medicine

## 2011-12-07 MED ORDER — GABAPENTIN (ONCE-DAILY) 600 MG PO TABS: 1.0000 | ORAL_TABLET | Freq: Three times a day (TID) | ORAL | Status: DC

## 2011-12-08 ENCOUNTER — Telehealth: Payer: Self-pay | Admitting: *Deleted

## 2011-12-08 NOTE — Telephone Encounter (Signed)
Received call from [facility] pharmacy  stating they received rx from Dr. Madolyn Frieze for gabapentin for 600 mg one tab three times daily. Patient had been on 600 mg two three times daily.  Previous RX was from Dr. Hulen Luster from 11/30/2010 and was last filled on 10/24/2011.Marland Kitchen See note from Dr. Hulen Luster 11/30/2010.    Will send message to  Dr. Bluford Kaufmann park to clarify.

## 2011-12-11 MED ORDER — GABAPENTIN (ONCE-DAILY) 600 MG PO TABS: 2.0000 | ORAL_TABLET | Freq: Three times a day (TID) | ORAL | Status: DC

## 2011-12-11 NOTE — Telephone Encounter (Signed)
Hi Lynn. I am sorry for the mistake. Will you please notify [facility] and send in Rx for gabapentin 1200 mg tid #180 3 RF? Thank you.

## 2011-12-12 NOTE — Telephone Encounter (Signed)
Called [facility] and gave new RX for Gabapentin.

## 2011-12-14 ENCOUNTER — Ambulatory Visit (INDEPENDENT_AMBULATORY_CARE_PROVIDER_SITE_OTHER): Payer: PRIVATE HEALTH INSURANCE | Admitting: Family Medicine

## 2011-12-14 VITALS — BP 115/81 | HR 120 | Temp 98.4°F | Ht 64.5 in | Wt 297.1 lb

## 2011-12-14 DIAGNOSIS — Z794 Long term (current) use of insulin: Secondary | ICD-10-CM

## 2011-12-14 DIAGNOSIS — K589 Irritable bowel syndrome without diarrhea: Secondary | ICD-10-CM

## 2011-12-14 DIAGNOSIS — M549 Dorsalgia, unspecified: Secondary | ICD-10-CM

## 2011-12-14 DIAGNOSIS — E119 Type 2 diabetes mellitus without complications: Secondary | ICD-10-CM

## 2011-12-14 DIAGNOSIS — Z23 Encounter for immunization: Secondary | ICD-10-CM

## 2011-12-14 LAB — POCT GLYCOSYLATED HEMOGLOBIN (HGB A1C): Hemoglobin A1C: 5.9

## 2011-12-14 NOTE — Progress Notes (Signed)
  Subjective:    Patient ID: Christine Bradshaw, female    DOB: July 25, 1965, 46 y.o.   MRN: [id number]  HPI # Abdominal discomfort She has had a lot of gas with bloating and intermittent constipation "all my life". These symptoms are about the same, but she has noticed increasing frequency of getting underwear soil with passage of gas.  She has tried Miralax but found that her stools were excessively loose.  She now has bowel movements that are long and thing every day. It has a loose consistently. She denies diarrhea.  She has occasional abdominal pain but not significant.  She denies blood in her stool.   She had a colonoscopy 3 years ago and polyps were found. Her next one will be in 2 years.   Review of Systems  Allergies, medication, past medical history reviewed.  Diabetes, well controlled. Today's A1c 5.9. She is on insulin.  She has chronic left buttock pain following her back surgery (lumbar fusion for ruptured discs November 2011). The numbness is not worsening. She denies saddle anesthesia.  She has baseline what appears to be urge incontinence. She does not feel like this is getting worse. She denies dysuria.  Morbid obesity  Smokes 1.5 ppd    Objective:   Physical Exam GEN: NAD; morbidly obese ABD: NABS, soft, NT, obese, no masses palpable RECTUM: anal wink present, normal rectal tone, normal movement of rectum with bearing down, hemoccult negative    Assessment & Plan:

## 2011-12-14 NOTE — Assessment & Plan Note (Addendum)
She has a lot of gas with belching and occasional constipation.  We spoke at length about this diagnosis and potential treatment modalities.  She is opposed to taking medications at this time.  At the end of our conversation, patient was amenable to trying to eat more frequent healthy meals (frequency and cost of healthy foods limits her sometimes) as much as she can and trying to include more physical activity in her life (even walking 5-10 minutes at a time a few times a day).  Also avoid gas-producing foods: beans, onions, celery, carrots, raisins, bananas, apricots, prunes, brussel sprouts, wheat germ, pretzels, and bagels. She was advised to keep food diary and monitor intake of gluten and lactose.  She is aware that her obesity is an issue. We spoke about trying to make small changes to incorporate into a healthier lifestyle.  Follow-up in 2 weeks to discuss how things are going. We may consider weight loss medications or IBS medications at that time.

## 2011-12-14 NOTE — Patient Instructions (Addendum)
Let's try eating every 4 hours during the day (even if it is really small like a few crackers).  Try to exercise a couple of times a day, such as going walking for 5 minutes.   Stop the Miralax.  Follow-up in 2 weeks.

## 2012-01-16 ENCOUNTER — Ambulatory Visit (INDEPENDENT_AMBULATORY_CARE_PROVIDER_SITE_OTHER): Payer: PRIVATE HEALTH INSURANCE | Admitting: Family Medicine

## 2012-01-16 ENCOUNTER — Encounter: Payer: Self-pay | Admitting: Family Medicine

## 2012-01-16 VITALS — Ht 64.0 in | Wt 293.0 lb

## 2012-01-16 DIAGNOSIS — E669 Obesity, unspecified: Secondary | ICD-10-CM

## 2012-01-16 NOTE — Progress Notes (Signed)
Medical Nutrition Therapy:  Appt start time: 1530 end time:  1630.  Assessment:  Primary concerns today: Blood sugar control.  Christine Bradshaw was accompanied by her daughter Christine Bradshaw today, who is a Holiday representative at [employer]. (plans to be at [employer] [employer] next yr in the pre-nursing program).  Christine Bradshaw said she has been avoiding bread most of the time except for one sandwich per day b/c it causes gas & discomfort.  In reality, she has eaten very little of any foods, as indicated by her >7-lb weight loss since I saw her last.  Christine Bradshaw has still been sleeping in her recliner most nights, and she said her husband refuses to move the recliner to the bedroom.  Usual routine is to fall asleep ~8 PM, wake ~10 PM, take medications at 11 PM, then stay awake till 3 AM or so.   Checks BG at 11 AM, 5 PM, 11 PM.  FBG has been 68-113.  Others have been 95-132.   Daughter Christine Bradshaw seemed very supportive, and said she will help her mom make some meal plans.  Christine Bradshaw said she had not seen any previous nutrition handouts, so I again included info on eating w/ diabetes for her.    24-hr recall:  (Up at 6 AM) B (6 AM)-  water Snk ( AM)-  12 oz Pepsi L (2 PM)-  1 pkg Ramen noodles, 12 oz Pepsi Snk ( PM)-  none D (8 PM)-  [place] sandwich w/ 1 tsp mayo on hamburger bun, 12 oz Pepsi Snk ( PM)-  none  Progress Towards Goal(s):  In progress.   Nutritional Diagnosis:  NB-2.1 Physical inactivity As related to back pain & depression.  As evidenced by no exercise at this time.    Intervention:  Nutrition education.  Monitoring/Evaluation:  Dietary intake, exercise, BG, and body weight in 4 week(s).

## 2012-01-16 NOTE — Patient Instructions (Addendum)
-   Based on yesterday's intake, you are getting: TOO LITTLE OF: VEGETABLES & FRUIT DAIRY (PROTEIN) TOO MUCH OF: PEPSI - Increase vegetables and fruit, as well as water; decrease soda.    - REMEMBER:  TASTE PREFERENCES ARE LEARNED.  We tend to like what we are used to.  We can LEARN to like healthier foods by more frequent exposure.    Diet Recommendations for Diabetes   Starchy (carb) foods include: Bread, rice, pasta, potatoes, corn, crackers, bagels, muffins, all baked goods.   Protein foods include: Meat, fish, poultry, eggs, dairy foods, and beans such as pinto and kidney beans (beans also provide carbohydrate).   1. Eat at least 3 meals and 1-2 snacks per day. Never go more than 4-5 hours while awake without eating.  2. Limit starchy foods to TWO per meal and ONE per snack. ONE portion of a starchy  food is equal to the following:   - ONE slice of bread (or its equivalent, such as half of a hamburger bun).   - 1/2 cup of a "scoopable" starchy food such as potatoes or rice.   - 1 OUNCE (28 grams) of starchy snack foods such as crackers or pretzels (look on label).   - 15 grams of carbohydrate as shown on food label.  3. Both lunch and dinner should include a protein food, a carb food, and vegetables.   - Obtain twice as many veg's as protein or carbohydrate foods for both lunch and dinner.   - Try to keep frozen veg's on hand for a quick vegetable serving.     - Fresh or frozen veg's are best.  4. Breakfast should always include protein.    - Work with Sue Lush to develop some meal plans for each week.   - Use the provided form to record your blood glucose daily, and bring to appts with Drs. [place] or [facility].

## 2012-02-13 ENCOUNTER — Ambulatory Visit: Payer: PRIVATE HEALTH INSURANCE | Admitting: Family Medicine

## 2012-02-14 ENCOUNTER — Other Ambulatory Visit: Payer: Self-pay | Admitting: Family Medicine

## 2012-02-14 DIAGNOSIS — Z1231 Encounter for screening mammogram for malignant neoplasm of breast: Secondary | ICD-10-CM

## 2012-02-16 ENCOUNTER — Ambulatory Visit: Payer: PRIVATE HEALTH INSURANCE | Admitting: Family Medicine

## 2012-02-20 ENCOUNTER — Ambulatory Visit (HOSPITAL_COMMUNITY)
Admission: RE | Admit: 2012-02-20 | Discharge: 2012-02-20 | Disposition: A | Discharge status: Home / Self Care | Payer: Self-pay | Source: Ambulatory Visit | Attending: Internal Medicine | Admitting: Internal Medicine

## 2012-02-20 DIAGNOSIS — Z1231 Encounter for screening mammogram for malignant neoplasm of breast: Secondary | ICD-10-CM

## 2012-03-15 ENCOUNTER — Encounter: Payer: Self-pay | Admitting: Family Medicine

## 2012-03-15 ENCOUNTER — Ambulatory Visit (INDEPENDENT_AMBULATORY_CARE_PROVIDER_SITE_OTHER): Payer: Self-pay | Admitting: Family Medicine

## 2012-03-15 VITALS — Ht 64.0 in | Wt 295.0 lb

## 2012-03-15 DIAGNOSIS — E669 Obesity, unspecified: Secondary | ICD-10-CM

## 2012-03-15 DIAGNOSIS — E119 Type 2 diabetes mellitus without complications: Secondary | ICD-10-CM

## 2012-03-15 DIAGNOSIS — Z794 Long term (current) use of insulin: Secondary | ICD-10-CM

## 2012-03-15 NOTE — Patient Instructions (Addendum)
-   Consider seeing our psychologist Dr. Pascal Lux [phone]) to learn some techniques for relaxation and stress management.  - GOAL:  Walk at least 3 X wk.      - Write down how many minutes you walk each time.   - GOAL:  Step in place for a couple minutes at least 10 times a day.  Go until you start feeling your back, then sit down.  Time yourself to see if you start being able to go longer.   - GOAL:  Vegetables twice a day.   Dr Gerilyn Pilgrim: [phone].

## 2012-03-15 NOTE — Progress Notes (Signed)
Medical Nutrition Therapy:  Appt start time: 1600 end time:  1700.  Assessment:  Primary concerns today: Blood sugar control.  Daughter Sue Lush brought Verba again today.  Steffanie said she has been eating breakfast regularly (Greek yogurt with GoLean), and she has been snacking in the afternoon, and she has been eating vegetables about 5 X wk.  Braelynn has been going to bed ~10 PM, up at 3 or 4 for ~2.5 hrs, then back to bed till ~9 AM.  She sleeps well when she sleeps, at least, and this new routine is a vast improvement over her previous erratic sleep schedule and sleeping in a chair much of the night.  She said she cannot sleep in the middle of the night b/c she feels anxious and depressed, as well as back pain.  She said she often gets walked over, then feels angry with herself for not standing up for herself.  Most of waking hours are spent "just sitting." Nikhita walked "a lot" while at her mom's for two weeks (which was all inside; her mom has a big house).    24-hr recall:  (Up at 8:15 AM) B (9:30 AM)-  6 oz [place] yogurt,  2/3 c GoLean Crunch, 16 oz water Snk ( AM)-  water L (12 PM)-  [employer] cheeseburger, ketchup, diet sweet tea Snk (3 PM)-  1/2 c trail mix, diet sweet tea D (7 PM)-  chx salad sandwich w/ let, handful chips, diet Coke Snk ( PM)-  none Atypical in that she has usually been eating veg's for dinner, and takeout foods are usually only 2 X wk.    Progress Towards Goal(s):  In progress.   Nutritional Diagnosis:  NB-2.1 Physical inactivity As related to back pain & depression.  As evidenced by no exercise at this time.    Intervention:  Nutrition education.  Monitoring/Evaluation:  Dietary intake, exercise, BG, and body weight in 4 week(s).

## 2012-04-10 ENCOUNTER — Ambulatory Visit: Payer: Self-pay | Admitting: Family Medicine

## 2012-04-16 ENCOUNTER — Ambulatory Visit: Payer: Self-pay | Admitting: Family Medicine

## 2012-04-17 ENCOUNTER — Other Ambulatory Visit: Payer: Self-pay | Admitting: Family Medicine

## 2012-04-17 MED ORDER — QUINAPRIL HCL 20 MG PO TABS: 20.0000 mg | ORAL_TABLET | Freq: Every day | ORAL | Status: DC

## 2012-04-17 NOTE — Telephone Encounter (Signed)
Needs appointment for more refills on Accupril (RF 1)

## 2012-05-07 ENCOUNTER — Ambulatory Visit (INDEPENDENT_AMBULATORY_CARE_PROVIDER_SITE_OTHER): Payer: Self-pay | Admitting: Family Medicine

## 2012-05-07 ENCOUNTER — Encounter: Payer: Self-pay | Admitting: Family Medicine

## 2012-05-07 VITALS — BP 129/85 | HR 108 | Temp 97.9°F | Ht 64.0 in | Wt 299.0 lb

## 2012-05-07 DIAGNOSIS — E119 Type 2 diabetes mellitus without complications: Secondary | ICD-10-CM

## 2012-05-07 DIAGNOSIS — Z794 Long term (current) use of insulin: Secondary | ICD-10-CM

## 2012-05-07 LAB — POCT GLYCOSYLATED HEMOGLOBIN (HGB A1C): Hemoglobin A1C: 5.9

## 2012-05-07 NOTE — Progress Notes (Signed)
  Subjective:    Patient ID: Christine Bradshaw, female    DOB: [birth year], 47 y.o.   MRN: [id number]  HPI # DM follow-up  Check BS 3 times a week  Fasting: usually 100-116   Lows? Occasionally. 15 times a month < 80. She felt "bad" when it was low and gets headaches.    Highs > 150. Twice this month based on previous meal.  After meals: usually 148-158  Required SSI 10 times in the past month  >150 10 units  Her meal times are irregular She may not eat for several hours at at time or sometimes eats throughout the day  Last A1c 5.9  Review of Systems Per HPI Numbness left foot chronic following back surgery   Allergies, medication, past medical history reviewed.  Smoking status noted. Anxiety, bipolar, depression  Chronic back pain  HTN, HLD IBS Tobacco     Objective:   Physical Exam GEN: NAD PSYCH: pleasant; not anxious or depressed appearing; mildly slow speech (about her baseline); normal though process and content CV: RRR, no m/r/g PULM: NI WOB; CTAB without w/r/r EXT: no edema     Assessment & Plan:  Eat more regularly. Every 3-4 hours and avoid big meals.   Continue metformin.  Stop Januvia.   Follow-up 2-4 weeks.    You may stop gabapentin but if your leg pain worsens, restart.

## 2012-05-07 NOTE — Patient Instructions (Addendum)
Eat more regularly. Every 3-4 hours and avoid big meals.   Continue metformin.  Stop Januvia.   Follow-up 2-4 weeks.    You may stop gabapentin but if your leg pain worsens, restart.

## 2012-05-09 ENCOUNTER — Other Ambulatory Visit: Payer: Self-pay | Admitting: *Deleted

## 2012-05-09 NOTE — Assessment & Plan Note (Addendum)
With several hypoglycemic episodes.  -Stop Januvia -Continue metformin  -Decrease Lantus from 70 to 65 units -Irregular eating pattern: advised to eat more regularly during day.  -Continue to check sugars tid  -Follow-up 2-4 weeks

## 2012-05-11 ENCOUNTER — Telehealth: Payer: Self-pay | Admitting: Family Medicine

## 2012-05-11 NOTE — Telephone Encounter (Signed)
Pharmacy requesting Januvia 100 mg.  I am not sure if she needs this.  Will route to PCP who will be back on Monday.

## 2012-05-12 NOTE — Telephone Encounter (Signed)
We stopped Januvia at recent office visit.

## 2012-05-29 ENCOUNTER — Ambulatory Visit (INDEPENDENT_AMBULATORY_CARE_PROVIDER_SITE_OTHER): Payer: No Typology Code available for payment source | Admitting: Family Medicine

## 2012-05-29 ENCOUNTER — Encounter: Payer: Self-pay | Admitting: Family Medicine

## 2012-05-29 VITALS — BP 135/90 | HR 96 | Temp 98.0°F | Ht 64.0 in | Wt 297.0 lb

## 2012-05-29 DIAGNOSIS — E119 Type 2 diabetes mellitus without complications: Secondary | ICD-10-CM

## 2012-05-29 DIAGNOSIS — E669 Obesity, unspecified: Secondary | ICD-10-CM

## 2012-05-29 DIAGNOSIS — Z794 Long term (current) use of insulin: Secondary | ICD-10-CM

## 2012-05-29 MED ORDER — METFORMIN HCL 1000 MG PO TABS: 1000.0000 mg | ORAL_TABLET | Freq: Two times a day (BID) | ORAL | Status: DC

## 2012-05-29 NOTE — Assessment & Plan Note (Signed)
Better controlled with only 1 sugar < 80 past couple of weeks.  Continue current regimen.  Novolog SS given.  Encouraged eat regularly throughout day (every 4 hours if possible) Follow-up in 2 months for diabetes

## 2012-05-29 NOTE — Patient Instructions (Addendum)
Good job on checking your sugars.   No changes to your Lantus or your metformin.  -Continue Lantus 65 units in the morning -Continue metformin twice a day -For Novolog:    150-170  7 units   170-190 10 units   190-210 13 units   Please make an appointment to discuss your weight on Thursday around 2:15 pm.

## 2012-05-29 NOTE — Assessment & Plan Note (Signed)
She asked about weight loss medications. We did not have time to discuss this in length but she will make appointment later this weeks to discuss.  Of note, her psychiatrist has reservations about her starting medications due to concern it may set off manic episode.

## 2012-05-29 NOTE — Progress Notes (Signed)
  Subjective:    Patient ID: Christine Bradshaw, female    DOB: 1965-04-24, 47 y.o.   MRN: [id number]  HPI # Diabetes follow-up Last seen 05/07. We had stopped Januvia and decreased Lantus at that time due to several hypoglycemic episodes. We had also discussed eating regular meals.   Home BS?   Yes, tid. 80-140s. Rare 63; a few highs 160-180s.  Hypoglycemic episodes? No She feels "bad" when her sugars are higher. She feels best when her sugars are 120s. She starts getting headaches when they are lower than that.   Compliant with medications? Yes   Metformin? She has been out for the past 3 days.      Denies nausea or other side effects with medication.    Lantus? 65 units AM.    Novolog prn. >150, 7 units. 170-180, 10 units. Never more than 10 units. Never less than 80 when she re-checks.   # Weight loss She sees psychiatrist Valinda Hoar who is concerned about obesity medications setting off her psychotic episodes.  She feels like she has tried a lot of things to lose weight.    Review of Systems Per HPI Denies fevers, chills, vomiting  Allergies, medication, past medical history reviewed.  Smoking status noted.     Objective:   Physical Exam GEN: NAD; obese  BS: 147     Assessment & Plan:

## 2012-05-31 ENCOUNTER — Ambulatory Visit: Payer: No Typology Code available for payment source | Admitting: Family Medicine

## 2012-06-01 ENCOUNTER — Other Ambulatory Visit: Payer: Self-pay | Admitting: Family Medicine

## 2012-06-01 MED ORDER — PRAVASTATIN SODIUM 80 MG PO TABS: 80.0000 mg | ORAL_TABLET | Freq: Every day | ORAL | Status: DC

## 2012-06-25 ENCOUNTER — Other Ambulatory Visit: Payer: Self-pay | Admitting: *Deleted

## 2012-06-25 NOTE — Telephone Encounter (Signed)
Also requesting refill on Januvia - not on MAR - please advise. Wyatt Haste, RN-BSN

## 2012-06-26 MED ORDER — QUINAPRIL HCL 20 MG PO TABS: 20.0000 mg | ORAL_TABLET | Freq: Every day | ORAL | Status: DC

## 2012-06-26 NOTE — Telephone Encounter (Signed)
Please notify patient we had discontinued Januvia last visit.  And would you please call in Rx for Accupril to [facility]? Thank you.

## 2012-06-26 NOTE — Telephone Encounter (Signed)
Called into [facility] refill for Accupril/ Discontinued Januvia with [facility]. Wyatt Haste, RN-BSN

## 2012-07-12 ENCOUNTER — Other Ambulatory Visit: Payer: Self-pay

## 2012-07-30 ENCOUNTER — Ambulatory Visit: Payer: No Typology Code available for payment source | Admitting: Family Medicine

## 2012-07-31 ENCOUNTER — Ambulatory Visit: Payer: No Typology Code available for payment source | Admitting: Family Medicine

## 2012-08-06 ENCOUNTER — Telehealth: Payer: Self-pay | Admitting: Family Medicine

## 2012-08-06 MED ORDER — QUINAPRIL HCL 20 MG PO TABS: 20.0000 mg | ORAL_TABLET | Freq: Every day | ORAL | Status: DC

## 2012-08-06 NOTE — Telephone Encounter (Signed)
Refilled quinapril. Sent to [facility].

## 2012-09-24 ENCOUNTER — Ambulatory Visit (INDEPENDENT_AMBULATORY_CARE_PROVIDER_SITE_OTHER): Payer: No Typology Code available for payment source | Admitting: Family Medicine

## 2012-09-24 ENCOUNTER — Encounter: Payer: Self-pay | Admitting: Family Medicine

## 2012-09-24 VITALS — BP 118/80 | Ht 64.0 in | Wt 302.0 lb

## 2012-09-24 DIAGNOSIS — M545 Low back pain, unspecified: Secondary | ICD-10-CM

## 2012-09-24 DIAGNOSIS — Z794 Long term (current) use of insulin: Secondary | ICD-10-CM

## 2012-09-24 DIAGNOSIS — E119 Type 2 diabetes mellitus without complications: Secondary | ICD-10-CM

## 2012-09-24 DIAGNOSIS — M549 Dorsalgia, unspecified: Secondary | ICD-10-CM

## 2012-09-24 DIAGNOSIS — I10 Essential (primary) hypertension: Secondary | ICD-10-CM

## 2012-09-24 MED ORDER — FENTANYL 50 MCG/HR TD PT72: 1.0000 | MEDICATED_PATCH | TRANSDERMAL | Status: DC

## 2012-09-24 NOTE — Patient Instructions (Addendum)
Back Pain-refilled for 1 more month (2 months total). Will try to get you into the pain clinic as well. We will not prescribe chronic narcotics.   Your blood pressure looks fine. Continue current medicine.   Thanks, Dr. Durene Cal  See me at my next available within a month to discuss diabetes and the cramps you are having.   Will discuss the following at next visit:  Health Maintenance Due  Topic Date Due  . Lipid Panel  03/06/2012  . Foot Exam  04/24/2012  . Influenza Vaccine  08/03/2012

## 2012-09-24 NOTE — Assessment & Plan Note (Addendum)
Orthopedic Dr. Alveda Reasons had been prescribing chronic narcotics (oxycodone and fentanyl patches) for 1.5 years and patient states he recently moved to [place] and can no longer fill medications. She requests change to [place] for pain management. Informed patient we will not be providing chronic pain medicine.Patient states only uses 3 vicodin per week (and bottle states 5-325) and relies on duragesic patches (fentanyl) which she is getting through [facility] through [employer].    Patient already using muscle relaxants, duragesic patch, vicodin, diclofenac, cymbalta so difficult for me to add much to her regimen. She is happy to go to pain clinic but worries about finances with orange card. We have referred her to the pain clinic at this time.   Reinforced need for weight loss with patient. Patient had been seeing Dr. Gerilyn Pilgrim. She knows she needs to lose weight to help with chronic back pain.

## 2012-09-24 NOTE — Assessment & Plan Note (Signed)
Well controlled. Continue current meds-quinapril. Patient was worried about hypotension bur reviewed her recordings and all >110 systolic and >60 diastolic and patient asymptomatic.

## 2012-09-24 NOTE — Progress Notes (Signed)
[facility] Tana Conch, MD Phone: [phone]  Subjective:  Chief complaint-noted  # Low Back Pain  Pain rating, quality, Location-from lumbar spine to just above buttocks crease. No recent increase in pain. Rated as severe. Aching pain.  Started, duration-s/p surgery in 2011 for longterm pain by Dr. 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 Patient on narcotics for at least last 1.5 years but appears had been on since surgery. Her orthopedist moved to [place] and she is hoping to transfer pain management to [facility].  Worse with laying on her back Relieved by-duragesic patch, diclofenac, cymalta. Patient states cannot walk ro get around without pain medicine.  Previous Treatment-surgery, narcotics  ROS-No saddle anesthesia, bladder incontinence, fecal incontinence, weakness in extremity, numbness or tingling in extremity.   #  Hypertension BP Readings from Last 3 Encounters:  09/24/12 118/80  05/29/12 135/90  05/07/12 129/85   Home BP monitoring-yes, typically SBP from 110-140 and diastolic 60-88.  Compliant with medications-yes without side effects, quinarpril. Lasix is a prn from previous MD for LE edema.  Denies any CP, HA, SOB, blurry vision, LE edema (has not used recently for edema), transient weakness, orthopnea, PND, lightheadedness   ROS--See HPI  Past Medical History Patient Active Problem List   Diagnosis Date Noted  . Tobacco abuse 04/25/2011    Priority: High  . Diabetes mellitus, type II, insulin dependent 04/17/2008    Priority: High  . Diabetic neuropathy, painful 03/10/2010    Priority: Medium  . BIPOLAR DISORDER UNSPECIFIED 06/05/2009    Priority: Medium  . BACK PAIN, CHRONIC 06/05/2009    Priority: Medium  . Obesity, morbid, BMI 50 or higher 04/21/2008    Priority: Medium  . HYPERTENSION 04/21/2008    Priority: Medium  . HYPERLIPIDEMIA 04/17/2008    Priority: Medium  . ANXIETY 04/21/2008    Priority: Low  . Skin lesion of  left leg 04/21/2011  . Edema of both legs 03/11/2010  . Irritable bowel syndrome 04/17/2008  . COLONIC POLYPS, HYPERPLASTIC, HX OF 04/17/2008   Reviewed problem list.  Medications- reviewed and updated Current Outpatient Prescriptions on File Prior to Visit  Medication Sig Dispense Refill  . Blood Glucose Monitoring Suppl (PRODIGY AUTOCODE BLOOD GLUCOSE) W/DEVICE KIT by Does not apply route.        . clonazePAM (KLONOPIN) 1 MG tablet Take 1 mg by mouth 3 (three) times daily as needed. Dr. Valinda Hoar prescribed (psychiatric NP)  30 tablet    . desloratadine (CLARINEX) 5 MG tablet Take 1 tablet (5 mg total) by mouth daily.  30 tablet  11  . DULoxetine (CYMBALTA) 60 MG capsule Take 120 mg by mouth daily.        Marland Kitchen esomeprazole (NEXIUM) 40 MG capsule Take 1 capsule (40 mg total) by mouth daily.  30 capsule  11  . furosemide (LASIX) 40 MG tablet Take 40 mg by mouth daily as needed.      . gabapentin (NEURONTIN) 600 MG tablet Take 600 mg by mouth 3 (three) times daily.      Marland Kitchen glucose blood (PRODIGY TEST) test strip 1 each by Other route as needed. Test 4 times a day       . HYDROcodone-acetaminophen (LORCET) 10-650 MG per tablet Take 1 tablet by mouth every 4 (four) hours as needed. per Dr. Alveda Reasons  30 tablet    . insulin aspart (NOVOLOG) 100 UNIT/ML injection Inject as directed  for sliding scale insulin Dispense 1 vial  10 mL  11  . insulin glargine (LANTUS) 100 UNIT/ML injection Inject 65 Units into the skin daily.      . INSULIN SYRINGE .5CC/29G (RELION INSULIN SYR 0.5CC/29G) 29G X 1/2" 0.5 ML MISC Use for insulin administration QID  100 each  11  . Insulin Syringe-Needle U-100 (INSULIN SYRINGE 1CC/28G) 28G X 1/2" 1 ML MISC 1 Device by Does not apply route 4 (four) times daily. Pt requires syringe for >50 units of insulin  200 each  11  . lamoTRIgine (LAMICTAL) 100 MG tablet Take 100 mg by mouth 2 (two) times daily.        . Lancets MISC by Does not apply route. Four times a day       .  metFORMIN (GLUCOPHAGE) 1000 MG tablet Take 1 tablet (1,000 mg total) by mouth 2 (two) times daily with a meal.  180 tablet  4  . methocarbamol (ROBAXIN) 500 MG tablet Take 500 mg by mouth every 6 (six) hours as needed. For spasms per Dr. Alveda Reasons       . omega-3 acid ethyl esters (LOVAZA) 1 G capsule Take 2 capsules (2 g total) by mouth 2 (two) times daily.  360 capsule  3  . Omega-3 Fatty Acids (FISH OIL) 300 MG CAPS Take 1 capsule by mouth daily.        . pravastatin (PRAVACHOL) 80 MG tablet Take 1 tablet (80 mg total) by mouth every evening.  30 tablet  11  . pravastatin (PRAVACHOL) 80 MG tablet Take 1 tablet (80 mg total) by mouth daily.  30 tablet  6  . quinapril (ACCUPRIL) 20 MG tablet Take 1 tablet (20 mg total) by mouth at bedtime.  30 tablet  11  . quinapril (ACCUPRIL) 20 MG tablet Take 1 tablet (20 mg total) by mouth at bedtime.  30 tablet  11  . risperiDONE (RISPERDAL) 0.5 MG tablet Take 2 mg by mouth at bedtime.       . traZODone (DESYREL) 100 MG tablet Take 100 mg by mouth at bedtime.         No current facility-administered medications on file prior to visit.   Objective: BP 118/80  Ht 5\' 4"  (1.626 m)  Wt 302 lb (136.986 kg)  BMI 51.81 kg/m2 Gen: NAD, resting comfortably on chair, moves slowly from chair to table due to pain CV: RRR no murmurs rubs or gallops Lungs: CTAB no crackles, wheeze, rhonchi Skin: warm, dry Neuro: grossly normal, moves all extremities, no LE weakness, no saddle anesthesia and distal sensation intact Ext: trace edema  Back - Normal skin, Spine with normal alignment and no deformity. From approximately L3-S1 patient with tenderness even to light touch of low back including midline (not new per patient). Paraspinous muscles tender but no obvious spasm. Patient with decreased back extension but able to flext to touch toes.   Range of motion is full at neck. Negative straight leg raise.   Assessment/Plan:  >50% of a 30 minute office visit spent on counseling  (weight loss and strategies, little long term benefit from narcotics, etc)  Patient to return at my next available to discuss DM (a1c 5.5 today) and leg cramps.

## 2012-10-03 ENCOUNTER — Encounter: Payer: Self-pay | Admitting: Physical Medicine & Rehabilitation

## 2012-10-03 ENCOUNTER — Telehealth: Payer: Self-pay | Admitting: Family Medicine

## 2012-10-03 DIAGNOSIS — K089 Disorder of teeth and supporting structures, unspecified: Secondary | ICD-10-CM

## 2012-10-03 DIAGNOSIS — M549 Dorsalgia, unspecified: Secondary | ICD-10-CM

## 2012-10-03 NOTE — Telephone Encounter (Signed)
Patient calls needing a referral to the Dental Clinic.

## 2012-10-03 NOTE — Telephone Encounter (Signed)
Will forward to MD. Devorah Givhan,CMA  

## 2012-10-03 NOTE — Telephone Encounter (Signed)
Blue team-need more information.  What does she need referral for? What resources are available for orange card? I don't know of any.

## 2012-10-03 NOTE — Telephone Encounter (Signed)
Pt states that she needs to be seen for a broken tooth.  Also she has a few that are rotting around the gum line. This is causing her pain in her mouth.  Pt also would like to know if she can get a refill on her Duragesic patch, diclofenac and norco.  Pt is scheduled for pain management 06-03-2013, this was their first available for orange card.  Please advise.  Jazmin Hartsell,CMA

## 2012-10-04 MED ORDER — DICLOFENAC SODIUM 75 MG PO TBEC: 75.0000 mg | DELAYED_RELEASE_TABLET | Freq: Two times a day (BID) | ORAL | Status: DC | PRN

## 2012-10-04 NOTE — Telephone Encounter (Signed)
Pt informed. Fleeger, Jessica Dawn  

## 2012-10-04 NOTE — Telephone Encounter (Addendum)
Blue team, please inform patients of the following:  Dental referral placed. Thank you blue team on updating me on resources for orange card.   In regards to pain medicine, I refilled diclofenac. We no longer are initiating patients on chronic narcotics. Patient was given 30 days of duragesic patches at last visit and referred to pain clinic. Unfortunately, cannot be seen there until next year using orange card.  Patient has the following options for pain: 1. Pay out of pocket for services at pain clinic 2. Contact [facility] and ask for a different prescriber to continue her pain medications as our records through 2012 indicate that we have not been the prescriber of this medicine.

## 2012-10-23 ENCOUNTER — Other Ambulatory Visit: Payer: Self-pay | Admitting: Family Medicine

## 2012-10-23 MED ORDER — DESLORATADINE 5 MG PO TABS: 5.0000 mg | ORAL_TABLET | Freq: Every day | ORAL | Status: DC

## 2012-10-24 ENCOUNTER — Telehealth: Payer: Self-pay | Admitting: Family Medicine

## 2012-10-24 MED ORDER — DESLORATADINE 5 MG PO TABS: 5.0000 mg | ORAL_TABLET | Freq: Every day | ORAL | Status: DC

## 2012-10-24 NOTE — Telephone Encounter (Signed)
[facility]. Dathan Attia, Maryjo [place]

## 2012-10-24 NOTE — Telephone Encounter (Signed)
Ms. Hosmer rx for her Clarinex went to the incorrect pharmacy.  Should have [facility]. Cancelled refill with [facility].  Want it reissued to [facility].  Call patient back if there is a problem.

## 2012-10-30 ENCOUNTER — Other Ambulatory Visit: Payer: Self-pay | Admitting: Family Medicine

## 2012-10-30 MED ORDER — OMEGA-3-ACID ETHYL ESTERS 1 G PO CAPS: 2.0000 g | ORAL_CAPSULE | Freq: Two times a day (BID) | ORAL | Status: DC

## 2012-11-08 ENCOUNTER — Encounter: Payer: Self-pay | Admitting: Family Medicine

## 2012-11-08 ENCOUNTER — Other Ambulatory Visit: Payer: Self-pay

## 2012-11-08 ENCOUNTER — Ambulatory Visit (INDEPENDENT_AMBULATORY_CARE_PROVIDER_SITE_OTHER): Payer: No Typology Code available for payment source | Admitting: Family Medicine

## 2012-11-08 VITALS — BP 141/89 | HR 102 | Temp 98.1°F | Ht 64.0 in | Wt 298.0 lb

## 2012-11-08 DIAGNOSIS — E119 Type 2 diabetes mellitus without complications: Secondary | ICD-10-CM

## 2012-11-08 DIAGNOSIS — Z794 Long term (current) use of insulin: Secondary | ICD-10-CM

## 2012-11-08 DIAGNOSIS — Z79899 Other long term (current) drug therapy: Secondary | ICD-10-CM

## 2012-11-08 DIAGNOSIS — R5381 Other malaise: Secondary | ICD-10-CM

## 2012-11-08 DIAGNOSIS — Z23 Encounter for immunization: Secondary | ICD-10-CM

## 2012-11-08 DIAGNOSIS — R609 Edema, unspecified: Secondary | ICD-10-CM

## 2012-11-08 DIAGNOSIS — F17200 Nicotine dependence, unspecified, uncomplicated: Secondary | ICD-10-CM

## 2012-11-08 DIAGNOSIS — R600 Localized edema: Secondary | ICD-10-CM

## 2012-11-08 DIAGNOSIS — I10 Essential (primary) hypertension: Secondary | ICD-10-CM

## 2012-11-08 DIAGNOSIS — E785 Hyperlipidemia, unspecified: Secondary | ICD-10-CM

## 2012-11-08 DIAGNOSIS — Z720 Tobacco use: Secondary | ICD-10-CM

## 2012-11-08 LAB — COMPREHENSIVE METABOLIC PANEL
AST: 30 U/L (ref 0–37)
Albumin: 4.2 g/dL (ref 3.5–5.2)
Alkaline Phosphatase: 68 U/L (ref 39–117)
BUN: 12 mg/dL (ref 6–23)
Chloride: 102 mEq/L (ref 96–112)
Creat: 0.69 mg/dL (ref 0.50–1.10)
Glucose, Bld: 105 mg/dL — ABNORMAL HIGH (ref 70–99)
Potassium: 4.9 mEq/L (ref 3.5–5.3)
Total Bilirubin: 0.4 mg/dL (ref 0.3–1.2)
Total Protein: 7 g/dL (ref 6.0–8.3)

## 2012-11-08 LAB — CBC
MCHC: 34 g/dL (ref 30.0–36.0)
MCV: 86.1 fL (ref 78.0–100.0)
Platelets: 364 10*3/uL (ref 150–400)
RDW: 15.2 % (ref 11.5–15.5)
WBC: 10.9 10*3/uL — ABNORMAL HIGH (ref 4.0–10.5)

## 2012-11-08 LAB — LIPID PANEL
Cholesterol: 116 mg/dL (ref 0–200)
Total CHOL/HDL Ratio: 3.6 Ratio
Triglycerides: 231 mg/dL — ABNORMAL HIGH (ref <150)
VLDL: 46 mg/dL — ABNORMAL HIGH (ref 0–40)

## 2012-11-08 LAB — TSH: TSH: 1.17 u[IU]/mL (ref 0.350–4.500)

## 2012-11-08 NOTE — Patient Instructions (Signed)
For your labs, I will send you a letter if there are no medication changes needed. I will call you if we need to discuss your lab results.   Diabetes looks PHENOMENAL! Keep up the great work. Because you are doing so well, victoza is not a good option for you.   I would ask you to add an aspirin 81mg  daily to your routine.   See me every 3 months at least,  Dr. Paulette Blanch. Use those compression stockings and elevate your legs if you are having trouble with swelling.   Did all these today: Health Maintenance Due  Topic Date Due  . Lipid Panel  03/06/2012  . Foot Exam  04/24/2012  . Influenza Vaccine  08/03/2012

## 2012-11-09 ENCOUNTER — Encounter: Payer: Self-pay | Admitting: Family Medicine

## 2012-11-09 NOTE — Assessment & Plan Note (Signed)
Patient with a lot of life stressors (1st child in college, financial struggles, husband on disability, 49 year old daughter having trouble with her job). States smoking is her one relief. She is resistant to quitting at present. Encouraged cessation.

## 2012-11-09 NOTE — Assessment & Plan Note (Signed)
Patient has moved up her appointment with pain management to this month and she plans to pay out of pocket.

## 2012-11-09 NOTE — Assessment & Plan Note (Signed)
Isolated systolic hypertension. WIll monitor for now and not increase medications as previously well controlled.

## 2012-11-09 NOTE — Assessment & Plan Note (Signed)
Well controlled on repeat lipids today. Continue pravastatin.

## 2012-11-09 NOTE — Progress Notes (Signed)
[facility] Tana Conch, MD Phone: [phone]  Subjective:  Chief complaint-noted  # Long term follow up for DIABETES Type II/hyperlipidemia/tobacco abuse/hypertension Medications taking and tolerating-yes.  DM-Insulin, metformin HLD-pravastain HTN-quinapril, prn lasix Blood Sugars per patient-fasting- 90-120, before lunch 90-120, before dinner about 120 and at bedtime 130 Diet-does her best to eat healthy but tends to overeat.  Regular Exercise-no, due to back pain Smoking-not interested in quitting. 1.5 PPD.   Health Maintenance Due  Topic Date Due  . Foot Exam -today 04/24/2012   On Aspirin-advised patient to start this On statin-yes Daily foot monitoring-yes  ROS- Denies Polyuria,Polydipsia, nocturia, Vision changesDenies  Hypoglycemia symptoms (shaky, sweaty, hungry, weak anxious, tremor, palpitations, confusion, behavior change). No chest pain or shortness of breath  Hemoglobin a1c:  Lab Results  Component Value Date   HGBA1C 5.5 09/24/2012   HGBA1C 5.9 05/07/2012   HGBA1C 5.9 12/14/2011   Past Medical History Patient Active Problem List   Diagnosis Date Noted  . Tobacco abuse 04/25/2011    Priority: High  . Diabetes mellitus, type II, insulin dependent 04/17/2008    Priority: High  . Diabetic neuropathy, painful 03/10/2010    Priority: Medium  . BIPOLAR DISORDER UNSPECIFIED 06/05/2009    Priority: Medium  . BACK PAIN, CHRONIC 06/05/2009    Priority: Medium  . Obesity, morbid, BMI 50 or higher 04/21/2008    Priority: Medium  . HYPERTENSION 04/21/2008    Priority: Medium  . HYPERLIPIDEMIA 04/17/2008    Priority: Medium  . ANXIETY 04/21/2008    Priority: Low  . Skin lesion of left leg 04/21/2011  . Edema of both legs 03/11/2010  . Irritable bowel syndrome 04/17/2008  . COLONIC POLYPS, HYPERPLASTIC, HX OF 04/17/2008    Medications- reviewed and updated Current Outpatient Prescriptions on File Prior to Visit  Medication Sig  Dispense Refill  . Blood Glucose Monitoring Suppl (PRODIGY AUTOCODE BLOOD GLUCOSE) W/DEVICE KIT by Does not apply route.        . clonazePAM (KLONOPIN) 1 MG tablet Take 1 mg by mouth 3 (three) times daily as needed. Dr. Valinda Hoar prescribed (psychiatric NP)  30 tablet    . desloratadine (CLARINEX) 5 MG tablet Take 1 tablet (5 mg total) by mouth daily.  90 tablet  3  . diclofenac (VOLTAREN) 75 MG EC tablet Take 1 tablet (75 mg total) by mouth 2 (two) times daily as needed.  30 tablet  1  . DULoxetine (CYMBALTA) 60 MG capsule Take 120 mg by mouth daily.        Marland Kitchen esomeprazole (NEXIUM) 40 MG capsule Take 1 capsule (40 mg total) by mouth daily.  30 capsule  11  . fentaNYL (DURAGESIC) 50 MCG/HR Place 1 patch (50 mcg total) onto the skin every 3 (three) days.  10 patch  0  . gabapentin (NEURONTIN) 600 MG tablet Take 600 mg by mouth 3 (three) times daily.      Marland Kitchen glucose blood (PRODIGY TEST) test strip 1 each by Other route as needed. Test 4 times a day       . HYDROcodone-acetaminophen (LORCET) 10-650 MG per tablet Take 1 tablet by mouth every 4 (four) hours as needed. per Dr. Alveda Reasons  30 tablet    . insulin aspart (NOVOLOG) 100 UNIT/ML injection Inject as directed  for sliding scale insulin Dispense 1 vial  10 mL  11  . insulin glargine (LANTUS) 100 UNIT/ML injection Inject 70 Units into the skin daily.       Marland Kitchen  INSULIN SYRINGE .5CC/29G (RELION INSULIN SYR 0.5CC/29G) 29G X 1/2" 0.5 ML MISC Use for insulin administration QID  100 each  11  . Insulin Syringe-Needle U-100 (INSULIN SYRINGE 1CC/28G) 28G X 1/2" 1 ML MISC 1 Device by Does not apply route 4 (four) times daily. Pt requires syringe for >50 units of insulin  200 each  11  . lamoTRIgine (LAMICTAL) 100 MG tablet Take 100 mg by mouth 2 (two) times daily.        . Lancets MISC by Does not apply route. Four times a day       . metFORMIN (GLUCOPHAGE) 1000 MG tablet Take 1 tablet (1,000 mg total) by mouth 2 (two) times daily with a meal.  180 tablet  4   . methocarbamol (ROBAXIN) 500 MG tablet Take 500 mg by mouth every 6 (six) hours as needed. For spasms per Dr. Alveda Reasons       . omega-3 acid ethyl esters (LOVAZA) 1 G capsule Take 2 capsules (2 g total) by mouth 2 (two) times daily.  360 capsule  3  . Omega-3 Fatty Acids (FISH OIL) 300 MG CAPS Take 1 capsule by mouth daily.        . pravastatin (PRAVACHOL) 80 MG tablet Take 1 tablet (80 mg total) by mouth daily.  30 tablet  6  . quinapril (ACCUPRIL) 20 MG tablet Take 1 tablet (20 mg total) by mouth at bedtime.  30 tablet  11  . risperiDONE (RISPERDAL) 0.5 MG tablet Take 2 mg by mouth at bedtime.       . traZODone (DESYREL) 100 MG tablet Take 100 mg by mouth at bedtime.         No current facility-administered medications on file prior to visit.    Objective: BP 141/89  Pulse 102  Temp(Src) 98.1 F (36.7 C) (Oral)  Ht 5\' 4"  (1.626 m)  Wt 298 lb (135.172 kg)  BMI 51.13 kg/m2 Gen: NAD, resting comfortably, morbidly obese CV: RRR no murmurs rubs or gallops Lungs: CTAB no crackles, wheeze, rhonchi Ext: 1+ bilateral edema DM foot exam: intact pulses, decreased sensation to monofilament on bilateral soles and on left leg up to just above ankle. No deformities.  Assessment/Plan:

## 2012-11-09 NOTE — Assessment & Plan Note (Addendum)
Well controlled with a1c of 5.5. Continue lantus 70 units daily and novolog per sliding scale as well as metformin. Patient was interested in victoza for weight loss but told patient would be risky for hypoglycemia and given she is so well controlled owuld not want to add another medication.

## 2012-11-09 NOTE — Assessment & Plan Note (Addendum)
Previous workup unrevealing (ABI normal 8/13, CBC, CMET). This is due to venous insufficiency and Have advised patient to elevate legs and use compression stockings and lose weight as opposed to prn lasix.

## 2012-11-12 ENCOUNTER — Ambulatory Visit: Payer: No Typology Code available for payment source

## 2012-11-16 ENCOUNTER — Telehealth: Payer: Self-pay | Admitting: Family Medicine

## 2012-11-16 ENCOUNTER — Ambulatory Visit (INDEPENDENT_AMBULATORY_CARE_PROVIDER_SITE_OTHER): Payer: No Typology Code available for payment source | Admitting: Family Medicine

## 2012-11-16 ENCOUNTER — Encounter: Payer: Self-pay | Admitting: Family Medicine

## 2012-11-16 VITALS — BP 134/59 | HR 110 | Temp 98.8°F | Ht 64.5 in | Wt 297.8 lb

## 2012-11-16 DIAGNOSIS — K047 Periapical abscess without sinus: Secondary | ICD-10-CM | POA: Insufficient documentation

## 2012-11-16 MED ORDER — PENICILLIN V POTASSIUM 500 MG PO TABS: 500.0000 mg | ORAL_TABLET | Freq: Three times a day (TID) | ORAL | Status: DC

## 2012-11-16 MED ORDER — HYDROCODONE-ACETAMINOPHEN 5-325 MG PO TABS: 1.0000 | ORAL_TABLET | Freq: Four times a day (QID) | ORAL | Status: DC | PRN

## 2012-11-16 MED ORDER — MORPHINE SULFATE 10 MG/ML IJ SOLN
2.0000 mg | Freq: Once | INTRAMUSCULAR | Status: AC
  Administered 2012-11-16: 2 mg via INTRAMUSCULAR

## 2012-11-16 NOTE — Assessment & Plan Note (Signed)
Large 2.5 x 2 cm indurated area in roof of mouth behind left lateral incisor (which is broken off). Attempted I+D today with foul drainage noted initially but large area of induration persisted and did not feel that drainage was adequate. PCN vk x 7 days. Vicodin #30. Encouraged follow up with dentist in 7-10 days. Encouraged patient to follow up with me in a week. Advised going to hsospital if pain worsened or febrile.

## 2012-11-16 NOTE — Telephone Encounter (Signed)
Pt called and needs the doctor to send her antibiotics for her tooth. It is now infected and can not get an appointment until 12/16 at the dentist. JW

## 2012-11-16 NOTE — Progress Notes (Signed)
Christine Conch, MD Phone: [phone]  Subjective:  Chief complaint-noted  # Dental Abscess First ntoed 3 days ago. Slowly growing in size. Behind left lateral incisor. Patient with pain radiating behind ear and down neck. Has broken off tooth at base of this location which was originally sore before abscess started growing. Taking advil for pain relief. Does not remember particular inciting event. Pain constant. Has been eating less but able to take down fluids.  ROS-Denies nausea/vomiting/fever/chills/fatigue/overall sick feelings.    Past Medical History Patient Active Problem List   Diagnosis Date Noted  . Tobacco abuse 04/25/2011    Priority: High  . Diabetes mellitus, type II, insulin dependent 04/17/2008    Priority: High  . Diabetic neuropathy, painful 03/10/2010    Priority: Medium  . BIPOLAR DISORDER UNSPECIFIED 06/05/2009    Priority: Medium  . BACK PAIN, CHRONIC 06/05/2009    Priority: Medium  . Obesity, morbid, BMI 50 or higher 04/21/2008    Priority: Medium  . HYPERTENSION 04/21/2008    Priority: Medium  . HYPERLIPIDEMIA 04/17/2008    Priority: Medium  . Irritable bowel syndrome 04/17/2008    Priority: Medium  . Skin lesion of left leg 04/21/2011    Priority: Low  . Venous insufficiency 03/11/2010    Priority: Low  . ANXIETY 04/21/2008    Priority: Low  . COLONIC POLYPS, HYPERPLASTIC, HX OF 04/17/2008    Priority: Low  . Dental abscess 11/16/2012    Medications- reviewed and updated Current Outpatient Prescriptions on File Prior to Visit  Medication Sig Dispense Refill  . aspirin 81 MG tablet Take 81 mg by mouth daily.      . Blood Glucose Monitoring Suppl (PRODIGY AUTOCODE BLOOD GLUCOSE) W/DEVICE KIT by Does not apply route.        . clonazePAM (KLONOPIN) 1 MG tablet Take 1 mg by mouth 3 (three) times daily as needed. Dr. Valinda Hoar prescribed (psychiatric NP)  30 tablet    . desloratadine (CLARINEX) 5 MG tablet Take 1 tablet (5 mg total) by  mouth daily.  90 tablet  3  . diclofenac (VOLTAREN) 75 MG EC tablet Take 1 tablet (75 mg total) by mouth 2 (two) times daily as needed.  30 tablet  1  . DULoxetine (CYMBALTA) 60 MG capsule Take 120 mg by mouth daily.        Marland Kitchen esomeprazole (NEXIUM) 40 MG capsule Take 1 capsule (40 mg total) by mouth daily.  30 capsule  11  . fentaNYL (DURAGESIC) 50 MCG/HR Place 1 patch (50 mcg total) onto the skin every 3 (three) days.  10 patch  0  . gabapentin (NEURONTIN) 600 MG tablet Take 600 mg by mouth 3 (three) times daily.      Marland Kitchen glucose blood (PRODIGY TEST) test strip 1 each by Other route as needed. Test 4 times a day       . insulin aspart (NOVOLOG) 100 UNIT/ML injection Inject as directed  for sliding scale insulin Dispense 1 vial  10 mL  11  . insulin glargine (LANTUS) 100 UNIT/ML injection Inject 70 Units into the skin daily.       . INSULIN SYRINGE .5CC/29G (RELION INSULIN SYR 0.5CC/29G) 29G X 1/2" 0.5 ML MISC Use for insulin administration QID  100 each  11  . Insulin Syringe-Needle U-100 (INSULIN SYRINGE 1CC/28G) 28G X 1/2" 1 ML MISC 1 Device by Does not apply route 4 (four) times daily. Pt requires syringe for >50 units of insulin  200 each  11  .  lamoTRIgine (LAMICTAL) 100 MG tablet Take 100 mg by mouth 2 (two) times daily.        . Lancets MISC by Does not apply route. Four times a day       . metFORMIN (GLUCOPHAGE) 1000 MG tablet Take 1 tablet (1,000 mg total) by mouth 2 (two) times daily with a meal.  180 tablet  4  . methocarbamol (ROBAXIN) 500 MG tablet Take 500 mg by mouth every 6 (six) hours as needed. For spasms per Dr. Alveda Reasons       . omega-3 acid ethyl esters (LOVAZA) 1 G capsule Take 2 capsules (2 g total) by mouth 2 (two) times daily.  360 capsule  3  . Omega-3 Fatty Acids (FISH OIL) 300 MG CAPS Take 1 capsule by mouth daily.        . pravastatin (PRAVACHOL) 80 MG tablet Take 1 tablet (80 mg total) by mouth daily.  30 tablet  6  . quinapril (ACCUPRIL) 20 MG tablet Take 1 tablet (20 mg  total) by mouth at bedtime.  30 tablet  11  . risperiDONE (RISPERDAL) 0.5 MG tablet Take 2 mg by mouth at bedtime.       . traZODone (DESYREL) 100 MG tablet Take 100 mg by mouth at bedtime.         No current facility-administered medications on file prior to visit.    Objective: BP 134/59  Pulse 110  Temp(Src) 98.8 F (37.1 C) (Oral)  Ht 5' 4.5" (1.638 m)  Wt 297 lb 12.8 oz (135.081 kg)  BMI 50.35 kg/m2 Gen: appears slightly uncomfortable at rest, in pain with dental exam Mouth: left lateral incisor broken off, inflammed gums around this 2.5 x 2cm dental abscess noted behind this.  CV: RRR no murmurs rubs or gallops Lungs: CTAB no crackles, wheeze, rhonchi  Procedure:  Incision and drainage of dental abscess Risks, benefits, and alternatives explained and consent obtained. Viscous lidocaine applied Adequate anesthesia ensured. #11 blade used to make a stab incision into abscess. Pus expressed with pressure. No packing placed Hemostasis achieved. Pt stable. Aftercare and follow-up advised.  Assessment/Plan:

## 2012-11-16 NOTE — Telephone Encounter (Signed)
Pt called back and spoke with Tia Hill.  Per MD pt will need appt. Fleeger, Maryjo [place]

## 2012-11-16 NOTE — Patient Instructions (Signed)
You have an abscess on the roof of your mouth from an infected tooth. We drained it today, try not to swallow the drainage.   You can take the pain medicine as needed. Please take the antibiotic for 7 days and see your dentist in 7-10 days. You need to have this tooth removed before it recurs  Thanks, Dr. Durene Cal

## 2012-11-19 ENCOUNTER — Telehealth: Payer: Self-pay | Admitting: Family Medicine

## 2012-11-19 ENCOUNTER — Encounter: Payer: No Typology Code available for payment source | Attending: Physical Medicine & Rehabilitation

## 2012-11-19 ENCOUNTER — Ambulatory Visit (HOSPITAL_BASED_OUTPATIENT_CLINIC_OR_DEPARTMENT_OTHER): Payer: No Typology Code available for payment source | Admitting: Physical Medicine & Rehabilitation

## 2012-11-19 ENCOUNTER — Encounter: Payer: Self-pay | Admitting: Physical Medicine & Rehabilitation

## 2012-11-19 VITALS — BP 122/76 | HR 117 | Resp 16 | Ht 64.5 in | Wt 298.0 lb

## 2012-11-19 DIAGNOSIS — G8928 Other chronic postprocedural pain: Secondary | ICD-10-CM | POA: Insufficient documentation

## 2012-11-19 DIAGNOSIS — M545 Low back pain, unspecified: Secondary | ICD-10-CM

## 2012-11-19 DIAGNOSIS — IMO0002 Reserved for concepts with insufficient information to code with codable children: Secondary | ICD-10-CM | POA: Insufficient documentation

## 2012-11-19 DIAGNOSIS — M549 Dorsalgia, unspecified: Secondary | ICD-10-CM

## 2012-11-19 DIAGNOSIS — Z5181 Encounter for therapeutic drug level monitoring: Secondary | ICD-10-CM

## 2012-11-19 DIAGNOSIS — Z79899 Other long term (current) drug therapy: Secondary | ICD-10-CM

## 2012-11-19 DIAGNOSIS — M961 Postlaminectomy syndrome, not elsewhere classified: Secondary | ICD-10-CM | POA: Insufficient documentation

## 2012-11-19 NOTE — Patient Instructions (Addendum)
Next visit with me  will be for injection Please get a 1 month supply of your fentanyl patch from your family doctor If your urine screen looks okay we can prescribed medications

## 2012-11-19 NOTE — Progress Notes (Signed)
Subjective:    Patient ID: GEORJEAN TOYA, female    DOB: 01-05-1965, 47 y.o.   MRN: [id number]  HPI Complaint is chronic low back pain Secondary complaint bilateral hip pain as well as left shooting pain down to the toes.  Preoperatively patient had pain into the right lower extremity. Postoperatively patient had pain into the left lower extremity. She states that she can touch in area on her low back and cause some pins and needles sensation.  Symptoms have been progressive over time.  Past medical history significant for diabetic neuropathy however she states that the pain is limited to the left foot.  Bipolar disorder has been controlled with medications. Still follows with psychiatry. Morbid obesity weight has been stable.  Was started on a fentanyl patch by orthopedic surgery approximately 18 months ago. Takes hydrocodone once or twice per day. Normally she takes his only every few days or every few weeks. Tried gabapentin but was not effective Takes Cymbalta prescribed by psychiatry.  No history of spine injections. Tried physical therapy, patient states that it makes things worse Review of Systems  Constitutional: Positive for unexpected weight change.  Gastrointestinal: Positive for constipation.  Musculoskeletal: Positive for back pain and gait problem.  Neurological: Positive for numbness.  Psychiatric/Behavioral: Positive for dysphoric mood. The patient is nervous/anxious.   All other systems reviewed and are negative.       Objective:   Physical Exam  Nursing note and vitals reviewed. Constitutional: She is oriented to person, place, and time. She appears well-developed.  Morbid obesity  HENT:  Head: Normocephalic and atraumatic.  Right Ear: External ear normal.  Left Ear: External ear normal.  Nose: Nose normal.  Mouth/Throat: Oropharynx is clear and moist.  Eyes: Conjunctivae and EOM are normal. Pupils are equal, round, and reactive to light.  Neck: Normal  range of motion. Neck supple.  Cardiovascular: Regular rhythm and intact distal pulses.  Tachycardia present.   Pulmonary/Chest: Effort normal and breath sounds normal.  Musculoskeletal:       Lumbar back: She exhibits decreased range of motion, tenderness and deformity. She exhibits no edema and no spasm.  Normal lumbar flexion Reduced lumbar extension Reduced lumbar lateral bending Tenderness to palpation bilateral lumbar paraspinals L4-S1 Decreased motion in the lower lumbar area  Neurological: She is alert and oriented to person, place, and time. A sensory deficit is present. No cranial nerve deficit. She exhibits normal muscle tone.  Reflex Scores:      Tricep reflexes are 2+ on the right side and 2+ on the left side.      Bicep reflexes are 2+ on the right side and 2+ on the left side.      Brachioradialis reflexes are 2+ on the right side and 2+ on the left side.      Patellar reflexes are 2+ on the right side and 2+ on the left side.      Achilles reflexes are 1+ on the right side and 1+ on the left side. Decreased sensation left L5 and left S1 dermatomes 4 minus/5 left ankle dorsiflexion otherwise 5/5 in bilateral upper and lower extremities     Pain Inventory Average Pain 8 Pain Right Now 8 My pain is sharp, burning, dull, stabbing, tingling and aching  In the last 24 hours, has pain interfered with the following? General activity 8 Relation with others 7 Enjoyment of life 9 What TIME of day is your pain at its worst? daytime Sleep (in general) Fair  Pain  is worse with: walking, sitting, standing and some activites Pain improves with: rest, heat/ice and medication Relief from Meds: 5  Mobility walk without assistance walk with assistance use a walker how many minutes can you walk? 5 ability to climb steps?  yes do you drive?  no use a wheelchair Do you have any goals in this area?  yes  Function not employed: date last employed 1999 I need assistance with  the following:  dressing, bathing, meal prep, household duties and shopping  Neuro/Psych numbness tingling trouble walking spasms depression anxiety  Prior Studies x-rays CT/MRI  Physicians involved in your care Orthopedist Nelda Severe   History reviewed. No pertinent family history. History   Social History  . Marital Status: Married    Spouse Name: N/A    Number of Children: N/A  . Years of Education: N/A   Occupational History  . disability    Social History Main Topics  . Smoking status: Current Every Day Smoker -- 1.50 packs/day    Types: Cigarettes  . Smokeless tobacco: None  . Alcohol Use: No  . Drug Use: No  . Sexual Activity: Yes    Partners: Male   Other Topics Concern  . None   Social History Narrative   Husband on disability   Pt also on disability   Past Surgical History  Procedure Laterality Date  . Tonsillectomy    . Cholecystectomy    . Appendectomy    . Esophagogastroduodenoscopy  2010    showed erosions  . Colonoscopy w/ biopsies  2010    adenomatous polyp repeat 2015  . Lumbar fusion  2011    L4-L5-S1 Dr. Alveda Reasons   Past Medical History  Diagnosis Date  . Hypertension   . Depression   . Irritable bowel syndrome   . Diabetes mellitus type II   . Panic attack as reaction to stress   . Insomnia   . Tremor   . Memory loss of unknown cause   . Back pain, chronic   . HX OF GALLSTONE 04/21/2008    Qualifier: Diagnosis of  By: Misty Stanley CMA (AAMA), Marchelle Folks    . HEADACHE, CHRONIC 04/21/2008   BP 122/76  Pulse 117  Resp 16  Ht 5' 4.5" (1.638 m)  Wt 298 lb (135.172 kg)  BMI 50.38 kg/m2  SpO2 94%        Assessment & Plan:  1. Lumbar postlaminectomy syndrome with chronic postoperative pain as well as left lower extremity chronic radiculitis L5-S1. Her axial back pain appears to be related to facet arthrosis Would recommend trial of T12, L1,L2 MBB and if >50% relief on 2 occasions, would be candidate for radiofrequency  ablation  May benefit from trial of Lyrica, however with her polypharmacy related to bipolar d/o there would be increased chance of interaction.  In regards to opiates, has been on long term with partial relief ORT 7, moderate risk patient, check UDS and if consistent may be able to assume this   Discussed with pt and agrees with plan

## 2012-11-19 NOTE — Telephone Encounter (Signed)
Pt called because she needs a refill on her name brand Duragesic patches. She was seen in the pain management clinic but they can not give her any prescriptions until after the 12/18 when her drug test come back. The doctor there said to have her PCP write one month worth for until she see this doctor. JW

## 2012-11-20 MED ORDER — FENTANYL 50 MCG/HR TD PT72: 50.0000 ug | MEDICATED_PATCH | TRANSDERMAL | Status: DC

## 2012-11-20 NOTE — Telephone Encounter (Signed)
Reviewed pain management note. I am willing to do 1 month supply with no further refills. I have printed this and placed in front office box.

## 2012-11-20 NOTE — Telephone Encounter (Signed)
Pt informed. Novalynn Branaman Dawn  

## 2012-11-22 ENCOUNTER — Telehealth: Payer: Self-pay | Admitting: Family Medicine

## 2012-11-22 DIAGNOSIS — K047 Periapical abscess without sinus: Secondary | ICD-10-CM

## 2012-11-22 MED ORDER — PENICILLIN V POTASSIUM 500 MG PO TABS: 500.0000 mg | ORAL_TABLET | Freq: Three times a day (TID) | ORAL | Status: DC

## 2012-11-22 NOTE — Telephone Encounter (Signed)
Due to size of infection. Asked patient to return in a week. Please have her schedule for today or tomorrow. Will give 3 days of antibiotics but she needs to be seen. Preferably she needs to go to a dentist.

## 2012-11-22 NOTE — Telephone Encounter (Signed)
Pt called because she still has the infection in her mouth. The doctor lanced it on her last visit but it is still infected and needs more antibiotics. jw

## 2012-11-23 NOTE — Telephone Encounter (Signed)
Nothing available today.  Pt has appt on Tuesday with Dr. Durene Cal and informed her that 3 days of abx were sent in so she can get them and take them this weekend.  Pt has appt 12/18/12 with the dental clinic. Fleeger, Maryjo [place]

## 2012-11-27 ENCOUNTER — Ambulatory Visit (INDEPENDENT_AMBULATORY_CARE_PROVIDER_SITE_OTHER): Payer: No Typology Code available for payment source | Admitting: Family Medicine

## 2012-11-27 ENCOUNTER — Encounter: Payer: Self-pay | Admitting: Family Medicine

## 2012-11-27 VITALS — BP 128/78 | HR 96 | Temp 98.4°F | Wt 296.0 lb

## 2012-11-27 DIAGNOSIS — K047 Periapical abscess without sinus: Secondary | ICD-10-CM

## 2012-11-27 MED ORDER — CEPHALEXIN 500 MG PO CAPS: 500.0000 mg | ORAL_CAPSULE | Freq: Three times a day (TID) | ORAL | Status: DC

## 2012-11-27 MED ORDER — HYDROCODONE-ACETAMINOPHEN 5-325 MG PO TABS: 1.0000 | ORAL_TABLET | Freq: Four times a day (QID) | ORAL | Status: DC | PRN

## 2012-11-27 NOTE — Patient Instructions (Signed)
You really need to have this tooth taken out. There is little more that I can do for you. I have changed your antibiotics to Keflex and given you pain medicine. Please call every dentist you can and please try to be seen within 10 days while you still have antibiotics on board. Be sure to tell them you have been on antibiotics for over 2 weeks.   See me in February for your diabetes,  Dr. Durene Cal

## 2012-11-27 NOTE — Progress Notes (Signed)
Look like pt had rx from [facility] practice last month

## 2012-11-27 NOTE — Assessment & Plan Note (Addendum)
Previous I+D (which patient states closed within 36 hours). Area of fluctuance toward back of abscess which I did not feel comfortable approaching to incise again. I think overall the lesion has slightly improved as well as pain. Has had 10 days of PCN but requests something "stronger". Given Keflex. Asked patient to call every dentist she can find to try to have this looked at within 10 days. Still has some vicodin but gave an extra #15 in case she runs out before I am back in office on December 2nd. Once again, if febrile, should go to ED.

## 2012-11-27 NOTE — Progress Notes (Signed)
Tana Conch, MD Phone: [phone]  Subjective:  Chief complaint-noted  # Dental Abscess Since 11/13/12. Was seen in clinic 11/16/12 and had drainage (which I felt to be incomplete vs. Large areas of induration) behind left lateral incisor. Since I+d, patient denies fevers/chills/expanding redness. No shortness of breath though pills sometimes hit the area before she swallows. Has not been able to get appointment before 12/18/12 with her dentist. Pain is moderate now and she still has some vicodin left. Compliant with penicillin (10 days of antibiotics so far).  ROS-Denies nausea/vomiting/fatigue/overall sick feelings. Denies chest pain or shortness of breath.   Past Medical History Patient Active Problem List   Diagnosis Date Noted  . Tobacco abuse 04/25/2011    Priority: High  . Diabetes mellitus, type II, insulin dependent 04/17/2008    Priority: High  . Diabetic neuropathy, painful 03/10/2010    Priority: Medium  . BIPOLAR DISORDER UNSPECIFIED 06/05/2009    Priority: Medium  . BACK PAIN, CHRONIC 06/05/2009    Priority: Medium  . Obesity, morbid, BMI 50 or higher 04/21/2008    Priority: Medium  . HYPERTENSION 04/21/2008    Priority: Medium  . HYPERLIPIDEMIA 04/17/2008    Priority: Medium  . Irritable bowel syndrome 04/17/2008    Priority: Medium  . Skin lesion of left leg 04/21/2011    Priority: Low  . Venous insufficiency 03/11/2010    Priority: Low  . ANXIETY 04/21/2008    Priority: Low  . COLONIC POLYPS, HYPERPLASTIC, HX OF 04/17/2008    Priority: Low  . Dental abscess 11/16/2012    Medications- reviewed and updated Current Outpatient Prescriptions on File Prior to Visit  Medication Sig Dispense Refill  . aspirin 81 MG tablet Take 81 mg by mouth daily.      . Blood Glucose Monitoring Suppl (PRODIGY AUTOCODE BLOOD GLUCOSE) W/DEVICE KIT by Does not apply route.        . clonazePAM (KLONOPIN) 1 MG tablet Take 1 mg by mouth 3 (three) times daily as needed. Dr.  Valinda Hoar prescribed (psychiatric NP)  30 tablet    . desloratadine (CLARINEX) 5 MG tablet Take 1 tablet (5 mg total) by mouth daily.  90 tablet  3  . diclofenac (VOLTAREN) 75 MG EC tablet Take 1 tablet (75 mg total) by mouth 2 (two) times daily as needed.  30 tablet  1  . DULoxetine (CYMBALTA) 60 MG capsule Take 120 mg by mouth daily.        Marland Kitchen esomeprazole (NEXIUM) 40 MG capsule Take 1 capsule (40 mg total) by mouth daily.  30 capsule  11  . fentaNYL (DURAGESIC) 50 MCG/HR Place 1 patch (50 mcg total) onto the skin every 3 (three) days.  10 patch  0  . gabapentin (NEURONTIN) 600 MG tablet Take 600 mg by mouth 3 (three) times daily.      Marland Kitchen glucose blood (PRODIGY TEST) test strip 1 each by Other route as needed. Test 4 times a day       . insulin aspart (NOVOLOG) 100 UNIT/ML injection Inject as directed  for sliding scale insulin Dispense 1 vial  10 mL  11  . insulin glargine (LANTUS) 100 UNIT/ML injection Inject 70 Units into the skin daily.       . INSULIN SYRINGE .5CC/29G (RELION INSULIN SYR 0.5CC/29G) 29G X 1/2" 0.5 ML MISC Use for insulin administration QID  100 each  11  . Insulin Syringe-Needle U-100 (INSULIN SYRINGE 1CC/28G) 28G X 1/2" 1 ML MISC 1 Device by Does not  apply route 4 (four) times daily. Pt requires syringe for >50 units of insulin  200 each  11  . lamoTRIgine (LAMICTAL) 100 MG tablet Take 100 mg by mouth 2 (two) times daily.        . Lancets MISC by Does not apply route. Four times a day       . metFORMIN (GLUCOPHAGE) 1000 MG tablet Take 1 tablet (1,000 mg total) by mouth 2 (two) times daily with a meal.  180 tablet  4  . methocarbamol (ROBAXIN) 500 MG tablet Take 500 mg by mouth every 6 (six) hours as needed. For spasms per Dr. Alveda Reasons       . Omega-3 Fatty Acids (FISH OIL) 300 MG CAPS Take 1 capsule by mouth daily.        . pravastatin (PRAVACHOL) 80 MG tablet Take 1 tablet (80 mg total) by mouth daily.  30 tablet  6  . quinapril (ACCUPRIL) 20 MG tablet Take 1 tablet (20 mg  total) by mouth at bedtime.  30 tablet  11  . risperiDONE (RISPERDAL) 0.5 MG tablet Take 2 mg by mouth at bedtime.       . traZODone (DESYREL) 100 MG tablet Take 100 mg by mouth at bedtime.         No current facility-administered medications on file prior to visit.    Objective: BP 128/78  Pulse 96  Temp(Src) 98.4 F (36.9 C) (Oral)  Wt 296 lb (134.265 kg) Gen: appears slightly uncomfortable at rest, pain much more minimal on dental exam Mouth: left lateral incisor broken off, inflammed gums around this (improved from last week) 2.5 x 2cm dental abscess noted behind this with one deep area of fluctuance (which I did not feel comfortable approaching with a scalpel) CV: RRR no murmurs rubs or gallops Lungs: CTAB no crackles, wheeze, rhonchi  Assessment/Plan:

## 2012-12-05 ENCOUNTER — Other Ambulatory Visit: Payer: Self-pay | Admitting: Family Medicine

## 2012-12-05 MED ORDER — INSULIN ASPART 100 UNIT/ML ~~LOC~~ SOLN: SUBCUTANEOUS | Status: DC

## 2012-12-20 ENCOUNTER — Ambulatory Visit (HOSPITAL_BASED_OUTPATIENT_CLINIC_OR_DEPARTMENT_OTHER): Payer: No Typology Code available for payment source | Admitting: Physical Medicine & Rehabilitation

## 2012-12-20 ENCOUNTER — Encounter: Payer: No Typology Code available for payment source | Attending: Physical Medicine & Rehabilitation

## 2012-12-20 ENCOUNTER — Encounter: Payer: Self-pay | Admitting: Physical Medicine & Rehabilitation

## 2012-12-20 VITALS — BP 133/89 | HR 106 | Resp 16 | Ht 64.0 in | Wt 298.0 lb

## 2012-12-20 DIAGNOSIS — G8928 Other chronic postprocedural pain: Secondary | ICD-10-CM | POA: Insufficient documentation

## 2012-12-20 DIAGNOSIS — M47817 Spondylosis without myelopathy or radiculopathy, lumbosacral region: Secondary | ICD-10-CM

## 2012-12-20 DIAGNOSIS — M545 Low back pain, unspecified: Secondary | ICD-10-CM

## 2012-12-20 DIAGNOSIS — IMO0002 Reserved for concepts with insufficient information to code with codable children: Secondary | ICD-10-CM | POA: Insufficient documentation

## 2012-12-20 DIAGNOSIS — K047 Periapical abscess without sinus: Secondary | ICD-10-CM

## 2012-12-20 DIAGNOSIS — M961 Postlaminectomy syndrome, not elsewhere classified: Secondary | ICD-10-CM | POA: Insufficient documentation

## 2012-12-20 MED ORDER — HYDROCODONE-ACETAMINOPHEN 5-325 MG PO TABS: 1.0000 | ORAL_TABLET | Freq: Four times a day (QID) | ORAL | Status: DC | PRN

## 2012-12-20 MED ORDER — DURAGESIC-50 50 MCG/HR TD PT72: 50.0000 ug | MEDICATED_PATCH | TRANSDERMAL | Status: DC

## 2012-12-20 NOTE — Patient Instructions (Signed)

## 2012-12-20 NOTE — Progress Notes (Signed)
Bilateral T 12, L1, L2 medial branch blocks under fluoroscopic guidance  Indication: Lumbar pain which is not relieved by medication management or other conservative care and interfering with self-care and mobility.  Informed consent was obtained after describing risks and benefits of the procedure with the patient, this includes bleeding, bruising, infection, paralysis and medication side effects. The patient wishes to proceed and has given written consent. The patient was placed in a prone position. The lumbar area was marked and prepped with Betadine. One ML of 1% lidocaine was injected into each of 6 areas into the skin and subcutaneous tissue. Then a 22-gauge 5inch spinal needle was inserted targeting the junction of the left L3 superior articular process /transverse process junction. Needle was advanced under fluoroscopic guidance. Bone contact was made. Omnipaque 180 was injected x0.5 mL demonstrating no intravascular uptake. Then a solution containing one ML of 4 mg per mL dexamethasone and 3 mL of 2% MPF lidocaine was injected x0.5 mL. Then the left L2 superior articular process in transverse process junction was targeted. Bone contact was made. Omnipaque 180 was injected x0.5 mL demonstrating no intravascular uptake. Then a solution containing one ML of 4 mg per mL dexamethasone and 3 mL of 2% MPF lidocaine was injected x0.5 mL. Then the left L1 superior articular process in transverse process junction was targeted. Bone contact was made. Omnipaque 180 was injected x0.5 mL demonstrating no intravascular uptake. Then a solution containing one ML of 4 mg per mL dexamethasone and 3 mL of 2% MPF lidocaine was injected x0.5 mL. this same procedure was performed on the right side using the same needle, technique, and injectate. Patient tolerated procedure well. Post procedure instructions were given. Please refer to post procedure form.

## 2012-12-20 NOTE — Progress Notes (Signed)
  PROCEDURE RECORD [facility]   Name: DEARA BOBER DOB:1965-02-05 MRN: [id number]  Date:12/20/2012  Physician: Claudette Laws, MD    Nurse/CMA: Kerin Perna  Allergies:  Allergies  Allergen Reactions  . Abilify [Aripiprazole]     Shortness of breath, cramps, shakes   . Lithium     Kidneys stop    Consent Signed: yes  Is patient diabetic? yes  CBG today? 123  Pregnant: no LMP: No LMP recorded. Patient has had a hysterectomy. (age 15-55)  Anticoagulants: no Anti-inflammatory: no Antibiotics: no  Procedure: Bilateral medial branch block  Position: Prone Start Time:  158 End Time: 214  Fluoro Time: 46  RN/CMA Levens,CMA Levens,CMA    Time 110 218    BP 133/89 135/75    Pulse 106 101    Respirations 16 16    O2 Sat 96 97    S/S 6 6    Pain Level 9/10 5/10     D/C home with Daughter, [name] & O X 3, D/C instructions reviewed, and sits independently.

## 2012-12-24 ENCOUNTER — Other Ambulatory Visit: Payer: Self-pay | Admitting: Family Medicine

## 2012-12-24 DIAGNOSIS — M549 Dorsalgia, unspecified: Secondary | ICD-10-CM

## 2012-12-24 MED ORDER — DICLOFENAC SODIUM 75 MG PO TBEC: 75.0000 mg | DELAYED_RELEASE_TABLET | Freq: Two times a day (BID) | ORAL | Status: DC | PRN

## 2013-01-01 ENCOUNTER — Telehealth: Payer: Self-pay | Admitting: Family Medicine

## 2013-01-01 ENCOUNTER — Other Ambulatory Visit: Payer: Self-pay | Admitting: Family Medicine

## 2013-01-01 DIAGNOSIS — E785 Hyperlipidemia, unspecified: Secondary | ICD-10-CM

## 2013-01-01 MED ORDER — PRAVASTATIN SODIUM 80 MG PO TABS: 80.0000 mg | ORAL_TABLET | Freq: Every day | ORAL | Status: DC

## 2013-01-01 NOTE — Telephone Encounter (Signed)
[facility] called into [facility], pt informed. Christine Bradshaw, Christine Bradshaw

## 2013-01-01 NOTE — Telephone Encounter (Signed)
Pt called and needs [place] to fax pravastatin to the [facility]. Myriam Jacobson

## 2013-01-08 ENCOUNTER — Telehealth: Payer: Self-pay

## 2013-01-08 NOTE — Telephone Encounter (Signed)
Patient says her injection helped for about 3 days but then went back hurting.  She has not taken the hydrocodone much and still has some.  Advised her to take some of that and try heat and ice.  She will call back if she gets worse.

## 2013-01-25 ENCOUNTER — Encounter: Payer: No Typology Code available for payment source | Attending: Physical Medicine & Rehabilitation

## 2013-01-25 ENCOUNTER — Encounter: Payer: Self-pay | Admitting: Physical Medicine & Rehabilitation

## 2013-01-25 ENCOUNTER — Ambulatory Visit (HOSPITAL_BASED_OUTPATIENT_CLINIC_OR_DEPARTMENT_OTHER): Payer: No Typology Code available for payment source | Admitting: Physical Medicine & Rehabilitation

## 2013-01-25 VITALS — BP 152/85 | HR 110 | Resp 14 | Ht 64.0 in | Wt 299.0 lb

## 2013-01-25 DIAGNOSIS — M545 Low back pain, unspecified: Secondary | ICD-10-CM

## 2013-01-25 DIAGNOSIS — IMO0002 Reserved for concepts with insufficient information to code with codable children: Secondary | ICD-10-CM | POA: Insufficient documentation

## 2013-01-25 DIAGNOSIS — M961 Postlaminectomy syndrome, not elsewhere classified: Secondary | ICD-10-CM | POA: Insufficient documentation

## 2013-01-25 DIAGNOSIS — G8928 Other chronic postprocedural pain: Secondary | ICD-10-CM | POA: Insufficient documentation

## 2013-01-25 MED ORDER — DURAGESIC-50 50 MCG/HR TD PT72: 50.0000 ug | MEDICATED_PATCH | TRANSDERMAL | Status: DC

## 2013-01-25 MED ORDER — OXYCODONE HCL 5 MG PO TABS: 5.0000 mg | ORAL_TABLET | Freq: Four times a day (QID) | ORAL | Status: DC | PRN

## 2013-01-25 NOTE — Patient Instructions (Addendum)
Next visit is for repeat lumbar injections  Stop hydrocodone  Start oxycodone as needed  Continue Duragesic patch, if injections helpful may be able to reduce dose or discontinue

## 2013-01-25 NOTE — Progress Notes (Signed)
Subjective:    Patient ID: Christine Bradshaw, female    DOB: 07-03-1965, 48 y.o.   MRN: [id number] Complaint is chronic low back pain Secondary complaint bilateral hip pain as well as left shooting pain down to the toes.  Preoperatively patient had pain into the right lower extremity. Postoperatively patient had pain into the left lower extremity. She states that she can touch in area on her low back and cause some pins and needles sensation.  Symptoms have been progressive over time.  Past medical history significant for diabetic neuropathy however she states that the pain is limited to the left foot.  Bipolar disorder has been controlled with medications. Still follows with psychiatry. Morbid obesity weight has been stable.  Was started on a fentanyl patch by orthopedic surgery approximately 18 months ago. Takes hydrocodone once or twice per day. Normally she takes his only every few days or every few weeks. Tried gabapentin but was not effective Takes Cymbalta prescribed by psychiatry.   HPI Bilateral T12 L1-L2 medial branch blocks 12/20/2012 pre-injection pain 9 post injection pain 5, pain relief lasted 3-4 days. Pain Inventory Average Pain 8 Pain Right Now 8 My pain is sharp, burning, dull and stabbing  In the last 24 hours, has pain interfered with the following? General activity 8 Relation with others 7 Enjoyment of life 7 What TIME of day is your pain at its worst? daytime Sleep (in general) Fair  Pain is worse with: walking, standing and some activites Pain improves with: rest and heat/ice Relief from Meds: 5  Mobility walk with assistance use a walker ability to climb steps?  yes do you drive?  no use a wheelchair Do you have any goals in this area?  yes  Function not employed: date last employed 1999 I need assistance with the following:  dressing, bathing, meal prep, household duties and shopping Do you have any goals in this area?   yes  Neuro/Psych numbness tingling spasms depression anxiety  Prior Studies Any changes since last visit?  no  Physicians involved in your care Any changes since last visit?  no   Family History  Problem Relation Age of Onset  . Hyperlipidemia Father   . Hypertension Father   . Heart disease Father    History   Social History  . Marital Status: Married    Spouse Name: N/A    Number of Children: N/A  . Years of Education: N/A   Occupational History  . disability    Social History Main Topics  . Smoking status: Current Every Day Smoker -- 1.50 packs/day    Types: Cigarettes  . Smokeless tobacco: None  . Alcohol Use: No  . Drug Use: No  . Sexual Activity: Yes    Partners: Male   Other Topics Concern  . None   Social History Narrative   Husband on disability   Pt also on disability   Past Surgical History  Procedure Laterality Date  . Tonsillectomy    . Cholecystectomy    . Appendectomy    . Esophagogastroduodenoscopy  2010    showed erosions  . Colonoscopy w/ biopsies  2010    adenomatous polyp repeat 2015  . Lumbar fusion  2011    L4-L5-S1 Dr. Marcial Pacas   Past Medical History  Diagnosis Date  . Hypertension   . Depression   . Irritable bowel syndrome   . Diabetes mellitus type II   . Panic attack as reaction to stress   . Insomnia   .  Tremor   . Memory loss of unknown cause   . Back pain, chronic   . HX OF GALLSTONE 04/21/2008    Qualifier: Diagnosis of  By: Julaine Hua CMA ([place]), Estill Bamberg    . HEADACHE, CHRONIC 04/21/2008   BP 152/85  Pulse 110  Resp 14  Ht 5\' 4"  (1.626 m)  Wt 299 lb (135.626 kg)  BMI 51.30 kg/m2  SpO2 97%      Review of Systems  Musculoskeletal: Positive for back pain.  Neurological: Positive for numbness.       Tingling, spasms  Psychiatric/Behavioral: Positive for dysphoric mood. The patient is nervous/anxious.   All other systems reviewed and are negative.       Objective:   Physical Exam  Tenderness to  palpation in the L2 L3-L4 paraspinal muscles. Negative straight leg raising Bilateral hip flexor, knee extensors, ankle dorsiflexor plantar flexor-strength is normal Mood and affect are appropriate Vitals reviewed       Assessment & Plan:  1. Lumbar postlaminectomy syndrome status post L4-5 and L5-S1 fusion now with facet joint mediated pain above the level of the fusion which has responded well to medial branch blocks. It will repeat these x1 and if she has a similar good but temporary relief we'll proceed to radio frequency ablation.  Continue Duragesic patch 50 mcg q. 72 hours no signs of this use no signs of significant side effects Trial oxycodone 5 mg in place of hydrocodone on a when necessary basis. She takes this less than one time per day will give 15 tablets

## 2013-02-25 ENCOUNTER — Other Ambulatory Visit: Payer: Self-pay | Admitting: *Deleted

## 2013-02-25 ENCOUNTER — Encounter: Payer: Self-pay | Admitting: *Deleted

## 2013-02-25 DIAGNOSIS — M549 Dorsalgia, unspecified: Secondary | ICD-10-CM

## 2013-02-25 MED ORDER — DICLOFENAC SODIUM 75 MG PO TBEC: 75.0000 mg | DELAYED_RELEASE_TABLET | Freq: Two times a day (BID) | ORAL | Status: DC | PRN

## 2013-02-25 MED ORDER — INSULIN GLARGINE 100 UNIT/ML ~~LOC~~ SOLN: 70.0000 [IU] | Freq: Every day | SUBCUTANEOUS | Status: DC

## 2013-02-25 NOTE — Telephone Encounter (Signed)
Faxed to health department.

## 2013-02-26 ENCOUNTER — Encounter: Payer: Self-pay | Admitting: Physical Medicine & Rehabilitation

## 2013-02-26 ENCOUNTER — Encounter: Payer: No Typology Code available for payment source | Attending: Physical Medicine & Rehabilitation

## 2013-02-26 ENCOUNTER — Ambulatory Visit (HOSPITAL_BASED_OUTPATIENT_CLINIC_OR_DEPARTMENT_OTHER): Payer: No Typology Code available for payment source | Admitting: Physical Medicine & Rehabilitation

## 2013-02-26 VITALS — BP 131/70 | HR 103 | Resp 14 | Ht 65.0 in | Wt 299.0 lb

## 2013-02-26 DIAGNOSIS — M961 Postlaminectomy syndrome, not elsewhere classified: Secondary | ICD-10-CM | POA: Insufficient documentation

## 2013-02-26 DIAGNOSIS — M545 Low back pain, unspecified: Secondary | ICD-10-CM

## 2013-02-26 DIAGNOSIS — IMO0002 Reserved for concepts with insufficient information to code with codable children: Secondary | ICD-10-CM | POA: Insufficient documentation

## 2013-02-26 DIAGNOSIS — M47817 Spondylosis without myelopathy or radiculopathy, lumbosacral region: Secondary | ICD-10-CM

## 2013-02-26 DIAGNOSIS — G8928 Other chronic postprocedural pain: Secondary | ICD-10-CM | POA: Insufficient documentation

## 2013-02-26 MED ORDER — OXYCODONE HCL 5 MG PO TABS: 5.0000 mg | ORAL_TABLET | Freq: Four times a day (QID) | ORAL | Status: DC | PRN

## 2013-02-26 MED ORDER — FENTANYL 25 MCG/HR TD PT72: 25.0000 ug | MEDICATED_PATCH | TRANSDERMAL | Status: DC

## 2013-02-26 MED ORDER — OXYCODONE HCL 5 MG PO TABS: 5.0000 mg | ORAL_TABLET | Freq: Every day | ORAL | Status: DC

## 2013-02-26 MED ORDER — DURAGESIC-50 50 MCG/HR TD PT72: 50.0000 ug | MEDICATED_PATCH | TRANSDERMAL | Status: DC

## 2013-02-26 NOTE — Progress Notes (Signed)
Bilateral T 12, L1, L2 medial branch blocks under fluoroscopic guidance  Indication: Lumbar pain which is not relieved by medication management or other conservative care and interfering with self-care and mobility.  Informed consent was obtained after describing risks and benefits of the procedure with the patient, this includes bleeding, bruising, infection, paralysis and medication side effects. The patient wishes to proceed and has given written consent. The patient was placed in a prone position. The lumbar area was marked and prepped with Betadine. One ML of 1% lidocaine was injected into each of 6 areas into the skin and subcutaneous tissue. Then a 22-gauge 5inch spinal needle was inserted targeting the junction of the left L3 superior articular process /transverse process junction. Needle was advanced under fluoroscopic guidance. Bone contact was made. Omnipaque 180 was injected x0.5 mL demonstrating no intravascular uptake. Then a solution containing one ML of 4 mg per mL dexamethasone and 3 mL of 2% MPF lidocaine was injected x0.5 mL. Then the left L2 superior articular process in transverse process junction was targeted. Bone contact was made. Omnipaque 180 was injected x0.5 mL demonstrating no intravascular uptake. Then a solution containing one ML of 4 mg per mL dexamethasone and 3 mL of 2% MPF lidocaine was injected x0.5 mL. Then the left L1 superior articular process in transverse process junction was targeted. Bone contact was made. Omnipaque 180 was injected x0.5 mL demonstrating no intravascular uptake. Then a solution containing one ML of 4 mg per mL dexamethasone and 3 mL of 2% MPF lidocaine was injected x0.5 mL. this same procedure was performed on the right side using the same needle, technique, and injectate. Patient tolerated procedure well. Post procedure instructions were given. Please refer to post procedure form.  Preinjection pain 9/10 Post injection pain 4/10

## 2013-02-26 NOTE — Patient Instructions (Addendum)

## 2013-02-26 NOTE — Progress Notes (Signed)
  PROCEDURE RECORD [facility]   Name: STORMI VANDEVELDE DOB:[birth year] MRN: [id number]  Date:02/26/2013  Physician: Alysia Penna, MD    Nurse/CMA: Vonna Drafts, CMA  Allergies:  Allergies  Allergen Reactions  . Abilify [Aripiprazole]     Shortness of breath, cramps, shakes   . Lithium     Kidneys stop    Consent Signed: yes  Is patient diabetic? yes  CBG today? 126  Pregnant: no LMP: No LMP recorded. Patient has had a hysterectomy. (age 1-55)  Anticoagulants: no Anti-inflammatory: no Antibiotics: no  Procedure: Medial branch block  Position: Prone Start Time: 1108  End Time: 1119  Fluoro Time: 37  RN/CMA Charlii Yost,CMA Lilla Callejo,CMA    Time 1054 1123    BP 131/70 124/76    Pulse 103 97    Respirations 14 14    O2 Sat 93 97    S/S 6 6    Pain Level 9/10 4/10     D/C home with Husband Herbie [place], [name] & O X 3, D/C instructions reviewed, and sits independently.

## 2013-03-04 ENCOUNTER — Telehealth: Payer: Self-pay | Admitting: Family Medicine

## 2013-03-04 MED ORDER — INSULIN GLARGINE 100 UNIT/ML ~~LOC~~ SOLN: 70.0000 [IU] | Freq: Every day | SUBCUTANEOUS | Status: DC

## 2013-03-04 NOTE — Telephone Encounter (Signed)
Refilled and placed in to be faxed. Please inform patient. I have not received anything from health department -please inform patient of this fact.

## 2013-03-04 NOTE — Telephone Encounter (Signed)
Would like lantus refilled today. She has very little left Says the health dept has been faxing [place] since Feb 20 to get this refilled

## 2013-03-05 ENCOUNTER — Telehealth: Payer: Self-pay

## 2013-03-05 NOTE — Telephone Encounter (Signed)
Pt informed. Christine Bradshaw  

## 2013-03-05 NOTE — Telephone Encounter (Signed)
Patient has a follow up appt on 3/27 to discuss back injections.(If patient is a candidate) She wanted to know if she can go ahead and scheduled her next appt for her back injection now instead of waiting? Patient is afraid if she schedules the injection at her follow up appt then she will have to wait 4-6 weeks for a open appt.

## 2013-03-05 NOTE — Telephone Encounter (Signed)
[place] office scheduled patient appointment for a RF. Patient is aware.

## 2013-03-05 NOTE — Telephone Encounter (Signed)
So looking at her procedures it looks like she had positive benefit from 2 sats of T12 L1-L2 medial branch blocks. We can set her up for a right T12 L1-L2 medial branch RF for next visit, need 30 minutes

## 2013-03-08 ENCOUNTER — Telehealth: Payer: Self-pay | Admitting: *Deleted

## 2013-03-08 ENCOUNTER — Encounter (HOSPITAL_COMMUNITY): Payer: Self-pay | Admitting: *Deleted

## 2013-03-08 NOTE — Telephone Encounter (Signed)
Patient is due for recall colonoscopy in April 2015 for history of colon polyps. Her procedure must be done at the hospital with mac (pt BMI>50). Patient would like to schedule colonoscopy now. She has been scheduled for colonoscopy with propofol on 03/13/13 @ 11:30 am. Her previsit is scheduled for Monday, 03/11/13 @ 2:30 pm. Patient verbalizes understanding of the above.

## 2013-03-11 ENCOUNTER — Encounter (HOSPITAL_COMMUNITY): Payer: Self-pay | Admitting: Pharmacy Technician

## 2013-03-11 ENCOUNTER — Ambulatory Visit (AMBULATORY_SURGERY_CENTER): Payer: Self-pay | Admitting: *Deleted

## 2013-03-11 VITALS — Ht 64.0 in | Wt 302.2 lb

## 2013-03-11 DIAGNOSIS — Z860100 Personal history of colon polyps, unspecified: Secondary | ICD-10-CM

## 2013-03-11 NOTE — Progress Notes (Signed)
Patient denies any allergies to eggs or soy. Patient denies any problems with anesthesia. Patient will do the Miralax/Dulcolax split dose prep.

## 2013-03-13 ENCOUNTER — Ambulatory Visit (HOSPITAL_COMMUNITY): Payer: No Typology Code available for payment source | Admitting: Certified Registered Nurse Anesthetist

## 2013-03-13 ENCOUNTER — Encounter (HOSPITAL_COMMUNITY): Payer: No Typology Code available for payment source | Admitting: Certified Registered Nurse Anesthetist

## 2013-03-13 ENCOUNTER — Other Ambulatory Visit: Payer: Self-pay | Admitting: *Deleted

## 2013-03-13 ENCOUNTER — Ambulatory Visit (HOSPITAL_COMMUNITY)
Admission: RE | Admit: 2013-03-13 | Discharge: 2013-03-13 | Disposition: A | Discharge status: Home / Self Care | Payer: No Typology Code available for payment source | Source: Ambulatory Visit | Attending: Internal Medicine | Admitting: Internal Medicine

## 2013-03-13 ENCOUNTER — Encounter (HOSPITAL_COMMUNITY): Payer: Self-pay | Admitting: *Deleted

## 2013-03-13 ENCOUNTER — Encounter (HOSPITAL_COMMUNITY)
Admission: RE | Disposition: A | Discharge status: Home / Self Care | Payer: Self-pay | Source: Ambulatory Visit | Attending: Internal Medicine

## 2013-03-13 DIAGNOSIS — F17200 Nicotine dependence, unspecified, uncomplicated: Secondary | ICD-10-CM | POA: Insufficient documentation

## 2013-03-13 DIAGNOSIS — Z860100 Personal history of colon polyps, unspecified: Secondary | ICD-10-CM

## 2013-03-13 DIAGNOSIS — I10 Essential (primary) hypertension: Secondary | ICD-10-CM | POA: Insufficient documentation

## 2013-03-13 DIAGNOSIS — Z6841 Body Mass Index (BMI) 40.0 and over, adult: Secondary | ICD-10-CM | POA: Insufficient documentation

## 2013-03-13 DIAGNOSIS — E1142 Type 2 diabetes mellitus with diabetic polyneuropathy: Secondary | ICD-10-CM | POA: Insufficient documentation

## 2013-03-13 DIAGNOSIS — G8928 Other chronic postprocedural pain: Secondary | ICD-10-CM | POA: Insufficient documentation

## 2013-03-13 DIAGNOSIS — M961 Postlaminectomy syndrome, not elsewhere classified: Secondary | ICD-10-CM | POA: Insufficient documentation

## 2013-03-13 DIAGNOSIS — Z1211 Encounter for screening for malignant neoplasm of colon: Secondary | ICD-10-CM

## 2013-03-13 DIAGNOSIS — Z9089 Acquired absence of other organs: Secondary | ICD-10-CM | POA: Insufficient documentation

## 2013-03-13 DIAGNOSIS — K5730 Diverticulosis of large intestine without perforation or abscess without bleeding: Secondary | ICD-10-CM | POA: Insufficient documentation

## 2013-03-13 DIAGNOSIS — E1149 Type 2 diabetes mellitus with other diabetic neurological complication: Secondary | ICD-10-CM | POA: Insufficient documentation

## 2013-03-13 DIAGNOSIS — F319 Bipolar disorder, unspecified: Secondary | ICD-10-CM | POA: Insufficient documentation

## 2013-03-13 DIAGNOSIS — Z981 Arthrodesis status: Secondary | ICD-10-CM | POA: Insufficient documentation

## 2013-03-13 HISTORY — DX: Other complications of anesthesia, initial encounter: T88.59XA

## 2013-03-13 HISTORY — DX: Adverse effect of unspecified anesthetic, initial encounter: T41.45XA

## 2013-03-13 HISTORY — DX: Gastro-esophageal reflux disease without esophagitis: K21.9

## 2013-03-13 HISTORY — DX: Personal history of other diseases of the digestive system: Z87.19

## 2013-03-13 HISTORY — DX: Unspecified osteoarthritis, unspecified site: M19.90

## 2013-03-13 HISTORY — PX: COLONOSCOPY WITH PROPOFOL: SHX5780

## 2013-03-13 LAB — GLUCOSE, CAPILLARY: Glucose-Capillary: 108 mg/dL — ABNORMAL HIGH (ref 70–99)

## 2013-03-13 SURGERY — COLONOSCOPY WITH PROPOFOL
Anesthesia: Monitor Anesthesia Care

## 2013-03-13 MED ORDER — LIDOCAINE HCL (CARDIAC) 20 MG/ML IV SOLN
INTRAVENOUS | Status: DC | PRN
  Administered 2013-03-13: 100 mg via INTRAVENOUS

## 2013-03-13 MED ORDER — PROPOFOL 10 MG/ML IV BOLUS
INTRAVENOUS | Status: DC | PRN
  Administered 2013-03-13: 60 mg via INTRAVENOUS

## 2013-03-13 MED ORDER — LIDOCAINE HCL (CARDIAC) 20 MG/ML IV SOLN
INTRAVENOUS | Status: AC
  Filled 2013-03-13: qty 5

## 2013-03-13 MED ORDER — SODIUM CHLORIDE 0.9 % IV SOLN: INTRAVENOUS | Status: DC

## 2013-03-13 MED ORDER — PROPOFOL INFUSION 10 MG/ML OPTIME
INTRAVENOUS | Status: DC | PRN
  Administered 2013-03-13: 160 ug/kg/min via INTRAVENOUS

## 2013-03-13 MED ORDER — ONDANSETRON HCL 4 MG/2ML IJ SOLN
INTRAMUSCULAR | Status: AC
  Filled 2013-03-13: qty 2

## 2013-03-13 MED ORDER — PROPOFOL 10 MG/ML IV BOLUS
INTRAVENOUS | Status: AC
  Filled 2013-03-13: qty 20

## 2013-03-13 MED ORDER — ONDANSETRON HCL 4 MG/2ML IJ SOLN
INTRAMUSCULAR | Status: DC | PRN
  Administered 2013-03-13: 4 mg via INTRAVENOUS

## 2013-03-13 SURGICAL SUPPLY — 22 items

## 2013-03-13 NOTE — Transfer of Care (Signed)
Immediate Anesthesia Transfer of Care Note  Patient: Christine Bradshaw  Procedure(s) Performed: Procedure(s): COLONOSCOPY WITH PROPOFOL (N/A)  Patient Location: PACU  Anesthesia Type:MAC  Level of Consciousness: awake, alert  and oriented  Airway & Oxygen Therapy: Patient Spontanous Breathing and Patient connected to face mask oxygen  Post-op Assessment: Report given to PACU RN and Post -op Vital signs reviewed and stable  Post vital signs: Reviewed and stable  Complications: No apparent anesthesia complications

## 2013-03-13 NOTE — Transfer of Care (Signed)
Immediate Anesthesia Transfer of Care Note  Patient: Christine Bradshaw  Procedure(s) Performed: Procedure(s): COLONOSCOPY WITH PROPOFOL (N/A)  Patient Location: PACU  Anesthesia Type:MAC  Level of Consciousness: awake, alert  and oriented  Airway & Oxygen Therapy: Patient Spontanous Breathing and Patient connected to face mask oxygen  Post-op Assessment: Report given to PACU RN and Post -op Vital signs reviewed and stable  Post vital signs: Reviewed and stable  Complications: No apparent anesthesia complications 

## 2013-03-13 NOTE — Anesthesia Postprocedure Evaluation (Signed)
Anesthesia Post Note  Patient: Christine Bradshaw  Procedure(s) Performed: Procedure(s) (LRB): COLONOSCOPY WITH PROPOFOL (N/A)  Anesthesia type: MAC  Patient location: PACU  Post pain: Pain level controlled  Post assessment: Post-op Vital signs reviewed  Last Vitals: BP 123/82  Temp(Src) 36.4 C (Oral)  Resp 21  Ht 5\' 5"  (1.651 m)  Wt 297 lb (134.718 kg)  BMI 49.42 kg/m2  SpO2 97%  Post vital signs: Reviewed  Level of consciousness: awake  Complications: No apparent anesthesia complications

## 2013-03-13 NOTE — Op Note (Signed)
[facility] [place], [id number]   COLONOSCOPY PROCEDURE REPORT  PATIENT: Christine, Bradshaw  MR#: [id number] BIRTHDATE: [birth year] , 47  yrs. old GENDER: Female ENDOSCOPIST: Lafayette Dragon, MD REFERRED BY: PROCEDURE DATE:  03/13/2013 PROCEDURE:   Dr Garret Reddish First Screening Colonoscopy - Avg.  risk and is 50 yrs.  old or older Yes.  First Screening Colonoscopy - Avg.  risk and is 50 yrs. old or older - No.      History of Adenoma - Now for follow-up colonoscopy & has been > or = to 3 yrs.  N/A  Polyps Removed Today? No.  Recommend repeat exam, <10 yrs? No. ASA CLASS:   Class III INDICATIONS:Average risk patient for colon cancer and history of hyperplastic polyp in December 1996,. MEDICATIONS: MAC sedation, administered by CRNA  DESCRIPTION OF PROCEDURE:   After the risks benefits and alternatives of the procedure were thoroughly explained, informed consent was obtained.  A digital rectal exam revealed no abnormalities of the rectum.   The Pentax Ped Colon [id number] endoscope was introduced through the anus and advanced to the cecum, which was identified by both the appendix and ileocecal valve. No adverse events experienced.   The quality of the prep was good, using MoviPrep  The instrument was then slowly withdrawn as the colon was fully examined.      COLON FINDINGS: There was moderate diverticulosis noted in the sigmoid colon with associated tortuosity, angulation and colonic spasm.  Retroflexed views revealed no abnormalities. The time to cecum=8 minutes 25 seconds.  Withdrawal time=7 minutes 25 seconds. The scope was withdrawn and the procedure completed. COMPLICATIONS: There were no complications.  ENDOSCOPIC IMPRESSION: There was moderate diverticulosis noted in the sigmoid colon  RECOMMENDATIONS: high-fiber diet Metamucil 1 heaping teaspoon daily Recall colonoscopy in 10 years   eSigned:  Lafayette Dragon, MD 03/13/2013  12:05 PM   cc:   PATIENT NAME:  Christine, Bradshaw MR#: [id number]

## 2013-03-13 NOTE — H&P (Signed)
Subjective:   Patient ID: Christine Bradshaw, female DOB: 06-27-1965, 48 y.o. MRN: [id number]  HPI  Complaint is chronic low back pain  Secondary complaint bilateral hip pain as well as left shooting pain down to the toes.  Preoperatively patient had pain into the right lower extremity. Postoperatively patient had pain into the left lower extremity. She states that she can touch in area on her low back and cause some pins and needles sensation.  Symptoms have been progressive over time.  Past medical history significant for diabetic neuropathy however she states that the pain is limited to the left foot. Bipolar disorder has been controlled with medications. Still follows with psychiatry. Morbid obesity weight has been stable.  Was started on a fentanyl patch by orthopedic surgery approximately 18 months ago. Takes hydrocodone once or twice per day. Normally she takes his only every few days or every few weeks. Tried gabapentin but was not effective Takes Cymbalta prescribed by psychiatry.  No history of spine injections. Tried physical therapy, patient states that it makes things worse  Review of Systems  Constitutional: Positive for unexpected weight change.  Gastrointestinal: Positive for constipation.  Musculoskeletal: Positive for back pain and gait problem.  Neurological: Positive for numbness.  Psychiatric/Behavioral: Positive for dysphoric mood. The patient is nervous/anxious.  All other systems reviewed and are negative.  Objective:   Physical Exam  Nursing note and vitals reviewed.  Constitutional: She is oriented to person, place, and time. She appears well-developed.  Morbid obesity  HENT:  Head: Normocephalic and atraumatic.  Right Ear: External ear normal.  Left Ear: External ear normal.  Nose: Nose normal.  Mouth/Throat: Oropharynx is clear and moist.  Eyes: Conjunctivae and EOM are normal. Pupils are equal, round, and reactive to light.  Neck: Normal range of motion. Neck  supple.  Cardiovascular: Regular rhythm and intact distal pulses. Tachycardia present.  Pulmonary/Chest: Effort normal and breath sounds normal.  Musculoskeletal:  Lumbar back: She exhibits decreased range of motion, tenderness and deformity. She exhibits no edema and no spasm.  Normal lumbar flexion Reduced lumbar extension Reduced lumbar lateral bending Tenderness to palpation bilateral lumbar paraspinals L4-S1 Decreased motion in the lower lumbar area  Neurological: She is alert and oriented to person, place, and time. A sensory deficit is present. No cranial nerve deficit. She exhibits normal muscle tone.  Reflex Scores:  Tricep reflexes are 2+ on the right side and 2+ on the left side.  Bicep reflexes are 2+ on the right side and 2+ on the left side.  Brachioradialis reflexes are 2+ on the right side and 2+ on the left side.  Patellar reflexes are 2+ on the right side and 2+ on the left side.  Achilles reflexes are 1+ on the right side and 1+ on the left side. Decreased sensation left L5 and left S1 dermatomes 4 minus/5 left ankle dorsiflexion otherwise 5/5 in bilateral upper and lower extremities  Pain Inventory  Average Pain 8  Pain Right Now 8  My pain is sharp, burning, dull, stabbing, tingling and aching  In the last 24 hours, has pain interfered with the following?  General activity 8  Relation with others 7  Enjoyment of life 9  What TIME of day is your pain at its worst? daytime  Sleep (in general) Fair  Pain is worse with: walking, sitting, standing and some activites  Pain improves with: rest, heat/ice and medication  Relief from Meds: 5  Mobility  walk without assistance  walk with  assistance  use a walker  how many minutes can you walk? 5  ability to climb steps? yes  do you drive? no  use a wheelchair  Do you have any goals in this area? yes  Function  not employed: date last employed 1999  I need assistance with the following: dressing, bathing, meal  prep, household duties and shopping  Neuro/Psych  numbness  tingling  trouble walking  spasms  depression  anxiety  Prior Studies  x-rays  CT/MRI  Physicians involved in your care  Orthopedist [facility] [place]  History reviewed. No pertinent family history.  History    Social History   .  Marital Status:  Married     Spouse Name:  N/A     Number of Children:  N/A   .  Years of Education:  N/A    Occupational History   .  disability     Social History Main Topics   .  Smoking status:  Current Every Day Smoker -- 1.50 packs/day     Types:  Cigarettes   .  Smokeless tobacco:  None   .  Alcohol Use:  No   .  Drug Use:  No   .  Sexual Activity:  Yes     Partners:  Male    Other Topics  Concern   .  None    Social History Narrative    Husband on disability    Pt also on disability    Past Surgical History   Procedure  Laterality  Date   .  Tonsillectomy     .  Cholecystectomy     .  Appendectomy     .  Esophagogastroduodenoscopy   2010     showed erosions   .  Colonoscopy w/ biopsies   2010     adenomatous polyp repeat 2015   .  Lumbar fusion   2011     L4-L5-S1 Dr. Marcial Pacas    Past Medical History   Diagnosis  Date   .  Hypertension    .  Depression    .  Irritable bowel syndrome    .  Diabetes mellitus type II    .  Panic attack as reaction to stress    .  Insomnia    .  Tremor    .  Memory loss of unknown cause    .  Back pain, chronic    .  HX OF GALLSTONE  04/21/2008     Qualifier: Diagnosis of By: Julaine Hua CMA ([place]), Estill Bamberg   .  HEADACHE, CHRONIC  04/21/2008   BP 122/76  Pulse 117  Resp 16  Ht 5' 4.5" (1.638 m)  Wt 298 lb (135.172 kg)  BMI 50.38 kg/m2  SpO2 94%  Assessment & Plan:   1. Lumbar postlaminectomy syndrome with chronic postoperative pain as well as left lower extremity chronic radiculitis L5-S1.  Her axial back pain appears to be related to facet arthrosis  Would recommend trial of T12, L1,L2 MBB and if >50% relief on 2 occasions, would  be candidate for radiofrequency ablation  May benefit from trial of Lyrica, however with her polypharmacy related to bipolar d/o there would be increased chance of interaction.  In regards to opiates, has been on long term with partial relief

## 2013-03-13 NOTE — Anesthesia Preprocedure Evaluation (Signed)
Anesthesia Evaluation  Patient identified by MRN, date of birth, ID band Patient awake    Reviewed: Allergy & Precautions, H&P , NPO status , Patient's Chart, lab work & pertinent test results  Airway       Dental   Pulmonary Current Smoker,          Cardiovascular Exercise Tolerance: Poor hypertension, Pt. on medications + Peripheral Vascular Disease     Neuro/Psych  Headaches, PSYCHIATRIC DISORDERS Anxiety Depression    GI/Hepatic Neg liver ROS, hiatal hernia, GERD-  Medicated,  Endo/Other  diabetes, Type 2, Oral Hypoglycemic AgentsMorbid obesity  Renal/GU negative Renal ROS     Musculoskeletal negative musculoskeletal ROS (+)   Abdominal   Peds  Hematology negative hematology ROS (+)   Anesthesia Other Findings   Reproductive/Obstetrics negative OB ROS                           Anesthesia Physical Anesthesia Plan  ASA: IV  Anesthesia Plan: MAC   Post-op Pain Management:    Induction: Intravenous  Airway Management Planned:   Additional Equipment:   Intra-op Plan:   Post-operative Plan:   Informed Consent: I have reviewed the patients History and Physical, chart, labs and discussed the procedure including the risks, benefits and alternatives for the proposed anesthesia with the patient or authorized representative who has indicated his/her understanding and acceptance.   Dental advisory given  Plan Discussed with: CRNA  Anesthesia Plan Comments:         Anesthesia Quick Evaluation

## 2013-03-14 ENCOUNTER — Encounter (HOSPITAL_COMMUNITY): Payer: Self-pay | Admitting: Internal Medicine

## 2013-03-14 LAB — POCT I-STAT 4, (NA,K, GLUC, HGB,HCT)
Glucose, Bld: 132 mg/dL — ABNORMAL HIGH (ref 70–99)
HCT: 33 % — ABNORMAL LOW (ref 36.0–46.0)
Hemoglobin: 11.2 g/dL — ABNORMAL LOW (ref 12.0–15.0)
Potassium: 4.6 mEq/L (ref 3.7–5.3)
Sodium: 140 mEq/L (ref 137–147)

## 2013-03-20 ENCOUNTER — Other Ambulatory Visit: Payer: Self-pay | Admitting: Family Medicine

## 2013-03-20 DIAGNOSIS — Z1231 Encounter for screening mammogram for malignant neoplasm of breast: Secondary | ICD-10-CM

## 2013-03-21 ENCOUNTER — Telehealth: Payer: Self-pay

## 2013-03-21 NOTE — Telephone Encounter (Signed)
Contacted patient to inform her that per Dr. Letta Pate she could take Oxy IR 5 mg BID until her next visit on 3/27.

## 2013-03-21 NOTE — Telephone Encounter (Signed)
Patient called requesting she take 2 oxycodone.  Her injection is wearing off and she is in more pain.  Please advise.

## 2013-03-21 NOTE — Telephone Encounter (Signed)
OK to increase Oxy IR 5mg  BID unti next visit , schedule Repeat T12,L1,L2 MBB

## 2013-03-28 ENCOUNTER — Ambulatory Visit (HOSPITAL_COMMUNITY)
Admission: RE | Admit: 2013-03-28 | Discharge: 2013-03-28 | Disposition: A | Discharge status: Home / Self Care | Payer: No Typology Code available for payment source | Source: Ambulatory Visit | Attending: Family Medicine | Admitting: Family Medicine

## 2013-03-28 DIAGNOSIS — Z1231 Encounter for screening mammogram for malignant neoplasm of breast: Secondary | ICD-10-CM

## 2013-03-29 ENCOUNTER — Ambulatory Visit (HOSPITAL_BASED_OUTPATIENT_CLINIC_OR_DEPARTMENT_OTHER): Payer: No Typology Code available for payment source | Admitting: Physical Medicine & Rehabilitation

## 2013-03-29 ENCOUNTER — Encounter: Payer: No Typology Code available for payment source | Attending: Physical Medicine & Rehabilitation

## 2013-03-29 ENCOUNTER — Encounter: Payer: Self-pay | Admitting: Physical Medicine & Rehabilitation

## 2013-03-29 VITALS — BP 133/83 | HR 113 | Resp 16 | Ht 65.0 in | Wt 297.0 lb

## 2013-03-29 DIAGNOSIS — M47817 Spondylosis without myelopathy or radiculopathy, lumbosacral region: Secondary | ICD-10-CM

## 2013-03-29 DIAGNOSIS — G8928 Other chronic postprocedural pain: Secondary | ICD-10-CM | POA: Insufficient documentation

## 2013-03-29 DIAGNOSIS — M961 Postlaminectomy syndrome, not elsewhere classified: Secondary | ICD-10-CM | POA: Insufficient documentation

## 2013-03-29 DIAGNOSIS — IMO0002 Reserved for concepts with insufficient information to code with codable children: Secondary | ICD-10-CM | POA: Insufficient documentation

## 2013-03-29 MED ORDER — FENTANYL 50 MCG/HR TD PT72: 50.0000 ug | MEDICATED_PATCH | TRANSDERMAL | Status: DC

## 2013-03-29 MED ORDER — OXYCODONE HCL 5 MG PO TABS: 5.0000 mg | ORAL_TABLET | Freq: Every day | ORAL | Status: DC

## 2013-03-29 NOTE — Patient Instructions (Signed)
Next visit will be for radiofrequency procedure we will do the left side first, the following month do the right side

## 2013-03-29 NOTE — Progress Notes (Signed)
Subjective:    Patient ID: Christine Bradshaw, female    DOB: 18-Feb-1965, 48 y.o.   MRN: [id number]  HPI Bilateral T12 L1-L2 medial branch blocks under fluoroscopic guidance performed on 02/26/2013. Pain was reduced by 50% for 2-1/2 weeks. This was longer than the previous injection in December at the same levels that lasted 4 days. Patient was able to go on the treadmill for up to 5 minutes. She was able to stand and walk much more easily.  Her pain is now back to her usual level 8/10 Pain Inventory Average Pain 5 Pain Right Now 8 My pain is sharp, burning, dull, stabbing and aching  In the last 24 hours, has pain interfered with the following? General activity 8 Relation with others 8 Enjoyment of life 8 What TIME of day is your pain at its worst? daytime Sleep (in general) Fair  Pain is worse with: walking, standing and some activites Pain improves with: rest, heat/ice, medication and injections Relief from Meds: 6  Mobility use a walker how many minutes can you walk? 5 ability to climb steps?  yes do you drive?  no use a wheelchair transfers alone  Function not employed: date last employed . I need assistance with the following:  dressing, meal prep, household duties and shopping Do you have any goals in this area?  yes  Neuro/Psych numbness tingling trouble walking spasms depression anxiety  Prior Studies Any changes since last visit?  no  Physicians involved in your care Any changes since last visit?  no   Family History  Problem Relation Age of Onset  . Hyperlipidemia Father   . Hypertension Father   . Heart disease Father   . Colon polyps Neg Hx    History   Social History  . Marital Status: Married    Spouse Name: N/A    Number of Children: N/A  . Years of Education: N/A   Occupational History  . disability    Social History Main Topics  . Smoking status: Current Every Day Smoker -- 1.50 packs/day    Types: Cigarettes  . Smokeless  tobacco: Never Used  . Alcohol Use: No  . Drug Use: No  . Sexual Activity: Yes    Partners: Male   Other Topics Concern  . None   Social History Narrative   Husband on disability   Pt also on disability   Past Surgical History  Procedure Laterality Date  . Tonsillectomy    . Cholecystectomy    . Appendectomy    . Esophagogastroduodenoscopy  2010    showed erosions  . Colonoscopy w/ biopsies  2010    adenomatous polyp repeat 2015  . Lumbar fusion  2011    L4-L5-S1 Dr. Marcial Pacas  . Abdominal hysterectomy    . Colonoscopy with propofol N/A 03/13/2013    Procedure: COLONOSCOPY WITH PROPOFOL;  Surgeon: Lafayette Dragon, MD;  Location: [facility];  Service: Endoscopy;  Laterality: N/A;   Past Medical History  Diagnosis Date  . Hypertension   . Depression   . Irritable bowel syndrome   . Diabetes mellitus type II   . Panic attack as reaction to stress   . Insomnia     03-08-13"states is in control"  . Tremor   . Memory loss of unknown cause   . Back pain, chronic   . HX OF GALLSTONE 04/21/2008    Qualifier: Diagnosis of  By: Julaine Hua CMA ([place]), Estill Bamberg    . HEADACHE, CHRONIC 04/21/2008  .  Complication of anesthesia     manic episode  . GERD (gastroesophageal reflux disease)   . H/O hiatal hernia   . Arthritis    BP 133/83  Pulse 113  Resp 16  Ht 5\' 5"  (1.651 m)  Wt 297 lb (134.718 kg)  BMI 49.42 kg/m2  SpO2 97%  Opioid Risk Score:   Fall Risk Score: Low Fall Risk (0-5 points) (patient educated handout declined)   Review of Systems  Musculoskeletal: Positive for back pain and gait problem.  Neurological: Positive for numbness.  Psychiatric/Behavioral: Positive for dysphoric mood. The patient is nervous/anxious.   All other systems reviewed and are negative.       Objective:   Physical Exam  Tenderness to palpation lumbar paraspinal muscles around L4 area Patient has fair flexion but limited extension. Straight leg raising test is negative Lower extremity  strength is normal General no acute distress Mood and affect appropriate Ambulates with a walker no evidence of foot drag      Assessment & Plan:  1. Lumbar spondylosis at levels above fusion. Has responded well to bilateral T12 L1-L2 medial branch blocks which were performed in February 2015 as well as December 2014. On both occasions pain relief was greater than 50% but was only temporary. We discussed treatment options including radio frequency neurotomy which may give relief for an average of 10 months. We discussed that procedure can cause some soreness after the injections for up to 1 month. Will therefore do the left  side the first month and right side the following month  Will do left-sided RF 04/11/2013 Increase Duragesic 50 mcg q. 72 hours x2 weeks 5 patches Increased oxycodone 5 mg twice a day for 2 weeks #30 tablets We'll schedule right-sided RF 1 month from now  After first RF will reduce Duragesic 25 mcg q. 72 hours and increased oxycodone 10 mg twice a day  Over half of the 25 min visit was spent counseling and coordinating care.

## 2013-04-01 ENCOUNTER — Ambulatory Visit (HOSPITAL_COMMUNITY): Payer: No Typology Code available for payment source

## 2013-04-01 ENCOUNTER — Telehealth: Payer: Self-pay

## 2013-04-01 NOTE — Telephone Encounter (Signed)
Patient called regarding her radiofrequency.  She had questions about the pre-procedure instructions.  All questions answered.  Reveiwed blood thinners and she is not taking any.  She is aware to bring a driver.

## 2013-04-11 ENCOUNTER — Ambulatory Visit (HOSPITAL_BASED_OUTPATIENT_CLINIC_OR_DEPARTMENT_OTHER): Payer: No Typology Code available for payment source | Admitting: Physical Medicine & Rehabilitation

## 2013-04-11 ENCOUNTER — Other Ambulatory Visit: Payer: Self-pay

## 2013-04-11 ENCOUNTER — Encounter: Payer: Self-pay | Admitting: Physical Medicine & Rehabilitation

## 2013-04-11 ENCOUNTER — Encounter: Payer: No Typology Code available for payment source | Attending: Physical Medicine & Rehabilitation

## 2013-04-11 VITALS — BP 134/68 | HR 91 | Resp 14 | Ht 65.0 in | Wt 297.0 lb

## 2013-04-11 DIAGNOSIS — G8928 Other chronic postprocedural pain: Secondary | ICD-10-CM | POA: Insufficient documentation

## 2013-04-11 DIAGNOSIS — M961 Postlaminectomy syndrome, not elsewhere classified: Secondary | ICD-10-CM | POA: Insufficient documentation

## 2013-04-11 DIAGNOSIS — M549 Dorsalgia, unspecified: Secondary | ICD-10-CM

## 2013-04-11 DIAGNOSIS — G8929 Other chronic pain: Secondary | ICD-10-CM

## 2013-04-11 DIAGNOSIS — M47817 Spondylosis without myelopathy or radiculopathy, lumbosacral region: Secondary | ICD-10-CM

## 2013-04-11 DIAGNOSIS — Z5181 Encounter for therapeutic drug level monitoring: Secondary | ICD-10-CM

## 2013-04-11 DIAGNOSIS — IMO0002 Reserved for concepts with insufficient information to code with codable children: Secondary | ICD-10-CM | POA: Insufficient documentation

## 2013-04-11 DIAGNOSIS — Z79899 Other long term (current) drug therapy: Secondary | ICD-10-CM

## 2013-04-11 MED ORDER — GABAPENTIN 600 MG PO TABS: 600.0000 mg | ORAL_TABLET | Freq: Three times a day (TID) | ORAL | Status: DC

## 2013-04-11 MED ORDER — FENTANYL 50 MCG/HR TD PT72: 50.0000 ug | MEDICATED_PATCH | TRANSDERMAL | Status: DC

## 2013-04-11 NOTE — Progress Notes (Signed)
  PROCEDURE RECORD [facility]   Name: Christine Bradshaw DOB:1965/08/21 MRN: [id number]  Date:04/11/2013  Physician: Alysia Penna, MD    Nurse/CMA: Shumaker RN  Allergies:  Allergies  Allergen Reactions  . Abilify [Aripiprazole]     Shortness of breath, cramps, shakes   . Lithium     Kidneys stop    Consent Signed: yes  Is patient diabetic? yes  CBG today? 132  Pregnant: no LMP: No LMP recorded. Patient has had a hysterectomy. (age [age])  Anticoagulants: no Anti-inflammatory: no Antibiotics: no  Procedure: left radiofrequency neurotomy T12-L1-L2 Position: Prone  15 cm probe Start Time: 10:59 End Time: 11:20 Fluoro Time: 52 sec  RN/CMA Biomedical engineer    Time 10:32 11:28    BP 132/68 136/75    Pulse 91 91    Respirations 14 14    O2 Sat 92 97    S/S 6 6    Pain Level 8/10 7/10     D/C home with husband Herbie [place], [name] & O X 3, D/C instructions reviewed, and sits independently.

## 2013-04-11 NOTE — Patient Instructions (Signed)

## 2013-04-11 NOTE — Progress Notes (Signed)
Left T12,L1,L2 medial branch radio frequency neurotomy under fluoroscopic guidance   Indication: Low back pain due to lumbar spondylosis which has been relieved on 2 occasions by greater than 50% by lumbar medial branch blocks at corresponding levels.  Informed consent was obtained after describing risks and benefits of the procedure with the patient, this includes bleeding, bruising, infection, paralysis and medication side effects. The patient wishes to proceed and has given written consent. The patient was placed in a prone position. The lumbar and sacral area was marked and prepped with Betadine. A 25-gauge 1-1/2 inch needle was inserted into the skin and subcutaneous tissue at 3 sites in one ML of 1% lidocaine was injected into each site. Then a 20-gauge 10cm cm radio frequency needle with a 1 cm curved active tip was inserted targeting the Left L3 SAP/transverse process junction. Bone contact was made and confirmed with lateral imaging. Sensory stimulation at 50 Hz followed by motor stimulation at 2 Hz confirm proper needle location followed by injection of one ML of the solution containing one ML of 4 mg per mL dexamethasone and 3 mL of 1% MPF lidocaine. Then the Left L2 SAP/transverse process junction was targeted. Bone contact was made and confirmed with lateral imaging. Sensory stimulation at 50 Hz followed by motor stimulation at 2 Hz confirm proper needle location followed by injection of one ML of the solution containing one ML of 4 mg per mL dexamethasone and 3 mL of 1% MPF lidocaine. Then the Left L1 SAP/transverse process junction was targeted. Bone contact was made and confirmed with lateral imaging. Sensory stimulation at 50 Hz followed by motor stimulation at 2 Hz confirm proper needle location followed by injection of one ML of the solution containing one ML of 4 mg per mL dexamethasone and 3 mL of 1% MPF lidocaine. Radio frequency lesion being at 80C for 90 seconds was performed. Needles  were removed. Post procedure instructions and vital signs were performed. Patient tolerated procedure well. Followup appointment was given. 

## 2013-04-18 ENCOUNTER — Other Ambulatory Visit: Payer: Self-pay | Admitting: Family Medicine

## 2013-04-22 ENCOUNTER — Ambulatory Visit (INDEPENDENT_AMBULATORY_CARE_PROVIDER_SITE_OTHER): Payer: No Typology Code available for payment source | Admitting: Family Medicine

## 2013-04-22 ENCOUNTER — Encounter: Payer: Self-pay | Admitting: Family Medicine

## 2013-04-22 VITALS — BP 145/82 | HR 95 | Temp 98.8°F | Wt 299.0 lb

## 2013-04-22 DIAGNOSIS — E119 Type 2 diabetes mellitus without complications: Secondary | ICD-10-CM

## 2013-04-22 DIAGNOSIS — Z794 Long term (current) use of insulin: Secondary | ICD-10-CM

## 2013-04-22 DIAGNOSIS — I872 Venous insufficiency (chronic) (peripheral): Secondary | ICD-10-CM

## 2013-04-22 LAB — POCT GLYCOSYLATED HEMOGLOBIN (HGB A1C): Hemoglobin A1C: 5.5

## 2013-04-22 MED ORDER — FUROSEMIDE 40 MG PO TABS: 40.0000 mg | ORAL_TABLET | Freq: Every day | ORAL | Status: DC | PRN

## 2013-04-22 NOTE — Assessment & Plan Note (Signed)
Advised of compression stockings but patient resistant to this. Much prefers prn lasix adn states had been stable on this for year. Discussed this is not the ideal treatment but ultimately gave lasix 40 mg to be used prn daily for swelling. Previous workup unremarkable and no CHF symptoms.

## 2013-04-22 NOTE — Assessment & Plan Note (Signed)
Well controlled with a1c of 5.5. 3x a month sliding scale novolog only. No hypoglycemia. Advised could consider decrease in Lantus to 65 units from 70 units but patient would like to stay at current dose.She is aware of hypoglycemia symptoms and knows to call me if she has any of these.

## 2013-04-22 NOTE — Progress Notes (Signed)
Garret Reddish, MD Phone: [phone]  Subjective:   Christine Bradshaw is a 48 y.o. year old very pleasant female patient who presents with the following:  DIABETES Type II Medications taking and tolerating-yes, metformin 2g total daily, lantus 70 units, novolog sliding scale probably 3x a month Blood Sugars per patient-fasting- morning typically 120, before dinner around 120, and at dinner typically around 120 Diet-feels like doing well Regular Exercise-walking on treadmill 4-5 minutes 3x a day, 3 days.   On Aspirin-yes  On statin-pravastatin Daily foot monitoring-yes  ROS- Denies Polyuria,Polydipsia,  Vision changes, feet or hand numbness/pain/tingling. Denies  Hypoglycemia symptoms (shaky, sweaty, hungry, weak anxious, tremor, palpitations, confusion, behavior change).   Hemoglobin a1c:  Lab Results  Component Value Date   HGBA1C 5.5 04/22/2013   HGBA1C 5.5 09/24/2012   HGBA1C 5.9 05/07/2012   Edema Swelling for years including into 20s. Usually controlled with prn lasix. Patient requesting to stop this. Has difficulty losing compression hoses ROS- no chest or shortness of breath. No orthopnea or PND.   Past Medical History- Patient Active Problem List   Diagnosis Date Noted  . Tobacco abuse 04/25/2011    Priority: High  . Diabetes mellitus, type II, insulin dependent 04/17/2008    Priority: High  . Diabetic neuropathy, painful 03/10/2010    Priority: Medium  . BIPOLAR DISORDER UNSPECIFIED 06/05/2009    Priority: Medium  . BACK PAIN, CHRONIC 06/05/2009    Priority: Medium  . Obesity, morbid, BMI 50 or higher 04/21/2008    Priority: Medium  . HYPERTENSION 04/21/2008    Priority: Medium  . HYPERLIPIDEMIA 04/17/2008    Priority: Medium  . Irritable bowel syndrome 04/17/2008    Priority: Medium  . Skin lesion of left leg 04/21/2011    Priority: Low  . Venous insufficiency 03/11/2010    Priority: Low  . ANXIETY 04/21/2008    Priority: Low  . COLONIC POLYPS,  HYPERPLASTIC, HX OF 04/17/2008    Priority: Low  . Special screening for malignant neoplasms, colon 03/13/2013  . Dental abscess 11/16/2012   Medications- reviewed and updated Current Outpatient Prescriptions  Medication Sig Dispense Refill  . aspirin EC 81 MG tablet Take 81 mg by mouth every morning.      . Blood Glucose Monitoring Suppl (PRODIGY AUTOCODE BLOOD GLUCOSE) W/DEVICE KIT by Does not apply route.        . clonazePAM (KLONOPIN) 1 MG tablet Take 1 mg by mouth 3 (three) times daily.      Marland Kitchen desloratadine (CLARINEX) 5 MG tablet Take 1 tablet (5 mg total) by mouth daily.  90 tablet  3  . diclofenac (VOLTAREN) 75 MG EC tablet Take 75 mg by mouth daily.      . DULoxetine (CYMBALTA) 60 MG capsule Take 120 mg by mouth every morning.      Marland Kitchen esomeprazole (NEXIUM) 40 MG capsule Take 40 mg by mouth daily at 12 noon.      . fentaNYL (DURAGESIC - DOSED MCG/HR) 50 MCG/HR Place 1 patch (50 mcg total) onto the skin every 3 (three) days.  10 patch  0  . gabapentin (NEURONTIN) 600 MG tablet Take 1 tablet (600 mg total) by mouth 3 (three) times daily.  3 tablet  1  . glucose blood (PRODIGY TEST) test strip 1 each by Other route as needed. Test 4 times a day       . Hyprom-Naphaz-Polysorb-Zn Sulf (CLEAR EYES COMPLETE) SOLN Apply 1 drop to eye 3 (three) times daily as needed (dry eyes).      Marland Kitchen  ibuprofen (ADVIL,MOTRIN) 200 MG tablet Take 400 mg by mouth every 6 (six) hours as needed.      . insulin aspart (NOVOLOG) 100 UNIT/ML injection Inject into the skin 3 (three) times daily as needed for high blood sugar. Pt is on a sliding scale.      . insulin glargine (LANTUS) 100 UNIT/ML injection Inject 0.7 mLs (70 Units total) into the skin daily.  10 mL  prn  . INSULIN SYRINGE .5CC/29G (RELION INSULIN SYR 0.5CC/29G) 29G X 1/2" 0.5 ML MISC Use for insulin administration QID  100 each  11  . Insulin Syringe-Needle U-100 (INSULIN SYRINGE 1CC/28G) 28G X 1/2" 1 ML MISC 1 Device by Does not apply route 4 (four) times  daily. Pt requires syringe for >50 units of insulin  200 each  11  . lamoTRIgine (LAMICTAL) 100 MG tablet Take 100 mg by mouth 2 (two) times daily.        . Lancets MISC by Does not apply route. Four times a day       . metFORMIN (GLUCOPHAGE) 1000 MG tablet Take 1 tablet (1,000 mg total) by mouth 2 (two) times daily with a meal.  180 tablet  4  . methocarbamol (ROBAXIN) 500 MG tablet Take 500 mg by mouth every 6 (six) hours as needed. For spasms per Dr. Marcial Pacas       . omega-3 acid ethyl esters (LOVAZA) 1 G capsule Take 2 g by mouth daily.      Marland Kitchen oxyCODONE (OXY IR/ROXICODONE) 5 MG immediate release tablet Take 1 tablet (5 mg total) by mouth daily.  30 tablet  0  . pravastatin (PRAVACHOL) 40 MG tablet Take 40 mg by mouth every evening.      . quinapril (ACCUPRIL) 20 MG tablet Take 20 mg by mouth at bedtime.      . risperiDONE (RISPERDAL) 0.25 MG tablet Take 0.25 mg by mouth 3 (three) times daily.      . risperiDONE (RISPERDAL) 2 MG tablet Take 2 mg by mouth at bedtime.      . traZODone (DESYREL) 100 MG tablet Take 100 mg by mouth at bedtime.         No current facility-administered medications for this visit.    Objective: BP 145/82  Pulse 95  Temp(Src) 98.8 F (37.1 C) (Oral)  Wt 299 lb (135.626 kg) Pulse 95 on recheck Gen: NAD, resting comfortably in chair  CV: RRR no murmurs rubs or gallops Lungs: CTAB no crackles, wheeze, rhonchi Ext: trace edema Feet: mild dryness (advised lotion), no obvious skin breakdown  Results for orders placed in visit on 04/22/13 (from the past 24 hour(s))  POCT GLYCOSYLATED HEMOGLOBIN (HGB A1C)     Status: None   Collection Time    04/22/13  1:52 PM      Result Value Ref Range   Hemoglobin A1C 5.5      Assessment/Plan:  Diabetes mellitus, type II, insulin dependent Well controlled with a1c of 5.5. 3x a month sliding scale novolog only. No hypoglycemia. Advised could consider decrease in Lantus to 65 units from 70 units but patient would like to stay  at current dose.She is aware of hypoglycemia symptoms and knows to call me if she has any of these.   Venous insufficiency Advised of compression stockings but patient resistant to this. Much prefers prn lasix adn states had been stable on this for year. Discussed this is not the ideal treatment but ultimately gave lasix 40 mg to be used prn daily  for swelling. Previous workup unremarkable and no CHF symptoms.    Meds ordered this encounter  Medications  . furosemide (LASIX) 40 MG tablet    Sig: Take 1 tablet (40 mg total) by mouth daily as needed for fluid.    Dispense:  30 tablet    Refill:  1

## 2013-04-22 NOTE — Patient Instructions (Signed)
Diabetes-a1c was 5.5!!!! [place] job, keep doing what you are doing.   Fluid-we are going to try your daily as needed fluid pill again. I would much prefer you use compression stockings but I understand these have not been the easiest for you to use in the past and that you prefer lasix.   I am going to miss you and thank you for letting me be your doctor! See [place] back in 3 months.  Dr. Yong Channel

## 2013-04-23 ENCOUNTER — Other Ambulatory Visit: Payer: Self-pay | Admitting: Physical Medicine & Rehabilitation

## 2013-04-23 NOTE — Progress Notes (Signed)
Urine drug screen from 04/11/2013 was negative for oxycodone.  Last dose unknown.

## 2013-05-01 ENCOUNTER — Other Ambulatory Visit: Payer: Self-pay | Admitting: *Deleted

## 2013-05-01 MED ORDER — DICLOFENAC SODIUM 75 MG PO TBEC: 75.0000 mg | DELAYED_RELEASE_TABLET | Freq: Two times a day (BID) | ORAL | Status: DC | PRN

## 2013-05-07 ENCOUNTER — Encounter: Payer: Self-pay | Admitting: Physical Medicine & Rehabilitation

## 2013-05-07 ENCOUNTER — Ambulatory Visit (HOSPITAL_BASED_OUTPATIENT_CLINIC_OR_DEPARTMENT_OTHER): Payer: No Typology Code available for payment source | Admitting: Physical Medicine & Rehabilitation

## 2013-05-07 ENCOUNTER — Encounter: Payer: No Typology Code available for payment source | Attending: Physical Medicine & Rehabilitation

## 2013-05-07 VITALS — BP 127/81 | HR 110 | Resp 14 | Ht 65.0 in | Wt 297.0 lb

## 2013-05-07 DIAGNOSIS — G8928 Other chronic postprocedural pain: Secondary | ICD-10-CM | POA: Insufficient documentation

## 2013-05-07 DIAGNOSIS — M47817 Spondylosis without myelopathy or radiculopathy, lumbosacral region: Secondary | ICD-10-CM

## 2013-05-07 DIAGNOSIS — M961 Postlaminectomy syndrome, not elsewhere classified: Secondary | ICD-10-CM | POA: Insufficient documentation

## 2013-05-07 DIAGNOSIS — IMO0002 Reserved for concepts with insufficient information to code with codable children: Secondary | ICD-10-CM | POA: Insufficient documentation

## 2013-05-07 MED ORDER — OXYCODONE HCL 5 MG PO TABS: 5.0000 mg | ORAL_TABLET | Freq: Every day | ORAL | Status: DC

## 2013-05-07 MED ORDER — FENTANYL 50 MCG/HR TD PT72: 50.0000 ug | MEDICATED_PATCH | TRANSDERMAL | Status: DC

## 2013-05-07 NOTE — Progress Notes (Signed)
Right Radiofrequency neurotomy   Procedure start time 14:53  End time : 15:09  [place]ouro time: 38 seconds.  Vital signs post procedure were 128/80, p 93, rr 14, o2 sat 95  Stewart scale 6  Pain post procedure is 0/10

## 2013-05-07 NOTE — Progress Notes (Signed)
Right T12,L1,L2 medial branch radio frequency neuropathy under fluoroscopic guidance   Indication: Low back pain due to lumbar spondylosis which has been relieved on 2 occasions by greater than 50% by lumbar medial branch blocks at corresponding levels.  Informed consent was obtained after describing risks and benefits of the procedure with the patient, this includes bleeding, bruising, infection, paralysis and medication side effects. The patient wishes to proceed and has given written consent. The patient was placed in a prone position. The lumbar and sacral area was marked and prepped with Betadine. A 25-gauge 1-1/2 inch needle was inserted into the skin and subcutaneous tissue at 3 sites in one ML of 1% lidocaine was injected into each site. Then a 20-gauge 15cm cm radio frequency needle with a 1 cm curved active tip was inserted targeting the Right L3 SAP/transverse process junction. Bone contact was made and confirmed with lateral imaging. Sensory stimulation at 50 Hz followed by motor stimulation at 2 Hz confirm proper needle location followed by injection of one ML of the solution containing one ML of 4 mg per mL dexamethasone and 3 mL of 1% MPF lidocaine. Then the Right L2 SAP/transverse process junction was targeted. Bone contact was made and confirmed with lateral imaging. Sensory stimulation at 50 Hz followed by motor stimulation at 2 Hz confirm proper needle location followed by injection of one ML of the solution containing one ML of 4 mg per mL dexamethasone and 3 mL of 1% MPF lidocaine. Then the Right L1 SAP/transverse process junction was targeted. Bone contact was made and confirmed with lateral imaging. Sensory stimulation at 50 Hz followed by motor stimulation at 2 Hz confirm proper needle location followed by injection of one ML of the solution containing one ML of 4 mg per mL dexamethasone and 3 mL of 1% MPF lidocaine. Radio frequency lesion being at 80C for 90 seconds was performed.  Needles were removed. Post procedure instructions and vital signs were performed. Patient tolerated procedure well. Followup appointment was given. 

## 2013-05-07 NOTE — Progress Notes (Signed)
  PROCEDURE RECORD [facility]   Name: Christine Bradshaw DOB:09-02-1965 MRN: [id number]  Date:05/07/2013  Physician: Alysia Penna, MD    Nurse/CMA: Rapheal Masso,CMA/Shumaker, RN  Allergies:  Allergies  Allergen Reactions  . Abilify [Aripiprazole]     Shortness of breath, cramps, shakes   . Lithium     Kidneys stop    Consent Signed: yes  Is patient diabetic? yes  CBG today? 114  Pregnant: no LMP: No LMP recorded. Patient has had a hysterectomy. (age [age])  Anticoagulants: no Anti-inflammatory: no Antibiotics: no  Procedure: Radiofrequency  Position: Prone Start Time:   End Time:   Fluoro Time:   RN/CMA Adaia Matthies,CMA Shumaker, RN    Time 213     BP 127/81     Pulse 110     Respirations 14     O2 Sat 94     S/S 6     Pain Level 5/10      D/C home with Daughter Christine Bradshaw, [name] & O X 3, D/C instructions reviewed, and sits independently.

## 2013-05-07 NOTE — Patient Instructions (Signed)

## 2013-05-16 ENCOUNTER — Ambulatory Visit: Payer: Self-pay

## 2013-05-31 ENCOUNTER — Telehealth: Payer: Self-pay

## 2013-05-31 NOTE — Telephone Encounter (Signed)
Patient decided to keep appointment

## 2013-05-31 NOTE — Telephone Encounter (Signed)
Have patient make an appointment with nurse practitioner Reduced fentanyl to 25 mcg next visit

## 2013-05-31 NOTE — Telephone Encounter (Signed)
Patient orange card expired and she will not be able to keep appointment.  She was wondering if she can get her patches refilled.  She would like to cut down to 74mcg.  She also needs oxycodone refills.  Please advise.

## 2013-06-03 ENCOUNTER — Ambulatory Visit: Payer: Self-pay | Admitting: Physical Medicine & Rehabilitation

## 2013-06-04 ENCOUNTER — Encounter: Payer: Self-pay | Admitting: Physical Medicine & Rehabilitation

## 2013-06-04 ENCOUNTER — Encounter: Payer: No Typology Code available for payment source | Attending: Physical Medicine & Rehabilitation

## 2013-06-04 ENCOUNTER — Ambulatory Visit (HOSPITAL_BASED_OUTPATIENT_CLINIC_OR_DEPARTMENT_OTHER): Payer: Self-pay | Admitting: Physical Medicine & Rehabilitation

## 2013-06-04 ENCOUNTER — Ambulatory Visit: Payer: Self-pay | Admitting: Physical Medicine & Rehabilitation

## 2013-06-04 VITALS — BP 163/84 | HR 102 | Resp 14 | Ht 65.0 in | Wt 295.0 lb

## 2013-06-04 DIAGNOSIS — M961 Postlaminectomy syndrome, not elsewhere classified: Secondary | ICD-10-CM

## 2013-06-04 DIAGNOSIS — G8928 Other chronic postprocedural pain: Secondary | ICD-10-CM | POA: Insufficient documentation

## 2013-06-04 DIAGNOSIS — IMO0002 Reserved for concepts with insufficient information to code with codable children: Secondary | ICD-10-CM | POA: Insufficient documentation

## 2013-06-04 MED ORDER — OXYCODONE HCL 5 MG PO TABS: 5.0000 mg | ORAL_TABLET | Freq: Every day | ORAL | Status: DC

## 2013-06-04 MED ORDER — FENTANYL 25 MCG/HR TD PT72: 25.0000 ug | MEDICATED_PATCH | TRANSDERMAL | Status: DC

## 2013-06-04 NOTE — Patient Instructions (Signed)
Reduced patch strength for next prescription

## 2013-06-04 NOTE — Progress Notes (Signed)
Subjective:    Patient ID: Christine Bradshaw, female    DOB: Mar 28, 1965, 48 y.o.   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 General activity 6 Relation with others 6 Enjoyment of life 6 What TIME of day is your pain at its worst? evening Sleep (in general) Fair  Pain is worse with: walking, bending, standing, some activites and pulling Pain improves with: rest, heat/ice, medication and injections Relief from Meds: 6  Mobility walk without assistance walk with assistance use a walker how many minutes can you walk? 5 ability to climb steps?  yes do you drive?  no transfers alone Do you have any goals in this area?  yes  Function not employed: date last employed na I need assistance with the following:  meal prep, household duties and shopping Do you have any goals in this area?  yes  Neuro/Psych depression anxiety  Prior Studies Any changes since last visit?  no  Physicians involved in your care Any changes since last visit?  no   Family History  Problem Relation Age of Onset  . Hyperlipidemia Father   . Hypertension Father   . Heart disease Father   . Colon polyps Neg Hx    History   Social History  . Marital Status: Married    Spouse Name: N/A    Number of Children: N/A  . Years of Education: N/A   Occupational History  . disability    Social History Main Topics  . Smoking status: Current Every Day Smoker -- 1.50 packs/day   Types: Cigarettes  . Smokeless tobacco: Never Used  . Alcohol Use: No  . Drug Use: No  . Sexual Activity: Yes    Partners: Male   Other Topics Concern  . None   Social History Narrative   Husband on disability   Pt also on disability   Past Surgical History  Procedure Laterality Date  . Tonsillectomy    . Cholecystectomy    . Appendectomy    . Esophagogastroduodenoscopy  2010    showed erosions  . Colonoscopy w/ biopsies  2010    adenomatous polyp repeat 2015  . Lumbar fusion  2011    L4-L5-S1 Dr. Marcial Pacas  . Abdominal hysterectomy    . Colonoscopy with propofol N/A 03/13/2013    Procedure: COLONOSCOPY WITH PROPOFOL;  Surgeon: Lafayette Dragon, MD;  Location: [facility];  Service: Endoscopy;  Laterality: N/A;   Past Medical History  Diagnosis Date  . Hypertension   . Depression   . Irritable bowel syndrome   . Diabetes mellitus type II   . Panic attack as reaction to stress   . Insomnia     03-08-13"states is in control"  . Tremor   . Memory loss of unknown cause   . Back pain, chronic   . HX OF GALLSTONE 04/21/2008    Qualifier: Diagnosis of  By: Julaine Hua CMA ([place]), Estill Bamberg    . HEADACHE, CHRONIC 04/21/2008  .  Complication of anesthesia     manic episode  . GERD (gastroesophageal reflux disease)   . H/O hiatal hernia   . Arthritis    BP 163/84  Pulse 102  Resp 14  Ht 5\' 5"  (1.651 m)  Wt 295 lb (133.811 kg)  BMI 49.09 kg/m2  SpO2 91%  Opioid Risk Score:   Fall Risk Score: Low Fall Risk (0-5 points) (pt educated on fall risk, brochure given to pt previously)   Review of Systems  Musculoskeletal: Positive for back pain.  Psychiatric/Behavioral: The patient is nervous/anxious.        Depression  All other systems reviewed and are negative.      Objective:   Physical Exam  Tenderness to palpation bilateral L4-L5 paraspinal muscles. Lumbar spine range of motion normal flexion able to touch toes Lumbar extension is limited to 25% of normal with and range  pain Left EHL left ankle dorsiflexor 3 minus otherwise strength is 5/5 in bilateral lower extremities  Sensation has tingling paresthesias on the dorsum of the left foot Decreased sensation to pinprick left L5      Assessment & Plan:  1. Lumbar postlaminectomy syndrome- status post L4-5 and L5-S1 fusion approximately 3-1/2 years ago with chronic postoperative pain as well as a chronic left L5 radiculopathy  2. Lumbar spondylosis above the level of the fusion which has responded well to radio frequency ablation Will monitor for increasing pain. May repeat the procedure and 5 months on the right side and in 4 months on the left side   we can reduce the fentanyl patch on 50 mcg to 25 mcg. Continue the oxycodone 5 mg per day as needed Patient has 5 more of the 50 mcg patch as which I asked her to the use up before she starts on the 25 mcg patch RTC 4-6 weeks

## 2013-06-07 ENCOUNTER — Encounter: Payer: Self-pay | Admitting: Physical Medicine & Rehabilitation

## 2013-06-28 ENCOUNTER — Other Ambulatory Visit: Payer: Self-pay | Admitting: *Deleted

## 2013-06-28 ENCOUNTER — Other Ambulatory Visit: Payer: Self-pay | Admitting: Family Medicine

## 2013-06-28 MED ORDER — METFORMIN HCL 1000 MG PO TABS
1000.0000 mg | ORAL_TABLET | Freq: Two times a day (BID) | ORAL | Status: DC
Start: 2013-06-28 — End: 2013-06-28

## 2013-06-28 MED ORDER — METFORMIN HCL 1000 MG PO TABS
1000.0000 mg | ORAL_TABLET | Freq: Two times a day (BID) | ORAL | Status: DC
Start: 2013-06-28 — End: 2014-08-13

## 2013-06-28 NOTE — Progress Notes (Signed)
Changed to [facility] per patient request instead of fax to [facility]

## 2013-07-01 ENCOUNTER — Telehealth: Payer: Self-pay

## 2013-07-01 MED ORDER — FENTANYL 25 MCG/HR TD PT72: 25.0000 ug | MEDICATED_PATCH | TRANSDERMAL | Status: DC

## 2013-07-01 NOTE — Telephone Encounter (Signed)
Patient called requesting fentanyl 60mcg refill.  She was out of town and only received 5 patches 06/12/2013.  New rx printed to be signed.  Will contact patient when ready for pick up.

## 2013-07-02 NOTE — Telephone Encounter (Signed)
Notified RX available for pick up

## 2013-07-16 ENCOUNTER — Encounter: Payer: No Typology Code available for payment source | Admitting: Registered Nurse

## 2013-07-17 ENCOUNTER — Ambulatory Visit: Payer: No Typology Code available for payment source

## 2013-07-23 ENCOUNTER — Encounter: Payer: Self-pay | Admitting: Registered Nurse

## 2013-07-23 ENCOUNTER — Encounter
Payer: No Typology Code available for payment source | Attending: Physical Medicine & Rehabilitation | Admitting: Registered Nurse

## 2013-07-23 VITALS — BP 149/96 | HR 104 | Resp 16 | Ht 65.0 in | Wt 286.0 lb

## 2013-07-23 DIAGNOSIS — G8929 Other chronic pain: Secondary | ICD-10-CM

## 2013-07-23 DIAGNOSIS — IMO0002 Reserved for concepts with insufficient information to code with codable children: Secondary | ICD-10-CM | POA: Insufficient documentation

## 2013-07-23 DIAGNOSIS — M549 Dorsalgia, unspecified: Secondary | ICD-10-CM

## 2013-07-23 DIAGNOSIS — G8928 Other chronic postprocedural pain: Secondary | ICD-10-CM | POA: Insufficient documentation

## 2013-07-23 DIAGNOSIS — Z5181 Encounter for therapeutic drug level monitoring: Secondary | ICD-10-CM

## 2013-07-23 DIAGNOSIS — M47817 Spondylosis without myelopathy or radiculopathy, lumbosacral region: Secondary | ICD-10-CM

## 2013-07-23 DIAGNOSIS — M961 Postlaminectomy syndrome, not elsewhere classified: Secondary | ICD-10-CM | POA: Insufficient documentation

## 2013-07-23 DIAGNOSIS — Z79899 Other long term (current) drug therapy: Secondary | ICD-10-CM

## 2013-07-23 MED ORDER — FENTANYL 25 MCG/HR TD PT72: 25.0000 ug | MEDICATED_PATCH | TRANSDERMAL | Status: DC

## 2013-07-23 MED ORDER — OXYCODONE HCL 5 MG PO TABS: 5.0000 mg | ORAL_TABLET | Freq: Every day | ORAL | Status: DC

## 2013-07-23 NOTE — Progress Notes (Signed)
Subjective:    Patient ID: Christine Bradshaw, female    DOB: 03/25/1965, 48 y.o.   MRN: [id number]  HPI: Christine Bradshaw is a 48 year old female who returns for follow up for chronic pain and medication refill. She says her pain is located in her mid back left side and radiates laterally. She rates her pain 4. Her current exercise regime is performing stretching exercises and leg lifts daily. She also uses her treadmill once a day for 4-7 minutes. She states she is starting to feel better and she has begun to lose weight. She was encouraged to stay with healthy Diet and exercise regime. She verbalized understanding.  Pain Inventory Average Pain 4 Pain Right Now 4 My pain is sharp and dull  In the last 24 hours, has pain interfered with the following? General activity 6 Relation with others 5 Enjoyment of life 5 What TIME of day is your pain at its worst? daytime Sleep (in general) Fair  Pain is worse with: walking, standing and some activites Pain improves with: rest, heat/ice, therapy/exercise, medication and injections Relief from Meds: 5  Mobility walk without assistance use a walker how many minutes can you walk? 7 ability to climb steps?  yes do you drive?  no use a wheelchair transfers alone Do you have any goals in this area?  yes  Function not employed: date last employed . I need assistance with the following:  household duties and shopping Do you have any goals in this area?  yes  Neuro/Psych depression anxiety  Prior Studies Any changes since last visit?  no  Physicians involved in your care Any changes since last visit?  no   Family History  Problem Relation Age of Onset  . Hyperlipidemia Father   . Hypertension Father   . Heart disease Father   . Colon polyps Neg Hx    History   Social History  . Marital Status: Married    Spouse Name: N/A    Number of Children: N/A  . Years of Education: N/A   Occupational History  . disability      Social History Main Topics  . Smoking status: Current Every Day Smoker -- 1.50 packs/day    Types: Cigarettes  . Smokeless tobacco: Never Used  . Alcohol Use: No  . Drug Use: No  . Sexual Activity: Yes    Partners: Male   Other Topics Concern  . None   Social History Narrative   Husband on disability   Pt also on disability   Past Surgical History  Procedure Laterality Date  . Tonsillectomy    . Cholecystectomy    . Appendectomy    . Esophagogastroduodenoscopy  2010    showed erosions  . Colonoscopy w/ biopsies  2010    adenomatous polyp repeat 2015  . Lumbar fusion  2011    L4-L5-S1 Dr. Marcial Pacas  . Abdominal hysterectomy    . Colonoscopy with propofol N/A 03/13/2013    Procedure: COLONOSCOPY WITH PROPOFOL;  Surgeon: Lafayette Dragon, MD;  Location: [facility];  Service: Endoscopy;  Laterality: N/A;   Past Medical History  Diagnosis Date  . Hypertension   . Depression   . Irritable bowel syndrome   . Diabetes mellitus type II   . Panic attack as reaction to stress   . Insomnia     03-08-13"states is in control"  . Tremor   . Memory loss of unknown cause   . Back pain, chronic   .  HX OF GALLSTONE 04/21/2008    Qualifier: Diagnosis of  By: Julaine Hua CMA ([place]), Estill Bamberg    . HEADACHE, CHRONIC 04/21/2008  . Complication of anesthesia     manic episode  . GERD (gastroesophageal reflux disease)   . H/O hiatal hernia   . Arthritis    BP 149/96  Pulse 104  Resp 16  Ht 5\' 5"  (1.651 m)  Wt 286 lb (129.729 kg)  BMI 47.59 kg/m2  SpO2 95%  Opioid Risk Score:   Fall Risk Score: Low Fall Risk (0-5 points) (patient educated handout declined)   Review of Systems  Musculoskeletal: Positive for back pain.  Psychiatric/Behavioral: Positive for dysphoric mood. The patient is nervous/anxious.   All other systems reviewed and are negative.      Objective:   Physical Exam  Nursing note and vitals reviewed. Constitutional: She is oriented to person, place, and time. She  appears well-developed and well-nourished.  HENT:  Head: Normocephalic and atraumatic.  Neck: Normal range of motion. Neck supple.  Cardiovascular: Normal rate, regular rhythm and normal heart sounds.   Pulmonary/Chest: Effort normal and breath sounds normal.  Musculoskeletal:  Normal Muscle Bulk and Muscle testing Reveals: Upper Extremities: Full ROM and Muscle strength 5/5 Spinal Forward Flexion 90 Degrees and extension 20 Degrees. Thoracic and Lumbar Hypersensitivity Lower Extremities: Full ROM and Muscle strength 5/5 Left Leg Flexion Produces Pain into Left Hip. Arises from chair with ease Narrow based gait  Neurological: She is alert and oriented to person, place, and time.  Skin: Skin is warm and dry.  Psychiatric: She has a normal mood and affect.          Assessment & Plan:  1. Lumbar postlaminectomy syndrome- status post L4-5 and L5-S1 fusion approximately 3-1/2 years ago with chronic postoperative pain as well as a chronic left L5 radiculopathy. Refilled: Fentanyl Patch 25 mcg one patch every three days #10 and Oxycodone 5 mg daily as needed #30. 2. Lumbar spondylosis above the level of the fusion which has responded well to radio frequency ablation: Will Continue to Monitor. Continue with Exercise regime.  20 minutes of face to face patient care time was spent during this visit. All questions was encouraged and answered.  F/U in 1 month.

## 2013-07-30 ENCOUNTER — Other Ambulatory Visit: Payer: Self-pay | Admitting: *Deleted

## 2013-07-31 MED ORDER — QUINAPRIL HCL 20 MG PO TABS: 20.0000 mg | ORAL_TABLET | Freq: Every day | ORAL | Status: DC

## 2013-08-06 ENCOUNTER — Telehealth: Payer: Self-pay | Admitting: Family Medicine

## 2013-08-06 MED ORDER — QUINAPRIL HCL 20 MG PO TABS: 20.0000 mg | ORAL_TABLET | Freq: Every day | ORAL | Status: DC

## 2013-08-06 NOTE — Telephone Encounter (Signed)
I have sent in a prescription for 15 tablets to [facility] [address] and also printed out a prescription for her to refill with [facility].

## 2013-08-06 NOTE — Telephone Encounter (Signed)
Pt called and needs [place] to call in her Accupril to the health dept. They said they never received a fax from [place]. She also would like 15 pills of this also called in to [facility] on [address] since it take about 2 weeks sometimes for the health dept to process a refill request. She is out and really needs this today. jw

## 2013-08-06 NOTE — Telephone Encounter (Signed)
Pt is aware of this. Jazmin Hartsell,CMA  

## 2013-08-16 ENCOUNTER — Other Ambulatory Visit: Payer: Self-pay | Admitting: Family Medicine

## 2013-08-20 ENCOUNTER — Encounter: Payer: No Typology Code available for payment source | Admitting: Registered Nurse

## 2013-08-22 ENCOUNTER — Other Ambulatory Visit: Payer: Self-pay | Admitting: Family Medicine

## 2013-08-26 ENCOUNTER — Encounter: Payer: Self-pay | Admitting: Registered Nurse

## 2013-08-26 ENCOUNTER — Encounter
Payer: No Typology Code available for payment source | Attending: Physical Medicine & Rehabilitation | Admitting: Registered Nurse

## 2013-08-26 VITALS — BP 131/82 | HR 105 | Resp 14 | Wt 280.0 lb

## 2013-08-26 DIAGNOSIS — Z79899 Other long term (current) drug therapy: Secondary | ICD-10-CM

## 2013-08-26 DIAGNOSIS — G8929 Other chronic pain: Secondary | ICD-10-CM

## 2013-08-26 DIAGNOSIS — M961 Postlaminectomy syndrome, not elsewhere classified: Secondary | ICD-10-CM | POA: Insufficient documentation

## 2013-08-26 DIAGNOSIS — IMO0002 Reserved for concepts with insufficient information to code with codable children: Secondary | ICD-10-CM | POA: Insufficient documentation

## 2013-08-26 DIAGNOSIS — M47817 Spondylosis without myelopathy or radiculopathy, lumbosacral region: Secondary | ICD-10-CM

## 2013-08-26 DIAGNOSIS — Z5181 Encounter for therapeutic drug level monitoring: Secondary | ICD-10-CM

## 2013-08-26 DIAGNOSIS — G8928 Other chronic postprocedural pain: Secondary | ICD-10-CM | POA: Insufficient documentation

## 2013-08-26 DIAGNOSIS — M549 Dorsalgia, unspecified: Secondary | ICD-10-CM

## 2013-08-26 MED ORDER — METHOCARBAMOL 500 MG PO TABS
500.0000 mg | ORAL_TABLET | Freq: Three times a day (TID) | ORAL | Status: DC | PRN
Start: 2013-08-26 — End: 2014-05-16

## 2013-08-26 MED ORDER — FENTANYL 25 MCG/HR TD PT72: 25.0000 ug | MEDICATED_PATCH | TRANSDERMAL | Status: DC

## 2013-08-26 MED ORDER — OXYCODONE HCL 5 MG PO TABS: 5.0000 mg | ORAL_TABLET | Freq: Every day | ORAL | Status: DC

## 2013-08-26 NOTE — Progress Notes (Signed)
Subjective:    Patient ID: Christine Bradshaw, female    DOB: 18-Jun-1965, 48 y.o.   MRN: [id number]  HPI: [name] is a 48 year old female who returns for follow up for chronic pain and medication refill. She says her pain is located in her lower back left side and radiates laterally into her left leg. She rates her pain 7. Her current exercise regime is performing stretching exercises and walking.  She says the last 21/2-3 weeks her activity level has decreased due to increase intensity of low back pain. She says the MBB really helps her pain. She will not be able to be scheduled until October on the Left and Right in November. She verbalizes understanding.  Pain Inventory Average Pain 7 Pain Right Now 7 My pain is sharp, burning and stabbing  In the last 24 hours, has pain interfered with the following? General activity 2 Relation with others 2 Enjoyment of life 2 What TIME of day is your pain at its worst? all Sleep (in general) Poor  Pain is worse with: walking, sitting, inactivity, standing and some activites Pain improves with: rest, heat/ice, medication and injections Relief from Meds: 4  Mobility use a walker how many minutes can you walk? 5 ability to climb steps?  yes do you drive?  no  Function I need assistance with the following:  bathing, meal prep, household duties and shopping  Neuro/Psych tingling spasms depression anxiety  Prior Studies Any changes since last visit?  no  Physicians involved in your care Any changes since last visit?  no   Family History  Problem Relation Age of Onset  . Hyperlipidemia Father   . Hypertension Father   . Heart disease Father   . Colon polyps Neg Hx    History   Social History  . Marital Status: Married    Spouse Name: N/A    Number of Children: N/A  . Years of Education: N/A   Occupational History  . disability    Social History Main Topics  . Smoking status: Current Every Day Smoker --  1.50 packs/day    Types: Cigarettes  . Smokeless tobacco: Never Used  . Alcohol Use: No  . Drug Use: No  . Sexual Activity: Yes    Partners: Male   Other Topics Concern  . None   Social History Narrative   Husband on disability   Pt also on disability   Past Surgical History  Procedure Laterality Date  . Tonsillectomy    . Cholecystectomy    . Appendectomy    . Esophagogastroduodenoscopy  2010    showed erosions  . Colonoscopy w/ biopsies  2010    adenomatous polyp repeat 2015  . Lumbar fusion  2011    L4-L5-S1 Dr. Marcial Pacas  . Abdominal hysterectomy    . Colonoscopy with propofol N/A 03/13/2013    Procedure: COLONOSCOPY WITH PROPOFOL;  Surgeon: Lafayette Dragon, MD;  Location: [facility];  Service: Endoscopy;  Laterality: N/A;   Past Medical History  Diagnosis Date  . Hypertension   . Depression   . Irritable bowel syndrome   . Diabetes mellitus type II   . Panic attack as reaction to stress   . Insomnia     03-08-13"states is in control"  . Tremor   . Memory loss of unknown cause   . Back pain, chronic   . HX OF GALLSTONE 04/21/2008    Qualifier: Diagnosis of  By: Julaine Hua CMA ([place]), Estill Bamberg    .  HEADACHE, CHRONIC 04/21/2008  . Complication of anesthesia     manic episode  . GERD (gastroesophageal reflux disease)   . H/O hiatal hernia   . Arthritis    BP 131/82  Pulse 105  Resp 14  Wt 280 lb (127.007 kg)  SpO2 97%  Opioid Risk Score:   Fall Risk Score: Moderate Fall Risk (6-13 points) (previoulsy educated and handout declined)   Review of Systems  Musculoskeletal:       Spasms  Neurological:       Tingling  Psychiatric/Behavioral: Positive for dysphoric mood. The patient is nervous/anxious.   All other systems reviewed and are negative.      Objective:   Physical Exam  Nursing note and vitals reviewed. Constitutional: She is oriented to person, place, and time. She appears well-developed and well-nourished.  HENT:  Head: Normocephalic and atraumatic.   Neck: Normal range of motion. Neck supple.  Cardiovascular: Normal rate, regular rhythm and normal heart sounds.   Pulmonary/Chest: Effort normal and breath sounds normal.  Musculoskeletal:  Normal Muscle Bulk and Muscle Testing Reveals: Upper Extremities: Full ROM and Muscle strength 5/5 Thoracic Paraspinal Tenderness: T-11- T-12 Lumbar Paraspinal Tenderness: L-3- L-5 Lower Extremities: Full ROM and Muscle Strength 5/5 Arises from chair with ease Narrow Based Gait  Neurological: She is alert and oriented to person, place, and time.  Skin: Skin is warm and dry.  Psychiatric: She has a normal mood and affect.          Assessment & Plan:  1. Lumbar postlaminectomy syndrome- status post L4-5 and L5-S1 fusion approximately 3-1/2 years ago with chronic postoperative pain as well as a chronic left L5 radiculopathy. Refilled: Fentanyl Patch 25 mcg one patch every three days #10 and Oxycodone 5 mg daily as needed #30.  2. Lumbar spondylosis above the level of the fusion which has responded well to radio frequency ablation: Scheduled for RF Left in October and Right in November with Dr. Letta Pate. Continue to Monitor. Continue with Exercise regime.   20 minutes of face to face patient care time was spent during this visit. All questions was encouraged and answered.   F/U in 1 month.

## 2013-09-03 ENCOUNTER — Other Ambulatory Visit: Payer: Self-pay | Admitting: *Deleted

## 2013-09-03 MED ORDER — OMEGA-3-ACID ETHYL ESTERS 1 G PO CAPS: 2.0000 g | ORAL_CAPSULE | Freq: Every day | ORAL | Status: DC

## 2013-09-04 NOTE — Telephone Encounter (Signed)
Pt informed and agreeable.  Appt made for 10/01/2013. Christine Bradshaw, Christine Bradshaw

## 2013-09-24 ENCOUNTER — Encounter
Payer: No Typology Code available for payment source | Attending: Physical Medicine & Rehabilitation | Admitting: Registered Nurse

## 2013-09-24 ENCOUNTER — Encounter: Payer: Self-pay | Admitting: Registered Nurse

## 2013-09-24 VITALS — BP 132/86 | HR 92 | Resp 16 | Ht 65.0 in | Wt 280.4 lb

## 2013-09-24 DIAGNOSIS — M549 Dorsalgia, unspecified: Secondary | ICD-10-CM

## 2013-09-24 DIAGNOSIS — G8928 Other chronic postprocedural pain: Secondary | ICD-10-CM | POA: Insufficient documentation

## 2013-09-24 DIAGNOSIS — Z5181 Encounter for therapeutic drug level monitoring: Secondary | ICD-10-CM

## 2013-09-24 DIAGNOSIS — Z79899 Other long term (current) drug therapy: Secondary | ICD-10-CM

## 2013-09-24 DIAGNOSIS — G8929 Other chronic pain: Secondary | ICD-10-CM

## 2013-09-24 DIAGNOSIS — M961 Postlaminectomy syndrome, not elsewhere classified: Secondary | ICD-10-CM

## 2013-09-24 DIAGNOSIS — M47817 Spondylosis without myelopathy or radiculopathy, lumbosacral region: Secondary | ICD-10-CM

## 2013-09-24 DIAGNOSIS — IMO0002 Reserved for concepts with insufficient information to code with codable children: Secondary | ICD-10-CM | POA: Insufficient documentation

## 2013-09-24 MED ORDER — FENTANYL 25 MCG/HR TD PT72: 25.0000 ug | MEDICATED_PATCH | TRANSDERMAL | Status: DC

## 2013-09-24 MED ORDER — OXYCODONE HCL 5 MG PO TABS: 5.0000 mg | ORAL_TABLET | Freq: Every day | ORAL | Status: DC

## 2013-09-24 NOTE — Progress Notes (Signed)
Subjective:    Patient ID: Christine Bradshaw, female    DOB: 1965/05/27, 48 y.o.   MRN: [id number]  HPI: [name] is a 48 year old female who returns for follow up for chronic pain and medication refill. She says her pain is located in her lower back andradiates laterally into her left leg. She rates her pain 8. Her current exercise regime is performing stretching exercises and walking.   She is scheduled for MBB in October on the Left and Right in November. She is looking forward to treatment she states" it really helps her pain".  Pain Inventory Average Pain 8 Pain Right Now 8 My pain is constant, sharp, burning and aching  In the last 24 hours, has pain interfered with the following? General activity 7 Relation with others 6 Enjoyment of life 7 What TIME of day is your pain at its worst? morning, daytime Sleep (in general) Fair  Pain is worse with: walking, standing and some activites Pain improves with: rest, medication and TENS Relief from Meds: 4  Mobility use a walker  Function disabled: date disabled .  Neuro/Psych trouble walking spasms depression anxiety  Prior Studies Any changes since last visit?  no  Physicians involved in your care Any changes since last visit?  no   Family History  Problem Relation Age of Onset  . Hyperlipidemia Father   . Hypertension Father   . Heart disease Father   . Colon polyps Neg Hx    History   Social History  . Marital Status: Married    Spouse Name: N/A    Number of Children: N/A  . Years of Education: N/A   Occupational History  . disability    Social History Main Topics  . Smoking status: Current Every Day Smoker -- 1.50 packs/day    Types: Cigarettes  . Smokeless tobacco: Never Used  . Alcohol Use: No  . Drug Use: No  . Sexual Activity: Yes    Partners: Male   Other Topics Concern  . None   Social History Narrative   Husband on disability   Pt also on disability   Past Surgical  History  Procedure Laterality Date  . Tonsillectomy    . Cholecystectomy    . Appendectomy    . Esophagogastroduodenoscopy  2010    showed erosions  . Colonoscopy w/ biopsies  2010    adenomatous polyp repeat 2015  . Lumbar fusion  2011    L4-L5-S1 Dr. Marcial Pacas  . Abdominal hysterectomy    . Colonoscopy with propofol N/A 03/13/2013    Procedure: COLONOSCOPY WITH PROPOFOL;  Surgeon: Lafayette Dragon, MD;  Location: [facility];  Service: Endoscopy;  Laterality: N/A;   Past Medical History  Diagnosis Date  . Hypertension   . Depression   . Irritable bowel syndrome   . Diabetes mellitus type II   . Panic attack as reaction to stress   . Insomnia     03-08-13"states is in control"  . Tremor   . Memory loss of unknown cause   . Back pain, chronic   . HX OF GALLSTONE 04/21/2008    Qualifier: Diagnosis of  By: Julaine Hua CMA ([place]), Estill Bamberg    . HEADACHE, CHRONIC 04/21/2008  . Complication of anesthesia     manic episode  . GERD (gastroesophageal reflux disease)   . H/O hiatal hernia   . Arthritis    BP 132/86  Pulse 92  Resp 16  Ht 5\' 5"  (1.651  m)  Wt 280 lb 6.4 oz (127.189 kg)  BMI 46.66 kg/m2  SpO2 98%  Opioid Risk Score:   Fall Risk Score: Low Fall Risk (0-5 points)   Review of Systems     Objective:   Physical Exam  Nursing note and vitals reviewed. Constitutional: She is oriented to person, place, and time. She appears well-developed and well-nourished.  HENT:  Head: Normocephalic and atraumatic.  Neck: Normal range of motion.  Cardiovascular: Normal rate and regular rhythm.   Pulmonary/Chest: Effort normal and breath sounds normal.  Musculoskeletal:  Normal Muscle Bulk and Muscle Testing Reveals: Upper Extremities: Full ROM and Muscle Strength 5/5   Neurological: She is alert and oriented to person, place, and time.  Skin: Skin is warm and dry.  Psychiatric: She has a normal mood and affect.          Assessment & Plan:  1. Lumbar postlaminectomy syndrome-  status post L4-5 and L5-S1 fusion approximately 3-1/2 years ago with chronic postoperative pain as well as a chronic left L5 radiculopathy. Refilled: Fentanyl Patch 25 mcg one patch every three days #10 and Oxycodone 5 mg daily as needed #30.  2. Lumbar spondylosis above the level of the fusion which has responded well to radio frequency ablation: Scheduled for RF Left in October and Right in November with Dr. Letta Pate. Continue to Monitor. Continue with Exercise regime.  20 minutes of face to face patient care time was spent during this visit. All questions was encouraged and answered.  F/U in 1 month.

## 2013-10-01 ENCOUNTER — Encounter: Payer: Self-pay | Admitting: Family Medicine

## 2013-10-01 ENCOUNTER — Ambulatory Visit (INDEPENDENT_AMBULATORY_CARE_PROVIDER_SITE_OTHER): Payer: No Typology Code available for payment source | Admitting: Family Medicine

## 2013-10-01 VITALS — BP 113/87 | HR 113 | Temp 97.9°F | Ht 65.0 in | Wt 276.0 lb

## 2013-10-01 DIAGNOSIS — E119 Type 2 diabetes mellitus without complications: Secondary | ICD-10-CM

## 2013-10-01 DIAGNOSIS — F17200 Nicotine dependence, unspecified, uncomplicated: Secondary | ICD-10-CM

## 2013-10-01 DIAGNOSIS — Z794 Long term (current) use of insulin: Secondary | ICD-10-CM

## 2013-10-01 DIAGNOSIS — E785 Hyperlipidemia, unspecified: Secondary | ICD-10-CM

## 2013-10-01 DIAGNOSIS — Z720 Tobacco use: Secondary | ICD-10-CM

## 2013-10-01 DIAGNOSIS — Z23 Encounter for immunization: Secondary | ICD-10-CM

## 2013-10-01 DIAGNOSIS — Z0000 Encounter for general adult medical examination without abnormal findings: Secondary | ICD-10-CM

## 2013-10-01 LAB — POCT GLYCOSYLATED HEMOGLOBIN (HGB A1C): Hemoglobin A1C: 5.5

## 2013-10-01 NOTE — Progress Notes (Signed)
Patient ID: Christine Bradshaw, female   DOB: 01/09/1965, 48 y.o.   MRN: [id number]     Christine Bradshaw is a 48 y.o. female who presents to the [facility] today with to meet new PCP and discuss HTN/DM. Her concerns today include:  HPI:  Hypertension BP Readings from Last 3 Encounters:  10/01/13 113/87  09/24/13 132/86  08/26/13 131/82   Home BP monitoring-Yes usually in 110-130/80-90 range Compliant with medications-yes without side effects ROS-Denies any CP, HA, SOB, blurry vision, LE edema, transient weakness, orthopnea, PND.   DIABETES Type II Medications: Reports taking and tolerating without side effects. Blood Sugars per patient: Fasting: 90, High: 127, Low: 62 Diet-Eating heathy foods, low carbs and fat Regular Exercise-Morning stretching On Aspirin-yes On statin-yes Daily foot monitoring-yes Last eye exam: 1 year ago Last foot exam: 11/14  Has headaches, shakiness, diaphoresis with low blood sugars. Has occurred 3-4 times since last visit. Usually happen when patient goes a long time without eating.   ROS- Denies Polyuria,Polydipsia, nocturia, Vision changes, feet or hand numbness/pain/tingling.   Hemoglobin a1c:  Lab Results  Component Value Date   HGBA1C 5.5 04/22/2013   HGBA1C 5.5 09/24/2012   HGBA1C 5.9 05/07/2012   Hyperlipidemia On statin: Yes Regular exercise: Does stretches daily, exercise limited by chronic low back pain Diet: Reports eating a healthy diet low in carbs and fays ROS- no chest pain or shortness of breath. No myalgias   Tobacco Abuse Smokes 1.5ppd. Currently thinking about quitting smoking. Has quit on 2 separate occasions in the past for several months while pregnant.   Health Maintenance: UTD on mammogram and colonoscopy. Flu shot given today.  ROS: As per HPI, otherwise all systems reviewed and are negative.  Past Medical History - Reviewed and updated Patient Active Problem List   Diagnosis Date Noted  . Healthcare maintenance  10/01/2013  . Special screening for malignant neoplasms, colon 03/13/2013  . Dental abscess 11/16/2012  . Tobacco abuse 04/25/2011  . Skin lesion of left leg 04/21/2011  . Venous insufficiency 03/11/2010  . Diabetic neuropathy, painful 03/10/2010  . BIPOLAR DISORDER UNSPECIFIED 06/05/2009  . BACK PAIN, CHRONIC 06/05/2009  . Obesity, morbid, BMI 50 or higher 04/21/2008  . ANXIETY 04/21/2008  . HYPERTENSION 04/21/2008  . Diabetes mellitus, type II, insulin dependent 04/17/2008  . HYPERLIPIDEMIA 04/17/2008  . Irritable bowel syndrome 04/17/2008  . COLONIC POLYPS, HYPERPLASTIC, HX OF 04/17/2008    Medications- reviewed and updated Current Outpatient Prescriptions  Medication Sig Dispense Refill  . aspirin EC 81 MG tablet Take 81 mg by mouth every morning.      . Blood Glucose Monitoring Suppl (PRODIGY AUTOCODE BLOOD GLUCOSE) W/DEVICE KIT by Does not apply route.        . clonazePAM (KLONOPIN) 1 MG tablet Take 1 mg by mouth 3 (three) times daily.      Marland Kitchen desloratadine (CLARINEX) 5 MG tablet Take 1 tablet (5 mg total) by mouth daily.  90 tablet  3  . DULoxetine (CYMBALTA) 60 MG capsule Take 120 mg by mouth every morning.      Marland Kitchen esomeprazole (NEXIUM) 40 MG capsule Take 40 mg by mouth daily at 12 noon.      . fentaNYL (DURAGESIC - DOSED MCG/HR) 25 MCG/HR patch Place 1 patch (25 mcg total) onto the skin every 3 (three) days.  10 patch  0  . furosemide (LASIX) 40 MG tablet Take 1 tablet (40 mg total) by mouth daily as needed for fluid.  30 tablet  1  . glucose blood (PRODIGY TEST) test strip 1 each by Other route as needed. Test 4 times a day       . Hyprom-Naphaz-Polysorb-Zn Sulf (CLEAR EYES COMPLETE) SOLN Apply 1 drop to eye 3 (three) times daily as needed (dry eyes).      Marland Kitchen ibuprofen (ADVIL,MOTRIN) 200 MG tablet Take 400 mg by mouth every 6 (six) hours as needed.      . insulin aspart (NOVOLOG) 100 UNIT/ML injection Inject into the skin 3 (three) times daily as needed for high blood sugar. Pt  is on a sliding scale.      . insulin glargine (LANTUS) 100 UNIT/ML injection Inject 0.7 mLs (70 Units total) into the skin daily.  10 mL  prn  . INSULIN SYRINGE .5CC/29G (RELION INSULIN SYR 0.5CC/29G) 29G X 1/2" 0.5 ML MISC Use for insulin administration QID  100 each  11  . Insulin Syringe-Needle U-100 (INSULIN SYRINGE 1CC/28G) 28G X 1/2" 1 ML MISC 1 Device by Does not apply route 4 (four) times daily. Pt requires syringe for >50 units of insulin  200 each  11  . lamoTRIgine (LAMICTAL) 100 MG tablet Take 100 mg by mouth 2 (two) times daily.        . Lancets MISC by Does not apply route. Four times a day       . metFORMIN (GLUCOPHAGE) 1000 MG tablet Take 1 tablet (1,000 mg total) by mouth 2 (two) times daily with a meal.  180 tablet  4  . methocarbamol (ROBAXIN) 500 MG tablet Take 1 tablet (500 mg total) by mouth every 8 (eight) hours as needed.  90 tablet  2  . omega-3 acid ethyl esters (LOVAZA) 1 G capsule Take 2 capsules (2 g total) by mouth daily.  30 capsule  5  . oxyCODONE (OXY IR/ROXICODONE) 5 MG immediate release tablet Take 1 tablet (5 mg total) by mouth daily. May take twice a day when pain is severe  40 tablet  0  . pravastatin (PRAVACHOL) 40 MG tablet Take 40 mg by mouth every evening.      . quinapril (ACCUPRIL) 20 MG tablet Take 1 tablet (20 mg total) by mouth at bedtime.  30 tablet  2  . risperiDONE (RISPERDAL) 0.25 MG tablet Take 0.25 mg by mouth 3 (three) times daily.      . risperiDONE (RISPERDAL) 2 MG tablet Take 2 mg by mouth at bedtime.      . traZODone (DESYREL) 100 MG tablet Take 100 mg by mouth at bedtime.        . diclofenac (VOLTAREN) 75 MG EC tablet TAKE ONE TABLET BY MOUTH TWICE DAILY AS NEEDED  60 tablet  0   No current facility-administered medications for this visit.    Objective: Physical Exam: BP 113/87  Pulse 113  Temp(Src) 97.9 F (36.6 C) (Oral)  Ht _0  (1.651 m)  Wt 276 lb (125.193 kg)  BMI 45.93 kg/m2  Gen: NAD, resting comfortably CV: RRR with  no murmurs appreciated Lungs: NWOB, CTAB with no crackles, wheezes, or rhonchi Abdomen: Normal bowel sounds present. Soft, Nontender, Nondistended. Ext: no edema Skin: warm, dry Neuro: grossly normal, moves all extremities  No results found for this or any previous visit (from the past 72 hour(s)).  A/P: See problem list  Diabetes mellitus, type II, insulin dependent A: Well controlled based on home CBG monitoring. Previous A1C all <6. Only a couple mild hypoglycemic episodes with prolonged fasting. Up to date on ophthamologic exam,  foot exam, and pneumovaccine.   P: Will continue regimen of metformin, lantus, and SSI novolog for now. Will obtain A1C today.   HYPERLIPIDEMIA A: Tolerating statin without side effects, reports eating healthy diet and regular exercise P: Continue pravastatin. Will obtain lipid panel prior to next visit.   Tobacco abuse Counseled on cessation today. Recommended patches, but patient was not sure if they were covered by MAP. Recommended referral to tobacco cessation clinic here at the [facility], but patient declined. Also gave 1-800-QUIT-NOW hotline number.  Healthcare maintenance UTD on mammogram and colonoscopy. Both were reportedly without evidence concerning for malignancy.    Orders Placed This Encounter  Procedures  . Lipid Panel    Standing Status: Future     Number of Occurrences:      Standing Expiration Date: 10/02/2014  . CBC    Standing Status: Future     Number of Occurrences:      Standing Expiration Date: 10/02/2014  . Basic Metabolic Panel    Standing Status: Future     Number of Occurrences:      Standing Expiration Date: 10/02/2014  . POCT HgB A1C    No orders of the defined types were placed in this encounter.     Algis Greenhouse. Jerline Pain, [place] Resident PGY-1 10/01/2013 4:27 PM

## 2013-10-01 NOTE — Assessment & Plan Note (Signed)
A: Well controlled based on home CBG monitoring. Previous A1C all <6. Only a couple mild hypoglycemic episodes with prolonged fasting. Up to date on ophthamologic exam, foot exam, and pneumovaccine.   P: Will continue regimen of metformin, lantus, and SSI novolog for now. Will obtain A1C today.

## 2013-10-01 NOTE — Patient Instructions (Signed)
Thank you for coming to the clinic today. It was nice seeing you.  For your blood pressure, you well controlled. Continue taking the accupril.  For your diabetes, continue taking the metformin, lantus, novlog. Continue to watch for lows for signs of low blood sugar such as shakiness or sweating.  For your smoking, we discussed starting patches, but aren't sure if the MAP covers ut. If you want you can schedule an appoint here in this clinic with the pharmacists for more help. You can also call 1-800-Quit-Now for further resources.  Before your next visit we will be drawing some blood tests.   See you again in 3 months.

## 2013-10-01 NOTE — Assessment & Plan Note (Addendum)
A: Tolerating statin without side effects, reports eating healthy diet and regular exercise P: Continue pravastatin. Will obtain lipid panel prior to next visit.

## 2013-10-01 NOTE — Assessment & Plan Note (Signed)
UTD on mammogram and colonoscopy. Both were reportedly without evidence concerning for malignancy.

## 2013-10-01 NOTE — Assessment & Plan Note (Signed)
Counseled on cessation today. Recommended patches, but patient was not sure if they were covered by MAP. Recommended referral to tobacco cessation clinic here at the [facility], but patient declined. Also gave 1-800-QUIT-NOW hotline number.

## 2013-10-04 ENCOUNTER — Other Ambulatory Visit: Payer: Self-pay | Admitting: Family Medicine

## 2013-10-04 NOTE — Telephone Encounter (Signed)
Pt called and said that the MAP program has been faxing [place] a refill request for Lovaza since the beginning of September. Can this be faxed over. Please let patient know. [email or web address]

## 2013-10-04 NOTE — Telephone Encounter (Signed)
Will forward to MD. Jazmin Hartsell,CMA  

## 2013-10-07 MED ORDER — OMEGA-3-ACID ETHYL ESTERS 1 G PO CAPS: 2.0000 g | ORAL_CAPSULE | Freq: Every day | ORAL | Status: DC

## 2013-10-07 NOTE — Telephone Encounter (Signed)
Prescription printed, signed, and will be faxed to MAP.  Algis Greenhouse. Jerline Pain, [place] Resident PGY-1 10/07/2013 4:34 PM

## 2013-10-07 NOTE — Telephone Encounter (Signed)
LMOVM informing pt of below. Dondre Catalfamo, [name]

## 2013-10-08 ENCOUNTER — Telehealth: Payer: Self-pay | Admitting: Family Medicine

## 2013-10-08 NOTE — Telephone Encounter (Signed)
This should have been faxed yesterday. Please let me know if it needs to be resent. Thanks!   Algis Greenhouse. Jerline Pain, [place] [facility] Resident PGY-1 10/08/2013 5:31 PM

## 2013-10-08 NOTE — Telephone Encounter (Signed)
Pt called about her lovaza RX. Please fax to MAP when ready. Please let pt know when this is done

## 2013-10-10 ENCOUNTER — Telehealth: Payer: Self-pay | Admitting: *Deleted

## 2013-10-10 NOTE — Telephone Encounter (Signed)
Dawn from [facility] called stating that the Rx for Lovaza direction is 2 caps daily.  It was 2 caps BID.  Please give them a call to clarify the directions at [phone].  Derl Barrow, RN

## 2013-10-11 ENCOUNTER — Other Ambulatory Visit: Payer: Self-pay | Admitting: *Deleted

## 2013-10-11 NOTE — Telephone Encounter (Signed)
LM for dawn at MAP with clarified instructions. Jazmin Hartsell,CMA

## 2013-10-14 MED ORDER — DESLORATADINE 5 MG PO TABS: 5.0000 mg | ORAL_TABLET | Freq: Every day | ORAL | Status: DC

## 2013-10-14 NOTE — Telephone Encounter (Signed)
Clarinex refilled.  Algis Greenhouse. Jerline Pain, [place] Resident PGY-1 10/14/2013 5:10 PM

## 2013-10-15 ENCOUNTER — Encounter: Payer: Self-pay | Admitting: Physical Medicine & Rehabilitation

## 2013-10-15 ENCOUNTER — Encounter: Payer: No Typology Code available for payment source | Attending: Physical Medicine & Rehabilitation

## 2013-10-15 ENCOUNTER — Ambulatory Visit (HOSPITAL_BASED_OUTPATIENT_CLINIC_OR_DEPARTMENT_OTHER): Payer: No Typology Code available for payment source | Admitting: Physical Medicine & Rehabilitation

## 2013-10-15 VITALS — BP 129/77 | HR 79 | Resp 14 | Wt 279.0 lb

## 2013-10-15 DIAGNOSIS — M47816 Spondylosis without myelopathy or radiculopathy, lumbar region: Secondary | ICD-10-CM

## 2013-10-15 MED ORDER — OXYCODONE HCL 5 MG PO TABS: 5.0000 mg | ORAL_TABLET | Freq: Every day | ORAL | Status: DC

## 2013-10-15 MED ORDER — GABAPENTIN 600 MG PO TABS
600.0000 mg | ORAL_TABLET | Freq: Three times a day (TID) | ORAL | Status: DC
Start: 2013-10-15 — End: 2014-01-09

## 2013-10-15 MED ORDER — FENTANYL 25 MCG/HR TD PT72: 25.0000 ug | MEDICATED_PATCH | TRANSDERMAL | Status: DC

## 2013-10-15 NOTE — Progress Notes (Signed)
Left T12,L1,L2 medial branch radio frequency neurotomy under fluoroscopic guidance   Indication: Low back pain due to lumbar spondylosis which has been relieved on 2 occasions by greater than 50% by lumbar medial branch blocks at corresponding levels.  Informed consent was obtained after describing risks and benefits of the procedure with the patient, this includes bleeding, bruising, infection, paralysis and medication side effects. The patient wishes to proceed and has given written consent. The patient was placed in a prone position. The lumbar and sacral area was marked and prepped with Betadine. A 25-gauge 1-1/2 inch needle was inserted into the skin and subcutaneous tissue at 3 sites in one ML of 1% lidocaine was injected into each site. Then a 20-gauge 10cm cm radio frequency needle with a 1 cm curved active tip was inserted targeting the Left L3 SAP/transverse process junction. Bone contact was made and confirmed with lateral imaging. Sensory stimulation at 50 Hz followed by motor stimulation at 2 Hz confirm proper needle location followed by injection of one ML of the solution containing one ML of 4 mg per mL dexamethasone and 3 mL of 1% MPF lidocaine. Then the Left L2 SAP/transverse process junction was targeted. Bone contact was made and confirmed with lateral imaging. Sensory stimulation at 50 Hz followed by motor stimulation at 2 Hz confirm proper needle location followed by injection of one ML of the solution containing one ML of 4 mg per mL dexamethasone and 3 mL of 1% MPF lidocaine. Then the Left L1 SAP/transverse process junction was targeted. Bone contact was made and confirmed with lateral imaging. Sensory stimulation at 50 Hz followed by motor stimulation at 2 Hz confirm proper needle location followed by injection of one ML of the solution containing one ML of 4 mg per mL dexamethasone and 3 mL of 1% MPF lidocaine. Radio frequency lesion being at 80C for 90 seconds was performed. Needles  were removed. Post procedure instructions and vital signs were performed. Patient tolerated procedure well. Followup appointment was given. 

## 2013-10-15 NOTE — Patient Instructions (Addendum)

## 2013-10-15 NOTE — Progress Notes (Signed)
  PROCEDURE RECORD [facility]   Name: Christine Bradshaw DOB:[birth year] MRN: [id number]  Date:10/15/2013  Physician: Alysia Penna, MD    Nurse/CMA: Lorriane Shire Allergies:  Allergies  Allergen Reactions  . Abilify [Aripiprazole]     Shortness of breath, cramps, shakes   . Lithium     Kidneys stop    Consent Signed: Yes.    Is patient diabetic? Yes.    CBG today? 55  Pregnant: No. LMP: No LMP recorded. Patient has had a hysterectomy. (age [age])  Anticoagulants: no Anti-inflammatory: no Antibiotics: no  Procedure: left radiofrequency neurotomy Position: Prone Start Time: 2:29 End Time: 2:42 Fluoro Time: 28 seconds  RN/CMA Health and safety inspector RN    Time 13:42     BP 129/77     Pulse 79     Respirations 14     O2 Sat 96     S/S 6     Pain Level 8/10      D/C home with Herbie Baltimore, [name] & O X 3, D/C instructions reviewed, and sits independently.

## 2013-10-16 ENCOUNTER — Telehealth: Payer: Self-pay | Admitting: *Deleted

## 2013-10-16 NOTE — Telephone Encounter (Signed)
Patient called stating that yesterday 10/15/13 she had increased Hip & leg pain and numbness.  Patient said that she was feeling a little better today after resting last night.  Is this normal - just wanted to lets [place] know.

## 2013-10-16 NOTE — Telephone Encounter (Signed)
Likely effect of numbing medicine should improve

## 2013-10-25 ENCOUNTER — Other Ambulatory Visit: Payer: Self-pay | Admitting: Family Medicine

## 2013-10-28 ENCOUNTER — Other Ambulatory Visit: Payer: Self-pay | Admitting: *Deleted

## 2013-10-30 MED ORDER — INSULIN ASPART 100 UNIT/ML ~~LOC~~ SOLN: SUBCUTANEOUS | Status: DC

## 2013-10-30 NOTE — Telephone Encounter (Signed)
Refill faxed to MAP.   Algis Greenhouse. Jerline Pain, [place] Resident PGY-1 10/30/2013 8:59 PM

## 2013-11-04 ENCOUNTER — Other Ambulatory Visit: Payer: Self-pay | Admitting: *Deleted

## 2013-11-04 DIAGNOSIS — I10 Essential (primary) hypertension: Secondary | ICD-10-CM

## 2013-11-04 MED ORDER — QUINAPRIL HCL 20 MG PO TABS: 20.0000 mg | ORAL_TABLET | Freq: Every day | ORAL | Status: DC

## 2013-11-04 NOTE — Telephone Encounter (Signed)
Quinapril refilled. Will check BMP and lipids prior to next visit.  Algis Greenhouse. Jerline Pain, [place] Resident PGY-1 11/04/2013 5:24 PM

## 2013-11-05 NOTE — Telephone Encounter (Signed)
Pt informed and appts made. Christine Bradshaw, Christine Bradshaw

## 2013-11-14 ENCOUNTER — Encounter: Payer: No Typology Code available for payment source | Attending: Physical Medicine & Rehabilitation

## 2013-11-14 ENCOUNTER — Ambulatory Visit (HOSPITAL_BASED_OUTPATIENT_CLINIC_OR_DEPARTMENT_OTHER): Payer: No Typology Code available for payment source | Admitting: Physical Medicine & Rehabilitation

## 2013-11-14 ENCOUNTER — Encounter: Payer: Self-pay | Admitting: Physical Medicine & Rehabilitation

## 2013-11-14 ENCOUNTER — Telehealth: Payer: Self-pay | Admitting: Family Medicine

## 2013-11-14 VITALS — BP 119/69 | HR 110 | Resp 14 | Ht 65.0 in | Wt 268.0 lb

## 2013-11-14 DIAGNOSIS — M47816 Spondylosis without myelopathy or radiculopathy, lumbar region: Secondary | ICD-10-CM | POA: Insufficient documentation

## 2013-11-14 NOTE — Progress Notes (Signed)
  PROCEDURE RECORD [facility]   Name: Christine Bradshaw DOB:[birth year] MRN: [id number]  Date:11/14/2013  Physician: Alysia Penna, MD    Nurse/CMA: Mumin Denomme  Allergies:  Allergies  Allergen Reactions  . Abilify [Aripiprazole]     Shortness of breath, cramps, shakes   . Lithium     Kidneys stop    Consent Signed: Yes.    Is patient diabetic? Yes.    CBG today? 95  Pregnant: No. LMP: No LMP recorded. Patient has had a hysterectomy. (age [age])  Anticoagulants: no Anti-inflammatory: no Antibiotics: no  Procedure:  Radiofrequency Neurotomy  right T12 L1-2  Position: Prone Start Time:  11:18 End Time: 11:37  Fluoro Time:43 seconds   RN/CMA Aiza Vollrath Shumaker RNl    Time 10:30am 11:39    BP 153/84 128/80    Pulse 70 103    Respirations 14 14    O2 Sat 95 96    S/S 6 6    Pain Level 5/10  /10     D/C home with husband, [name] & O X 3, D/C instructions reviewed, and sits independently.

## 2013-11-14 NOTE — Telephone Encounter (Signed)
Pt called because the MAP program is stating that we never have responded to their request for Accupril. I seen where we did a print on 11/04/13. Can we resend this again since the patient is almost out. Christine Bradshaw

## 2013-11-14 NOTE — Progress Notes (Signed)
Right T12,L1,L2 medial branch radio frequency neuropathy under fluoroscopic guidance   Indication: Low back pain due to lumbar spondylosis which has been relieved on 2 occasions by greater than 50% by lumbar medial branch blocks at corresponding levels.  Informed consent was obtained after describing risks and benefits of the procedure with the patient, this includes bleeding, bruising, infection, paralysis and medication side effects. The patient wishes to proceed and has given written consent. The patient was placed in a prone position. The lumbar and sacral area was marked and prepped with Betadine. A 25-gauge 1-1/2 inch needle was inserted into the skin and subcutaneous tissue at 3 sites in one ML of 1% lidocaine was injected into each site. Then a 20-gauge 15cm cm radio frequency needle with a 1 cm curved active tip was inserted targeting the Right L3 SAP/transverse process junction. Bone contact was made and confirmed with lateral imaging. Sensory stimulation at 50 Hz followed by motor stimulation at 2 Hz confirm proper needle location followed by injection of one ML of the solution containing one ML of 4 mg per mL dexamethasone and 3 mL of 1% MPF lidocaine. Then the Right L2 SAP/transverse process junction was targeted. Bone contact was made and confirmed with lateral imaging. Sensory stimulation at 50 Hz followed by motor stimulation at 2 Hz confirm proper needle location followed by injection of one ML of the solution containing one ML of 4 mg per mL dexamethasone and 3 mL of 1% MPF lidocaine. Then the Right L1 SAP/transverse process junction was targeted. Bone contact was made and confirmed with lateral imaging. Sensory stimulation at 50 Hz followed by motor stimulation at 2 Hz confirm proper needle location followed by injection of one ML of the solution containing one ML of 4 mg per mL dexamethasone and 3 mL of 1% MPF lidocaine. Radio frequency lesion being at 80C for 90 seconds was performed.  Needles were removed. Post procedure instructions and vital signs were performed. Patient tolerated procedure well. Followup appointment was given. 

## 2013-11-14 NOTE — Patient Instructions (Signed)

## 2013-11-19 NOTE — Telephone Encounter (Signed)
Left voice message on pharmacy line to check status of refill.  Left prescription information on pharmacy line as approved by Dr. Jerline Pain on 11/04/2013.  quinapril (ACCUPRIL) 20 MG tablet: dispense 30 tablets, 2 refills; take 1 tablet at bedtime.  Derl Barrow, RN

## 2013-11-21 ENCOUNTER — Other Ambulatory Visit: Payer: Self-pay | Admitting: *Deleted

## 2013-11-21 MED ORDER — ESOMEPRAZOLE MAGNESIUM 40 MG PO CPDR: 40.0000 mg | DELAYED_RELEASE_CAPSULE | Freq: Every day | ORAL | Status: DC

## 2013-11-21 NOTE — Telephone Encounter (Signed)
Prescription for nexium faxed to MAP.   Algis Greenhouse. Jerline Pain, [place] Resident PGY-1 11/21/2013 2:25 PM

## 2013-11-26 ENCOUNTER — Ambulatory Visit: Payer: No Typology Code available for payment source | Admitting: Physical Medicine & Rehabilitation

## 2013-12-09 ENCOUNTER — Other Ambulatory Visit: Payer: No Typology Code available for payment source

## 2013-12-09 DIAGNOSIS — E785 Hyperlipidemia, unspecified: Secondary | ICD-10-CM

## 2013-12-09 LAB — CBC
HCT: 40.3 % (ref 36.0–46.0)
Hemoglobin: 13.3 g/dL (ref 12.0–15.0)
MCH: 26.8 pg (ref 26.0–34.0)
MCHC: 33 g/dL (ref 30.0–36.0)
MCV: 81.1 fL (ref 78.0–100.0)
MPV: 10.5 fL (ref 9.4–12.4)
PLATELETS: 350 10*3/uL (ref 150–400)
RBC: 4.97 MIL/uL (ref 3.87–5.11)
RDW: 16.6 % — AB (ref 11.5–15.5)
WBC: 9.5 10*3/uL (ref 4.0–10.5)

## 2013-12-09 LAB — LIPID PANEL
CHOL/HDL RATIO: 3.7 ratio
Cholesterol: 110 mg/dL (ref 0–200)
HDL: 30 mg/dL — AB (ref >39)
LDL CALC: 42 mg/dL (ref 0–99)
Triglycerides: 190 mg/dL — ABNORMAL HIGH (ref <150)
VLDL: 38 mg/dL (ref 0–40)

## 2013-12-09 LAB — BASIC METABOLIC PANEL
BUN: 12 mg/dL (ref 6–23)
CALCIUM: 9.4 mg/dL (ref 8.4–10.5)
CO2: 26 mEq/L (ref 19–32)
Chloride: 103 mEq/L (ref 96–112)
Creat: 0.76 mg/dL (ref 0.50–1.10)
GLUCOSE: 119 mg/dL — AB (ref 70–99)
Potassium: 4.2 mEq/L (ref 3.5–5.3)
Sodium: 137 mEq/L (ref 135–145)

## 2013-12-09 NOTE — Progress Notes (Signed)
BMP,CBC,FLP DONE TODAY Toby Ayad

## 2013-12-12 ENCOUNTER — Encounter: Payer: Self-pay | Admitting: Registered Nurse

## 2013-12-12 ENCOUNTER — Encounter
Payer: No Typology Code available for payment source | Attending: Physical Medicine & Rehabilitation | Admitting: Registered Nurse

## 2013-12-12 VITALS — BP 124/86 | HR 93 | Resp 14 | Wt 279.0 lb

## 2013-12-12 DIAGNOSIS — G8929 Other chronic pain: Secondary | ICD-10-CM

## 2013-12-12 DIAGNOSIS — M961 Postlaminectomy syndrome, not elsewhere classified: Secondary | ICD-10-CM

## 2013-12-12 DIAGNOSIS — M47816 Spondylosis without myelopathy or radiculopathy, lumbar region: Secondary | ICD-10-CM | POA: Insufficient documentation

## 2013-12-12 DIAGNOSIS — M47817 Spondylosis without myelopathy or radiculopathy, lumbosacral region: Secondary | ICD-10-CM

## 2013-12-12 DIAGNOSIS — Z5181 Encounter for therapeutic drug level monitoring: Secondary | ICD-10-CM

## 2013-12-12 DIAGNOSIS — Z79899 Other long term (current) drug therapy: Secondary | ICD-10-CM

## 2013-12-12 DIAGNOSIS — M549 Dorsalgia, unspecified: Secondary | ICD-10-CM

## 2013-12-12 MED ORDER — OXYCODONE HCL 5 MG PO TABS: 5.0000 mg | ORAL_TABLET | Freq: Every day | ORAL | Status: DC

## 2013-12-12 MED ORDER — FENTANYL 25 MCG/HR TD PT72: 25.0000 ug | MEDICATED_PATCH | TRANSDERMAL | Status: DC

## 2013-12-12 NOTE — Progress Notes (Signed)
Subjective:    Patient ID: Christine Bradshaw, female    DOB: [birth year], 48 y.o.   MRN: [id number]  HPI: Christine Bradshaw is a 48 year old female who returns for follow up for chronic pain and medication refill. She says her pain is located in her mid-lower back. She rates her pain 4. Her current exercise regime is performing chair exercises and walking.  S/P Right T12- L1- L-2 radiofrequency neurotomy with minimal relief noted at this time.  Pain Inventory Average Pain 7 Pain Right Now 4 My pain is intermittent  In the last 24 hours, has pain interfered with the following? General activity 5 Relation with others 5 Enjoyment of life 6 What TIME of day is your pain at its worst? daytime Sleep (in general) Fair  Pain is worse with: walking, standing and some activites Pain improves with: heat/ice, medication and injections Relief from Meds: 6  Mobility use a walker ability to climb steps?  yes do you drive?  no  Function not employed: date last employed . I need assistance with the following:  meal prep, household duties and shopping  Neuro/Psych spasms depression anxiety  Prior Studies Any changes since last visit?  no  Physicians involved in your care Any changes since last visit?  no   Family History  Problem Relation Age of Onset  . Hyperlipidemia Father   . Hypertension Father   . Heart disease Father   . Colon polyps Neg Hx    History   Social History  . Marital Status: Married    Spouse Name: N/A    Number of Children: N/A  . Years of Education: N/A   Occupational History  . disability    Social History Main Topics  . Smoking status: Current Every Day Smoker -- 1.50 packs/day    Types: Cigarettes  . Smokeless tobacco: Never Used  . Alcohol Use: No  . Drug Use: No  . Sexual Activity:    Partners: Male   Other Topics Concern  . None   Social History Narrative   Husband on disability   Pt also on disability   Past Surgical  History  Procedure Laterality Date  . Tonsillectomy    . Cholecystectomy    . Appendectomy    . Esophagogastroduodenoscopy  2010    showed erosions  . Colonoscopy w/ biopsies  2010    adenomatous polyp repeat 2015  . Lumbar fusion  2011    L4-L5-S1 Dr. Marcial Pacas  . Abdominal hysterectomy    . Colonoscopy with propofol N/A 03/13/2013    Procedure: COLONOSCOPY WITH PROPOFOL;  Surgeon: Lafayette Dragon, MD;  Location: [facility];  Service: Endoscopy;  Laterality: N/A;   Past Medical History  Diagnosis Date  . Hypertension   . Depression   . Irritable bowel syndrome   . Diabetes mellitus type II   . Panic attack as reaction to stress   . Insomnia     03-08-13"states is in control"  . Tremor   . Memory loss of unknown cause   . Back pain, chronic   . HX OF GALLSTONE 04/21/2008    Qualifier: Diagnosis of  By: Julaine Hua CMA ([place]), Estill Bamberg    . HEADACHE, CHRONIC 04/21/2008  . Complication of anesthesia     manic episode  . GERD (gastroesophageal reflux disease)   . H/O hiatal hernia   . Arthritis    BP 124/86 mmHg  Pulse 93  Resp 14  Wt 279 lb (126.554 kg)  SpO2 96%  Opioid Risk Score:   Fall Risk Score:  (previously educated and given handout)  Review of Systems  Musculoskeletal: Positive for back pain.       Spasms  Psychiatric/Behavioral: Positive for dysphoric mood. The patient is nervous/anxious.   All other systems reviewed and are negative.      Objective:   Physical Exam  Constitutional: She is oriented to person, place, and time. She appears well-developed and well-nourished.  HENT:  Head: Normocephalic and atraumatic.  Neck: Normal range of motion. Neck supple.  Cardiovascular: Normal rate and regular rhythm.   Pulmonary/Chest: Effort normal and breath sounds normal.  Musculoskeletal:  Normal Muscle Bulk and Muscle Testing Reveals: Upper Extremities: Full ROM and Muscle strength 5/5 Spinal Forward Flexion 90 Degrees and Extension 20 Degrees Thoracic Paraspinal  Tenderness: T-12 Lumbar Paraspinal Tenderness: L-1-L-2 Lower Extremities: Full ROM and Muscle Strength 5/5 Arises from chair with ease Narrow Based gait  Neurological: She is alert and oriented to person, place, and time.  Skin: Skin is warm and dry.  Psychiatric: She has a normal mood and affect.  Nursing note and vitals reviewed.         Assessment & Plan:  1. Lumbar postlaminectomy syndrome- status post L4-5 and L5-S1 fusion approximately 3-1/2 years ago with chronic postoperative pain as well as a chronic left L5 radiculopathy.  Refilled: Fentanyl Patch 25 mcg one patch every three days #10 and Oxycodone 5 mg daily as needed #40.  2. Lumbar spondylosis above the level of the fusion which has responded well to radio frequency ablation: S/P Right Radiofrequency Neurotomy T12- L-1- L-2 Minimal Relief.  Continue to Monitor. Continue with Exercise regime.  20 minutes of face to face patient care time was spent during this visit. All questions was encouraged and answered.   F/U in 1 month.

## 2013-12-17 ENCOUNTER — Encounter: Payer: Self-pay | Admitting: Family Medicine

## 2013-12-17 ENCOUNTER — Ambulatory Visit (INDEPENDENT_AMBULATORY_CARE_PROVIDER_SITE_OTHER): Payer: No Typology Code available for payment source | Admitting: Family Medicine

## 2013-12-17 VITALS — HR 120 | Temp 98.6°F | Wt 270.0 lb

## 2013-12-17 DIAGNOSIS — Z794 Long term (current) use of insulin: Secondary | ICD-10-CM

## 2013-12-17 DIAGNOSIS — I10 Essential (primary) hypertension: Secondary | ICD-10-CM

## 2013-12-17 DIAGNOSIS — L299 Pruritus, unspecified: Secondary | ICD-10-CM

## 2013-12-17 DIAGNOSIS — E119 Type 2 diabetes mellitus without complications: Secondary | ICD-10-CM

## 2013-12-17 DIAGNOSIS — E785 Hyperlipidemia, unspecified: Secondary | ICD-10-CM

## 2013-12-17 LAB — POCT GLYCOSYLATED HEMOGLOBIN (HGB A1C): HEMOGLOBIN A1C: 5.4

## 2013-12-17 MED ORDER — INSULIN GLARGINE 100 UNIT/ML ~~LOC~~ SOLN: 55.0000 [IU] | Freq: Every day | SUBCUTANEOUS | Status: DC

## 2013-12-17 NOTE — Assessment & Plan Note (Signed)
Lipid panel at goal. Will continue atorvastatin.

## 2013-12-17 NOTE — Assessment & Plan Note (Signed)
A1c 5.4 today and patient with weekly hyperglycemic episodes. Patient's control is likely too tight at this point. Will decrease Lantus to 55U daily and continue SSI and metformin. Will follow up in 3 months and titrate lantus as needed.

## 2013-12-17 NOTE — Assessment & Plan Note (Signed)
Exam reveals small excoriations and dry skin at edge of ear canal. Will treat symptomatically with moisturizers to edge of ear canal. Instructed patient to not scratch her ear or place anything in ear canals.

## 2013-12-17 NOTE — Assessment & Plan Note (Signed)
At goal. No complications. BMP on 12/7 unremarkable. Will continue accupril.

## 2013-12-17 NOTE — Patient Instructions (Signed)
Thank you for coming to the clinic today. It was nice seeing you.  For your diabetes, your A1c today was 5.4%. We would like to decrease your lantus to 55 units per day. Continue taking the novolog and metformin as before.   For your ears, it looks like you have some dry skin in your ears. Please try to not scratch your ear or place anything in your ear. You can try placing light moisturizers around the edge of the ear canal with your finger for symptomatic relief.  See you again in 3 months.

## 2013-12-17 NOTE — Progress Notes (Signed)
Christine Bradshaw is a 48 y.o. female who presents to the [facility] today with a chief complaint of diabetes follow up. Her concerns today include:  HPI:  DIABETES Type II Medications: Reports taking and tolerating withouside effects. Blood Sugars per patient: Ranging from 90s to 120s On Aspirin-yes On statin-yes Daily foot monitoring-yes  Patient reports hypoglycemic episodes approximately 1 time per week when she does eat. Says that she feels jittery and shaky. Glucose during these episodes is in the 60s. Is able to eat or drink juice with resolution of her symptoms.   ROS- Denies Polyuria,Polydipsia, nocturia, Vision changes, feet or hand numbness/pain/tingling.  Hemoglobin a1c:  Lab Results  Component Value Date   HGBA1C 5.4 12/17/2013   HGBA1C 5.5 10/01/2013   HGBA1C 5.5 04/22/2013    Hypertension BP Readings from Last 3 Encounters:  12/12/13 124/86  11/14/13 119/69  10/15/13 129/77   Home BP monitoring-No Compliant with medications-yes without side effects ROS-Denies any CP, HA, SOB, blurry vision, LE edema, transient weakness, orthopnea, PND.    Bilateral ear itching Started a few months ago, now worsening. Scratches ear canal with finger. No ear pain. No drainage. No rhinorrhea. No fevers or chills. Has not noticed anything that makes better or worse.   ROS: As per HPI, otherwise all systems reviewed and are negative.  Past Medical History - Reviewed and updated Patient Active Problem List   Diagnosis Date Noted  . Ear itching 12/17/2013  . Spondylosis of lumbar region without myelopathy or radiculopathy 10/15/2013  . Healthcare maintenance 10/01/2013  . Special screening for malignant neoplasms, colon 03/13/2013  . Dental abscess 11/16/2012  . Tobacco abuse 04/25/2011  . Skin lesion of left leg 04/21/2011  . Venous insufficiency 03/11/2010  . Diabetic neuropathy, painful 03/10/2010  . BIPOLAR DISORDER UNSPECIFIED 06/05/2009  . BACK PAIN, CHRONIC 06/05/2009   . Obesity, morbid, BMI 50 or higher 04/21/2008  . ANXIETY 04/21/2008  . Essential hypertension 04/21/2008  . Diabetes mellitus, type II, insulin dependent 04/17/2008  . Hyperlipidemia 04/17/2008  . Irritable bowel syndrome 04/17/2008  . COLONIC POLYPS, HYPERPLASTIC, HX OF 04/17/2008    Medications- reviewed and updated Current Outpatient Prescriptions  Medication Sig Dispense Refill  . aspirin EC 81 MG tablet Take 81 mg by mouth every morning.    . Blood Glucose Monitoring Suppl (PRODIGY AUTOCODE BLOOD GLUCOSE) W/DEVICE KIT by Does not apply route.      . clonazePAM (KLONOPIN) 1 MG tablet Take 1 mg by mouth 3 (three) times daily.    Marland Kitchen desloratadine (CLARINEX) 5 MG tablet Take 1 tablet (5 mg total) by mouth daily. 90 tablet 3  . diclofenac (VOLTAREN) 75 MG EC tablet TAKE ONE TABLET BY MOUTH TWICE DAILY AS NEEDED 60 tablet 0  . DULoxetine (CYMBALTA) 60 MG capsule Take 120 mg by mouth every morning.    Marland Kitchen esomeprazole (NEXIUM) 40 MG capsule Take 1 capsule (40 mg total) by mouth daily at 12 noon. 30 capsule 11  . fentaNYL (DURAGESIC - DOSED MCG/HR) 25 MCG/HR patch Place 1 patch (25 mcg total) onto the skin every 3 (three) days. 10 patch 0  . furosemide (LASIX) 40 MG tablet Take 1 tablet (40 mg total) by mouth daily as needed for fluid. 30 tablet 1  . gabapentin (NEURONTIN) 600 MG tablet Take 1 tablet (600 mg total) by mouth 3 (three) times daily. (Patient not taking: Reported on 12/12/2013) 3 tablet 1  . glucose blood (PRODIGY TEST) test strip 1 each by Other route as  needed. Test 4 times a day     . Hyprom-Naphaz-Polysorb-Zn Sulf (CLEAR EYES COMPLETE) SOLN Apply 1 drop to eye 3 (three) times daily as needed (dry eyes).    Marland Kitchen ibuprofen (ADVIL,MOTRIN) 200 MG tablet Take 400 mg by mouth every 6 (six) hours as needed.    . insulin aspart (NOVOLOG) 100 UNIT/ML injection Pt is on a sliding scale. 10 mL 2  . insulin glargine (LANTUS) 100 UNIT/ML injection Inject 0.55 mLs (55 Units total) into the  skin daily. 10 mL prn  . INSULIN SYRINGE .5CC/29G (RELION INSULIN SYR 0.5CC/29G) 29G X 1/2" 0.5 ML MISC Use for insulin administration QID 100 each 11  . Insulin Syringe-Needle U-100 (INSULIN SYRINGE 1CC/28G) 28G X 1/2" 1 ML MISC 1 Device by Does not apply route 4 (four) times daily. Pt requires syringe for >50 units of insulin 200 each 11  . lamoTRIgine (LAMICTAL) 100 MG tablet Take 100 mg by mouth 2 (two) times daily.      . Lancets MISC by Does not apply route. Four times a day     . metFORMIN (GLUCOPHAGE) 1000 MG tablet Take 1 tablet (1,000 mg total) by mouth 2 (two) times daily with a meal. 180 tablet 4  . methocarbamol (ROBAXIN) 500 MG tablet Take 1 tablet (500 mg total) by mouth every 8 (eight) hours as needed. 90 tablet 2  . omega-3 acid ethyl esters (LOVAZA) 1 G capsule Take 2 capsules (2 g total) by mouth daily. (Patient taking differently: Take 2 g by mouth 2 (two) times daily. ) 30 capsule 5  . oxyCODONE (OXY IR/ROXICODONE) 5 MG immediate release tablet Take 1 tablet (5 mg total) by mouth daily. May take twice a day when pain is severe 40 tablet 0  . pravastatin (PRAVACHOL) 40 MG tablet Take 40 mg by mouth every evening.    . quinapril (ACCUPRIL) 20 MG tablet Take 1 tablet (20 mg total) by mouth at bedtime. 30 tablet 2  . risperiDONE (RISPERDAL) 0.25 MG tablet Take 0.25 mg by mouth 3 (three) times daily.    . risperiDONE (RISPERDAL) 2 MG tablet Take 2 mg by mouth at bedtime.    . traZODone (DESYREL) 100 MG tablet Take 100 mg by mouth at bedtime.       No current facility-administered medications for this visit.    Objective: Physical Exam: Pulse 120  Temp(Src) 98.6 F (37 C) (Oral)  Wt 270 lb (122.471 kg)  Gen: NAD, resting comfortably HEENT: Ear canals with small excoriations noted bilaterally and dry skin. No other lesions noted. TMs clear bilaterally. CV: RRR with no murmurs appreciated Lungs: NWOB, CTAB with no crackles, wheezes, or rhonchi Abdomen: Normal bowel sounds  present. Soft, Nontender, Nondistended. Ext: no edema Skin: warm, dry Neuro: grossly normal, moves all extremities  Results for orders placed or performed in visit on 12/17/13 (from the past 72 hour(s))  POCT A1C     Status: None   Collection Time: 12/17/13  1:35 PM  Result Value Ref Range   Hemoglobin A1C 5.4     A/P: See problem list  Diabetes mellitus, type II, insulin dependent A1c 5.4 today and patient with weekly hyperglycemic episodes. Patient's control is likely too tight at this point. Will decrease Lantus to 55U daily and continue SSI and metformin. Will follow up in 3 months and titrate lantus as needed.  Essential hypertension At goal. No complications. BMP on 12/7 unremarkable. Will continue accupril.   Ear itching Exam reveals small excoriations and dry  skin at edge of ear canal. Will treat symptomatically with moisturizers to edge of ear canal. Instructed patient to not scratch her ear or place anything in ear canals.   Hyperlipidemia Lipid panel at goal. Will continue atorvastatin.     Orders Placed This Encounter  Procedures  . POCT A1C    Meds ordered this encounter  Medications  . insulin glargine (LANTUS) 100 UNIT/ML injection    Sig: Inject 0.55 mLs (55 Units total) into the skin daily.    Dispense:  10 mL    Refill:  prn     Caitland Porchia M. Jerline Pain, [place] Resident PGY-1 12/17/2013 4:50 PM

## 2013-12-30 ENCOUNTER — Other Ambulatory Visit: Payer: Self-pay | Admitting: Family Medicine

## 2014-01-09 ENCOUNTER — Encounter
Payer: No Typology Code available for payment source | Attending: Physical Medicine & Rehabilitation | Admitting: Registered Nurse

## 2014-01-09 ENCOUNTER — Encounter: Payer: Self-pay | Admitting: Registered Nurse

## 2014-01-09 VITALS — BP 128/79 | HR 88 | Resp 14

## 2014-01-09 DIAGNOSIS — M47816 Spondylosis without myelopathy or radiculopathy, lumbar region: Secondary | ICD-10-CM | POA: Diagnosis not present

## 2014-01-09 DIAGNOSIS — M549 Dorsalgia, unspecified: Secondary | ICD-10-CM

## 2014-01-09 DIAGNOSIS — M961 Postlaminectomy syndrome, not elsewhere classified: Secondary | ICD-10-CM

## 2014-01-09 DIAGNOSIS — Z79899 Other long term (current) drug therapy: Secondary | ICD-10-CM

## 2014-01-09 DIAGNOSIS — Z5181 Encounter for therapeutic drug level monitoring: Secondary | ICD-10-CM

## 2014-01-09 DIAGNOSIS — M47817 Spondylosis without myelopathy or radiculopathy, lumbosacral region: Secondary | ICD-10-CM

## 2014-01-09 DIAGNOSIS — G609 Hereditary and idiopathic neuropathy, unspecified: Secondary | ICD-10-CM

## 2014-01-09 DIAGNOSIS — G8929 Other chronic pain: Secondary | ICD-10-CM

## 2014-01-09 MED ORDER — FENTANYL 25 MCG/HR TD PT72: 25.0000 ug | MEDICATED_PATCH | TRANSDERMAL | Status: DC

## 2014-01-09 MED ORDER — GABAPENTIN 100 MG PO CAPS: 100.0000 mg | ORAL_CAPSULE | Freq: Three times a day (TID) | ORAL | Status: DC

## 2014-01-09 MED ORDER — OXYCODONE HCL 5 MG PO TABS: 5.0000 mg | ORAL_TABLET | Freq: Every day | ORAL | Status: DC

## 2014-01-09 NOTE — Patient Instructions (Signed)
Start Gabapentin tonight  Take one capsule at Bedtime for a week: For numbness and tingling  After one week if the Tingling and Numbness Persists Take capsule twice a day for a week  If the Numbness and Tingling Persists take One capsule three times a day   If you have any questions please call the office  [phone]

## 2014-01-09 NOTE — Progress Notes (Signed)
Subjective:    Patient ID: Christine Bradshaw, female    DOB: [birth year], 49 y.o.   MRN: [id number]  HPI: Christine Bradshaw is a 49 year old female who returns for follow up for chronic pain and medication refill. She says her pain is located in her lower back and radiates into her left hip and left leg.. She rates her pain 6. Her current exercise regime is performing chair exercises and walking. Encouraged to increase activity and use heat therapy.   Pain Inventory Average Pain 6 Pain Right Now 6 My pain is no selection  In the last 24 hours, has pain interfered with the following? General activity 6 Relation with others 4 Enjoyment of life 7 What TIME of day is your pain at its worst? daytime Sleep (in general) Fair  Pain is worse with: walking, standing and some activites Pain improves with: rest, heat/ice, medication and injections Relief from Meds: 5  Mobility use a walker how many minutes can you walk? 5 ability to climb steps?  yes do you drive?  no use a wheelchair transfers alone  Function I need assistance with the following:  meal prep, household duties and shopping Do you have any goals in this area?  yes  Neuro/Psych numbness depression anxiety  Prior Studies Any changes since last visit?  no  Physicians involved in your care Any changes since last visit?  no   Family History  Problem Relation Age of Onset  . Hyperlipidemia Father   . Hypertension Father   . Heart disease Father   . Colon polyps Neg Hx    History   Social History  . Marital Status: Married    Spouse Name: N/A    Number of Children: N/A  . Years of Education: N/A   Occupational History  . disability    Social History Main Topics  . Smoking status: Current Every Day Smoker -- 1.50 packs/day    Types: Cigarettes  . Smokeless tobacco: Never Used  . Alcohol Use: No  . Drug Use: No  . Sexual Activity:    Partners: Male   Other Topics Concern  . None   Social  History Narrative   Husband on disability   Pt also on disability   Past Surgical History  Procedure Laterality Date  . Tonsillectomy    . Cholecystectomy    . Appendectomy    . Esophagogastroduodenoscopy  2010    showed erosions  . Colonoscopy w/ biopsies  2010    adenomatous polyp repeat 2015  . Lumbar fusion  2011    L4-L5-S1 Dr. Marcial Pacas  . Abdominal hysterectomy    . Colonoscopy with propofol N/A 03/13/2013    Procedure: COLONOSCOPY WITH PROPOFOL;  Surgeon: Lafayette Dragon, MD;  Location: [facility];  Service: Endoscopy;  Laterality: N/A;   Past Medical History  Diagnosis Date  . Hypertension   . Depression   . Irritable bowel syndrome   . Diabetes mellitus type II   . Panic attack as reaction to stress   . Insomnia     03-08-13"states is in control"  . Tremor   . Memory loss of unknown cause   . Back pain, chronic   . HX OF GALLSTONE 04/21/2008    Qualifier: Diagnosis of  By: Julaine Hua CMA ([place]), Estill Bamberg    . HEADACHE, CHRONIC 04/21/2008  . Complication of anesthesia     manic episode  . GERD (gastroesophageal reflux disease)   . H/O hiatal hernia   .  Arthritis    BP 128/79 mmHg  Pulse 88  Resp 14  SpO2 94%  Opioid Risk Score:   Fall Risk Score: Moderate Fall Risk (6-13 points)  Review of Systems  Musculoskeletal: Positive for gait problem.  Neurological: Positive for numbness.       Spasms  Psychiatric/Behavioral: Positive for dysphoric mood. The patient is nervous/anxious.   All other systems reviewed and are negative.      Objective:   Physical Exam  Constitutional: She is oriented to person, place, and time. She appears well-developed and well-nourished.  HENT:  Head: Normocephalic and atraumatic.  Neck: Normal range of motion. Neck supple.  Cardiovascular: Normal rate and regular rhythm.   Pulmonary/Chest: Effort normal and breath sounds normal.  Musculoskeletal:  Normal Muscle Bulk and Muscle Testing Reveals: Upper extremities: Full ROM and Muscle  strength 5/5 Thoracic and Lumbar Hypersensitivity Lower Extremities: Full ROM and Muscle strength 5/5 Arises from chair with ease Narrow Based gait  Neurological: She is alert and oriented to person, place, and time.  Skin: Skin is warm and dry.  Psychiatric: She has a normal mood and affect.  Nursing note and vitals reviewed.         Assessment & Plan:  1. Lumbar postlaminectomy syndrome- status post L4-5 and L5-S1 fusion approximately 3-1/2 years ago with chronic postoperative pain as well as a chronic left L5 radiculopathy. Refilled: Fentanyl Patch 25 mcg one patch every three days #10 and Oxycodone 5 mg daily as needed #40.  Continue with heat and exercise therapy. Encourage to take Oxycodone as prescribed, she verbalizes understanding. 2. Lumbar spondylosis above the level of the fusion which has responded well to radio frequency ablation: Continue with Exercise regime.   20 minutes of face to face patient care time was spent during this visit. All questions was encouraged and answered.   F/U in 1 month.

## 2014-01-14 ENCOUNTER — Telehealth: Payer: Self-pay | Admitting: Family Medicine

## 2014-01-14 MED ORDER — "INSULIN SYRINGE 28G X 1/2"" 1 ML MISC": 1.0000 | Freq: Four times a day (QID) | Status: DC

## 2014-01-14 NOTE — Telephone Encounter (Signed)
Rx for needles sent in.  Algis Greenhouse. Jerline Pain, [place] Resident PGY-1 01/14/2014 5:26 PM

## 2014-01-14 NOTE — Telephone Encounter (Signed)
Pt called and needs a refill sent in for her short needles 1cc 8 ml 30 . jw

## 2014-01-23 ENCOUNTER — Telehealth: Payer: Self-pay | Admitting: *Deleted

## 2014-01-23 NOTE — Telephone Encounter (Signed)
Patient called the office today - she stated that she was given 40 oxycodone 5mg  and Gabapentin.  She was told that when pain is really severe she could take 2 tablets.  When she takes the 2 tablets her pain is very well controled - not gone but much better.  Can she continue on the 2 a day?  Please advise

## 2014-01-27 NOTE — Telephone Encounter (Signed)
Return Ms. [employer] call,  She has been having increase intensity of pain and has been taking her oxycodone twice a day with good relief noted. We will re-evaluate her on her next appointment. She verbalizes understanding.

## 2014-01-30 ENCOUNTER — Telehealth: Payer: Self-pay | Admitting: *Deleted

## 2014-01-30 NOTE — Telephone Encounter (Signed)
Pt called asking for [place] to submit PA for her fentanyl patches, she gave [place] a phone number to call [phone]

## 2014-01-31 NOTE — Telephone Encounter (Signed)
Prior authorization sent to [facility]

## 2014-02-01 ENCOUNTER — Other Ambulatory Visit: Payer: Self-pay | Admitting: Family Medicine

## 2014-02-04 ENCOUNTER — Other Ambulatory Visit: Payer: Self-pay | Admitting: Family Medicine

## 2014-02-04 NOTE — Telephone Encounter (Signed)
Pt called and would like her medication sent to [facility] on [address]. Pravastatin, Diclofenac, Accupril; . Christine Bradshaw

## 2014-02-05 MED ORDER — INSULIN GLARGINE 100 UNIT/ML ~~LOC~~ SOLN: 55.0000 [IU] | Freq: Every day | SUBCUTANEOUS | Status: DC

## 2014-02-05 MED ORDER — PRAVASTATIN SODIUM 40 MG PO TABS: 40.0000 mg | ORAL_TABLET | Freq: Every evening | ORAL | Status: DC

## 2014-02-05 MED ORDER — QUINAPRIL HCL 20 MG PO TABS
20.0000 mg | ORAL_TABLET | Freq: Every day | ORAL | Status: DC
Start: 2014-02-05 — End: 2014-04-22

## 2014-02-05 MED ORDER — QUINAPRIL HCL 20 MG PO TABS: 20.0000 mg | ORAL_TABLET | Freq: Every day | ORAL | Status: DC

## 2014-02-05 NOTE — Telephone Encounter (Signed)
Spoke with patient, she will schedule appt with Dr. Jerline Pain in March.  Also needs a refill on insulin. Christine Bradshaw, [name]

## 2014-02-05 NOTE — Addendum Note (Signed)
Addended by: Christen Bame D on: 02/05/2014 02:19 PM   Modules accepted: Orders

## 2014-02-05 NOTE — Telephone Encounter (Signed)
Rx for voltaren refilled. Will ask patient to schedule appointment soon to discuss long term pain management.   Christine Bradshaw. Jerline Pain, [place] Resident PGY-1 02/05/2014 12:18 PM

## 2014-02-07 ENCOUNTER — Emergency Department (HOSPITAL_COMMUNITY)
Admission: EM | Admit: 2014-02-07 | Discharge: 2014-02-07 | Disposition: A | Discharge status: Home / Self Care | Payer: No Typology Code available for payment source | Attending: Emergency Medicine | Admitting: Emergency Medicine

## 2014-02-07 ENCOUNTER — Encounter: Payer: Self-pay | Admitting: Registered Nurse

## 2014-02-07 ENCOUNTER — Encounter: Payer: No Typology Code available for payment source | Admitting: Registered Nurse

## 2014-02-07 ENCOUNTER — Encounter (HOSPITAL_COMMUNITY): Payer: Self-pay | Admitting: Emergency Medicine

## 2014-02-07 DIAGNOSIS — I10 Essential (primary) hypertension: Secondary | ICD-10-CM | POA: Diagnosis not present

## 2014-02-07 DIAGNOSIS — Z7982 Long term (current) use of aspirin: Secondary | ICD-10-CM | POA: Insufficient documentation

## 2014-02-07 DIAGNOSIS — Z79899 Other long term (current) drug therapy: Secondary | ICD-10-CM | POA: Insufficient documentation

## 2014-02-07 DIAGNOSIS — Z720 Tobacco use: Secondary | ICD-10-CM | POA: Diagnosis not present

## 2014-02-07 DIAGNOSIS — F329 Major depressive disorder, single episode, unspecified: Secondary | ICD-10-CM | POA: Diagnosis not present

## 2014-02-07 DIAGNOSIS — K589 Irritable bowel syndrome without diarrhea: Secondary | ICD-10-CM | POA: Diagnosis not present

## 2014-02-07 DIAGNOSIS — M1990 Unspecified osteoarthritis, unspecified site: Secondary | ICD-10-CM | POA: Diagnosis not present

## 2014-02-07 DIAGNOSIS — Z794 Long term (current) use of insulin: Secondary | ICD-10-CM | POA: Diagnosis not present

## 2014-02-07 DIAGNOSIS — G253 Myoclonus: Secondary | ICD-10-CM | POA: Insufficient documentation

## 2014-02-07 DIAGNOSIS — R55 Syncope and collapse: Secondary | ICD-10-CM | POA: Diagnosis present

## 2014-02-07 DIAGNOSIS — K219 Gastro-esophageal reflux disease without esophagitis: Secondary | ICD-10-CM | POA: Insufficient documentation

## 2014-02-07 DIAGNOSIS — E119 Type 2 diabetes mellitus without complications: Secondary | ICD-10-CM | POA: Insufficient documentation

## 2014-02-07 LAB — I-STAT CHEM 8, ED
BUN: 13 mg/dL (ref 6–23)
Calcium, Ion: 1.19 mmol/L (ref 1.12–1.23)
Chloride: 101 mmol/L (ref 96–112)
Creatinine, Ser: 0.9 mg/dL (ref 0.50–1.10)
Glucose, Bld: 87 mg/dL (ref 70–99)
HCT: 45 % (ref 36.0–46.0)
Hemoglobin: 15.3 g/dL — ABNORMAL HIGH (ref 12.0–15.0)
Potassium: 4.3 mmol/L (ref 3.5–5.1)
Sodium: 141 mmol/L (ref 135–145)
TCO2: 27 mmol/L (ref 0–100)

## 2014-02-07 LAB — CBG MONITORING, ED: Glucose-Capillary: 90 mg/dL (ref 70–99)

## 2014-02-07 MED ORDER — FUROSEMIDE 20 MG PO TABS: 40.0000 mg | ORAL_TABLET | Freq: Every day | ORAL | Status: DC | PRN

## 2014-02-07 MED ORDER — OXYCODONE HCL 5 MG PO TABS
5.0000 mg | ORAL_TABLET | Freq: Every day | ORAL | Status: DC
Start: 2014-02-07 — End: 2014-02-07
  Administered 2014-02-07: 5 mg via ORAL
  Filled 2014-02-07: qty 1

## 2014-02-07 MED ORDER — METFORMIN HCL 500 MG PO TABS
1000.0000 mg | ORAL_TABLET | Freq: Once | ORAL | Status: DC
  Filled 2014-02-07: qty 2

## 2014-02-07 NOTE — Discharge Instructions (Signed)
The medication I mentioned is Trileptal(oxcarbazepine)   Myoclonus Myoclonus is a term that refers to brief, involuntary twitching or jerking of a muscle or a group of muscles. It describes a symptom, and generally, is not a diagnosis of a disease. The myoclonic twitches or jerks are usually caused by sudden muscle contractions. They also can result from brief lapses of contraction. Myoclonic twitches or jerks may occur:  Alone or in sequence.  In a pattern or without pattern.  Infrequently or many times each minute. Often times, myoclonus is one of several symptoms in a wide variety of nervous system disorders such as:  Multiple sclerosis.  Parkinson's disease.  Alzheimer's disease.  Creutzfeldt-Jakob disease. Familiar examples of normal myoclonus include:  Hiccups and jerks.  "Sleep starts" that some people have while drifting off to sleep. Severe cases can severely limit a person's ability to:  Eat.  Talk.  Walk. Myoclonic jerks commonly occur in individuals with epilepsy. The most common types of myoclonus include:  Action.  Cortical reflex.  Essential.  Palatal.  Progressive myoclonus epilepsy.  Reticular reflex.  Sleep.  Stimulus-sensitive. TREATMENT  Treatment for myoclonus consists of medicines that may help reduce symptoms. These drugs (many of which are also used to treat epilepsy) include:   Barbiturates.  Clonazepam.  Phenytoin.  Primidone.  Sodium valproate. The complex origins of myoclonus may require the use of multiple drugs. Document Released: 12/10/2001 Document Revised: 03/14/2011 Document Reviewed: 11/22/2012 [facility] Patient Information 2015 [place], [place]. This information is not intended to replace advice given to you by your health care provider. Make sure you discuss any questions you have with your health care provider.

## 2014-02-07 NOTE — Progress Notes (Signed)
Office Staff called out Patient fell on the floor. EMS called by office staff. Upon my arrival to the waiting room, [name] was on the floor unresponsive. Pulse was thready and weak. Blood pressure 37'J-69'C systolic. Husband by her side. Husband states " don't call 911 she has these episodes frequently. Informed husband we will send her to [facility] to be evaluated. He says she has these seizures frequently, no seizing noted. Assessment made syncopal episodes.   Patient alert and oriented x3. She states she has been having these episodes for 20 years.  She had a similar episode yesterday, she didn't seek medical attention yesterday.  EMS arrived, placed on the monitor, upon assisting Mrs. Christine Bradshaw to stretcher via standing position she had another syncopal episode. She has been transported to [facility] via EMS staff.

## 2014-02-07 NOTE — ED Notes (Signed)
Pt arrived from [facility] for pain with c/o syncopal episode. Pt was standing at check in desk and passed out and fell to to the floor. Pt denies hitting head, no injuries from fall. Stated that this has been happening to her for the past 20 years and there has not been a clear diagnosis on what is causing this. Stated that it happens approximately 10x a year. Denies any n/v. Initial BP 80 palp last BP 112/78 HR-85 CBG-150 Pt stated CBG is never that night and that is run between 90-120. Pain patch on the middle lower part of back. Denies any pain other than chronic back pain that is no worse than normal.

## 2014-02-07 NOTE — ED Provider Notes (Signed)
CSN: [id number]     Arrival date & time 02/07/14  0920 History   First MD Initiated Contact with Patient 02/07/14 0930     Chief Complaint  Patient presents with  . Loss of Consciousness     (Consider location/radiation/quality/duration/timing/severity/associated sxs/prior Treatment) HPI  Expand All Collapse All   Pt [facility] for pain with c/o episode where she doesn't lose consciousness but she loses motor control.  Workups in the past and thought it might be some type of myoclonic seizures. . Pt was standing at check in desk and passed out and fell to to the floor. Pt denies hitting head, no injuries from fall. Stated that this has been happening to her for the past 20 years and there has not been a clear diagnosis on what is causing this. Stated that it happens approximately 10x a year. Denies any n/v. Initial BP 80 palp last BP 112/78 HR-85 CBG-150 Pt stated CBG is never that night and that is run between 90-120. Pain patch on the middle lower part of back. Denies any pain other than chronic back pain that is no worse than normal      Past Medical History  Diagnosis Date  . Hypertension   . Depression   . Irritable bowel syndrome   . Diabetes mellitus type II   . Panic attack as reaction to stress   . Insomnia     03-08-13"states is in control"  . Tremor   . Memory loss of unknown cause   . Back pain, chronic   . HX OF GALLSTONE 04/21/2008    Qualifier: Diagnosis of  By: Julaine Hua CMA ([place]), Estill Bamberg    . HEADACHE, CHRONIC 04/21/2008  . Complication of anesthesia     manic episode  . GERD (gastroesophageal reflux disease)   . H/O hiatal hernia   . Arthritis    Past Surgical History  Procedure Laterality Date  . Tonsillectomy    . Cholecystectomy    . Appendectomy    . Esophagogastroduodenoscopy  2010    showed erosions  . Colonoscopy w/ biopsies  2010    adenomatous polyp repeat 2015  . Lumbar fusion  2011    L4-L5-S1 Dr. Marcial Pacas  . Abdominal hysterectomy     . Colonoscopy with propofol N/A 03/13/2013    Procedure: COLONOSCOPY WITH PROPOFOL;  Surgeon: Lafayette Dragon, MD;  Location: [facility];  Service: Endoscopy;  Laterality: N/A;   Family History  Problem Relation Age of Onset  . Hyperlipidemia Father   . Hypertension Father   . Heart disease Father   . Colon polyps Neg Hx    History  Substance Use Topics  . Smoking status: Current Every Day Smoker -- 1.50 packs/day    Types: Cigarettes  . Smokeless tobacco: Never Used  . Alcohol Use: No   OB History    No data available     Review of Systems  All other systems reviewed and are negative  Allergies  Abilify and Lithium  Home Medications   Prior to Admission medications   Medication Sig Start Date End Date Taking? Authorizing Provider  aspirin EC 81 MG tablet Take 81 mg by mouth every morning.   Yes Historical Provider, MD  clonazePAM (KLONOPIN) 1 MG tablet Take 1 mg by mouth 3 (three) times daily.   Yes Historical Provider, MD  desloratadine (CLARINEX) 5 MG tablet Take 1 tablet (5 mg total) by mouth daily. 10/14/13 10/11/14 Yes Dimas Chyle, MD  diclofenac (VOLTAREN) 75  MG EC tablet Take 75 mg by mouth 2 (two) times daily as needed for moderate pain.   Yes Historical Provider, MD  DULoxetine (CYMBALTA) 60 MG capsule Take 120 mg by mouth every morning.   Yes Historical Provider, MD  esomeprazole (NEXIUM) 40 MG capsule Take 1 capsule (40 mg total) by mouth daily at 12 noon. 11/21/13  Yes Dimas Chyle, MD  fentaNYL (DURAGESIC - DOSED MCG/HR) 25 MCG/HR patch Place 1 patch (25 mcg total) onto the skin every 3 (three) days. 01/09/14  Yes Danella Sensing, NP  furosemide (LASIX) 40 MG tablet Take 1 tablet (40 mg total) by mouth daily as needed for fluid. 04/22/13  Yes Marin Olp, MD  gabapentin (NEURONTIN) 100 MG capsule Take 1 capsule (100 mg total) by mouth 3 (three) times daily. Patient taking differently: Take 100 mg by mouth 2 (two) times daily.  01/09/14  Yes Danella Sensing, NP   Hyprom-Naphaz-Polysorb-Zn Sulf (CLEAR EYES COMPLETE) SOLN Apply 1 drop to eye 3 (three) times daily as needed (dry eyes).   Yes Historical Provider, MD  ibuprofen (ADVIL,MOTRIN) 200 MG tablet Take 400 mg by mouth every 6 (six) hours as needed.   Yes Historical Provider, MD  insulin glargine (LANTUS) 100 UNIT/ML injection Inject 0.55 mLs (55 Units total) into the skin daily. 02/05/14 02/04/17 Yes Dimas Chyle, MD  lamoTRIgine (LAMICTAL) 100 MG tablet Take 100 mg by mouth 2 (two) times daily.     Yes Historical Provider, MD  metFORMIN (GLUCOPHAGE) 1000 MG tablet Take 1 tablet (1,000 mg total) by mouth 2 (two) times daily with a meal. 06/28/13  Yes Marin Olp, MD  methocarbamol (ROBAXIN) 500 MG tablet Take 1 tablet (500 mg total) by mouth every 8 (eight) hours as needed. 08/26/13  Yes Danella Sensing, NP  omega-3 acid ethyl esters (LOVAZA) 1 G capsule Take 2 capsules (2 g total) by mouth daily. Patient taking differently: Take 2 g by mouth 2 (two) times daily.  10/07/13  Yes Dimas Chyle, MD  OVER THE COUNTER MEDICATION Take 2 capsules by mouth 2 (two) times daily. lipozene   Yes Historical Provider, MD  oxyCODONE (OXY IR/ROXICODONE) 5 MG immediate release tablet Take 1 tablet (5 mg total) by mouth daily. May take twice a day when pain is severe 01/09/14  Yes Danella Sensing, NP  pravastatin (PRAVACHOL) 40 MG tablet Take 1 tablet (40 mg total) by mouth every evening. 02/05/14  Yes Dimas Chyle, MD  quinapril (ACCUPRIL) 20 MG tablet Take 1 tablet (20 mg total) by mouth at bedtime. 02/05/14  Yes Dimas Chyle, MD  risperiDONE (RISPERDAL) 0.25 MG tablet Take 0.25 mg by mouth 3 (three) times daily.   Yes Historical Provider, MD  risperiDONE (RISPERDAL) 2 MG tablet Take 2 mg by mouth at bedtime.   Yes Historical Provider, MD  traZODone (DESYREL) 100 MG tablet Take 100 mg by mouth at bedtime.     Yes Historical Provider, MD  Blood Glucose Monitoring Suppl (PRODIGY AUTOCODE BLOOD GLUCOSE) W/DEVICE KIT by Does not apply  route.      Historical Provider, MD  diclofenac (VOLTAREN) 75 MG EC tablet TAKE ONE TABLET BY MOUTH TWICE DAILY AS NEEDED Patient not taking: Reported on 02/07/2014 02/05/14   Dimas Chyle, MD  glucose blood (PRODIGY TEST) test strip 1 each by Other route as needed. Test 4 times a day     Historical Provider, MD  insulin aspart (NOVOLOG) 100 UNIT/ML injection Pt is on a sliding scale. 10/30/13   Dimas Chyle,  MD  INSULIN SYRINGE .5CC/29G (RELION INSULIN SYR 0.5CC/29G) 29G X 1/2" 0.5 ML MISC Use for insulin administration QID 08/23/10   Judithann Sheen, MD  Insulin Syringe-Needle U-100 (INSULIN SYRINGE 1CC/28G) 28G X 1/2" 1 ML MISC 1 Device by Does not apply route 4 (four) times daily. Pt requires syringe for >50 units of insulin 01/14/14   Dimas Chyle, MD  Lancets MISC by Does not apply route. Four times a day     Historical Provider, MD   BP 107/74 mmHg  Pulse 63  Temp(Src) 98.2 F (36.8 C) (Oral)  Resp 19  SpO2 95% Physical Exam Physical Exam  Nursing note and vitals reviewed. Constitutional: She is oriented to person, place, and time. She appears well-developed and well-nourished. No distress.  HENT:  Head: Normocephalic and atraumatic.  Eyes: Pupils are equal, round, and reactive to light.  Neck: Normal range of motion.  Cardiovascular: Normal rate and intact distal pulses.   Pulmonary/Chest: No respiratory distress.  Abdominal: Normal appearance. She exhibits no distension.  Musculoskeletal: Normal range of motion.  Neurological: She is alert and oriented to person, place, and time. No cranial nerve deficit.  Skin: Skin is warm and dry. No rash noted.  Psychiatric: She has a normal mood and affect. Her behavior is normal.   ED Course  Procedures (including critical care time) Labs Review Labs Reviewed  I-STAT CHEM 8, ED - Abnormal; Notable for the following:    Hemoglobin 15.3 (*)    All other components within normal limits  CBG MONITORING, ED    Imaging Review No results  found.   EKG Interpretation   Date/Time:  Friday February 07 2014 09:32:35 EST Ventricular Rate:  72 PR Interval:  167 QRS Duration: 97 QT Interval:  416 QTC Calculation: 455 R Axis:   39 Text Interpretation:  Sinus rhythm Abnormal R-wave progression, early  transition ED PHYSICIAN INTERPRETATION AVAILABLE IN [facility]  Confirmed by TEST, Record ([id number]) on 02/09/2014 8:58:23 AM      MDM   Final diagnoses:  Myoclonic disorder        Dot Lanes, MD 02/11/14 [id number]

## 2014-02-10 ENCOUNTER — Telehealth: Payer: Self-pay | Admitting: *Deleted

## 2014-02-10 NOTE — Telephone Encounter (Signed)
Recd approval letter from [facility] stating that patient Fentanyl will be covered from 02/08/2014 through 02/09/2015 referral # [id number]

## 2014-02-11 ENCOUNTER — Encounter
Payer: No Typology Code available for payment source | Attending: Physical Medicine & Rehabilitation | Admitting: Registered Nurse

## 2014-02-11 ENCOUNTER — Encounter: Payer: Self-pay | Admitting: Registered Nurse

## 2014-02-11 VITALS — BP 103/70 | HR 83 | Resp 14

## 2014-02-11 DIAGNOSIS — M549 Dorsalgia, unspecified: Secondary | ICD-10-CM

## 2014-02-11 DIAGNOSIS — G8929 Other chronic pain: Secondary | ICD-10-CM

## 2014-02-11 DIAGNOSIS — M47816 Spondylosis without myelopathy or radiculopathy, lumbar region: Secondary | ICD-10-CM | POA: Insufficient documentation

## 2014-02-11 DIAGNOSIS — M47817 Spondylosis without myelopathy or radiculopathy, lumbosacral region: Secondary | ICD-10-CM

## 2014-02-11 DIAGNOSIS — Z5181 Encounter for therapeutic drug level monitoring: Secondary | ICD-10-CM

## 2014-02-11 DIAGNOSIS — Z79899 Other long term (current) drug therapy: Secondary | ICD-10-CM

## 2014-02-11 DIAGNOSIS — M961 Postlaminectomy syndrome, not elsewhere classified: Secondary | ICD-10-CM

## 2014-02-11 MED ORDER — OXYCODONE HCL 5 MG PO TABS: 5.0000 mg | ORAL_TABLET | Freq: Every day | ORAL | Status: DC

## 2014-02-11 MED ORDER — FENTANYL 25 MCG/HR TD PT72: 25.0000 ug | MEDICATED_PATCH | TRANSDERMAL | Status: DC

## 2014-02-11 NOTE — Progress Notes (Signed)
Subjective:    Patient ID: Christine Bradshaw, female    DOB: 1965-03-26, 49 y.o.   MRN: [id number]  HPI: [name] is a 49 year old female who returns for follow up for chronic pain and medication refill. She says her pain is located in her lower back. She states " since she has been taking her oxycodone twice a day her pain is manageable. Tablets will be increased today. She rates her pain 5. Her current exercise regime is performing chair and stretching exercises and walking. She  Went to ED on 02/07/2014 was diagnosed Myoclonic Disorder she will be following with [facility]. Had an episode on 02/07/2014 she was getting out of her chair, she felt light headed and fell back into the chair. Family was present. Encouraged to call [facility] for an appointment she verbalizes understanding.  Pain Inventory Average Pain 5 Pain Right Now 5 My pain is sharp, dull and stabbing  In the last 24 hours, has pain interfered with the following? General activity 6 Relation with others 5 Enjoyment of life 7 What TIME of day is your pain at its worst? daytime, evening Sleep (in general) Fair  Pain is worse with: walking, sitting and standing Pain improves with: rest and medication Relief from Meds: 5  Mobility walk without assistance walk with assistance use a walker how many minutes can you walk? 5 ability to climb steps?  yes do you drive?  no use a wheelchair transfers alone Do you have any goals in this area?  yes  Function I need assistance with the following:  meal prep, household duties and shopping Do you have any goals in this area?  yes  Neuro/Psych weakness numbness spasms dizziness depression anxiety  Prior Studies Any changes since last visit?  no  Physicians involved in your care Any changes since last visit?  no   Family History  Problem Relation Age of Onset  . Hyperlipidemia Father   . Hypertension Father   . Heart disease Father     . Colon polyps Neg Hx    History   Social History  . Marital Status: Married    Spouse Name: N/A    Number of Children: N/A  . Years of Education: N/A   Occupational History  . disability    Social History Main Topics  . Smoking status: Current Every Day Smoker -- 1.50 packs/day    Types: Cigarettes  . Smokeless tobacco: Never Used  . Alcohol Use: No  . Drug Use: No  . Sexual Activity:    Partners: Male   Other Topics Concern  . None   Social History Narrative   Husband on disability   Pt also on disability   Past Surgical History  Procedure Laterality Date  . Tonsillectomy    . Cholecystectomy    . Appendectomy    . Esophagogastroduodenoscopy  2010    showed erosions  . Colonoscopy w/ biopsies  2010    adenomatous polyp repeat 2015  . Lumbar fusion  2011    L4-L5-S1 Dr. Marcial Pacas  . Abdominal hysterectomy    . Colonoscopy with propofol N/A 03/13/2013    Procedure: COLONOSCOPY WITH PROPOFOL;  Surgeon: Lafayette Dragon, MD;  Location: [facility];  Service: Endoscopy;  Laterality: N/A;   Past Medical History  Diagnosis Date  . Hypertension   . Depression   . Irritable bowel syndrome   . Diabetes mellitus type II   . Panic attack as  reaction to stress   . Insomnia     03-08-13"states is in control"  . Tremor   . Memory loss of unknown cause   . Back pain, chronic   . HX OF GALLSTONE 04/21/2008    Qualifier: Diagnosis of  By: Julaine Hua CMA ([place]), Estill Bamberg    . HEADACHE, CHRONIC 04/21/2008  . Complication of anesthesia     manic episode  . GERD (gastroesophageal reflux disease)   . H/O hiatal hernia   . Arthritis    BP 103/70 mmHg  Pulse 83  Resp 14  SpO2 93%  Opioid Risk Score:   Fall Risk Score: Moderate Fall Risk (6-13 points) (patient previously educated) Review of Systems  Neurological: Positive for dizziness, weakness and numbness.  Psychiatric/Behavioral: Positive for dysphoric mood. The patient is nervous/anxious.   All other systems reviewed and  are negative.      Objective:   Physical Exam  Constitutional: She is oriented to person, place, and time. She appears well-developed and well-nourished.  HENT:  Head: Normocephalic and atraumatic.  Neck: Normal range of motion. Neck supple.  Cardiovascular: Normal rate and regular rhythm.   Pulmonary/Chest: Effort normal and breath sounds normal.  Musculoskeletal:  Normal Muscle Bulk and Muscle Testing Reveals: Upper Extremities: Full ROM and Muscle Strength 5/5 Lumbar Paraspinal Tenderness: L-3- L-5 Lower Extremities: Full ROM and Muscle Strength 5/5 Arises from chair with ease Narrow Based Gait  Neurological: She is alert and oriented to person, place, and time.  Skin: Skin is warm and dry.  Psychiatric: She has a normal mood and affect.  Nursing note and vitals reviewed.         Assessment & Plan:  1. Lumbar postlaminectomy syndrome- status post L4-5 and L5-S1 fusion approximately 3-1/2 years ago with chronic postoperative pain as well as a chronic left L5 radiculopathy. Refilled: Fentanyl Patch 25 mcg one patch every three days #10 and Oxycodone 5 mg daily as needed increased to #50.  Continue with heat and exercise therapy. Encourage to take Oxycodone as prescribed, she verbalizes understanding. 2. Lumbar spondylosis above the level of the fusion which has responded well to radio frequency ablation: Continue with Exercise regime.  3. Myoclonic Disorder: F/U Neurology 20 minutes of face to face patient care time was spent during this visit. All questions was encouraged and answered.   F/U in 1 month.

## 2014-02-25 ENCOUNTER — Telehealth: Payer: Self-pay | Admitting: Family Medicine

## 2014-02-25 NOTE — Telephone Encounter (Signed)
Will forward to MD for review. Oletta Lamas, CMA.

## 2014-02-25 NOTE — Telephone Encounter (Signed)
Patient advised as directed below and verbalized understanding. Next appt. Is 03/05/14 at 2:30pm with PCP. Oletta Lamas, CMA.

## 2014-02-25 NOTE — Telephone Encounter (Signed)
insurance wants pt to change levemir vile instead of the insulin She is not out yet. She has an appt next wed

## 2014-02-25 NOTE — Telephone Encounter (Signed)
Will discuss this at our next appointment.  Algis Greenhouse. Jerline Pain, [place] Resident PGY-1 02/25/2014 12:11 PM

## 2014-03-05 ENCOUNTER — Ambulatory Visit (INDEPENDENT_AMBULATORY_CARE_PROVIDER_SITE_OTHER): Payer: No Typology Code available for payment source | Admitting: Family Medicine

## 2014-03-05 ENCOUNTER — Encounter: Payer: Self-pay | Admitting: Family Medicine

## 2014-03-05 VITALS — BP 117/87 | HR 109 | Temp 98.4°F | Wt 262.4 lb

## 2014-03-05 DIAGNOSIS — E119 Type 2 diabetes mellitus without complications: Secondary | ICD-10-CM

## 2014-03-05 DIAGNOSIS — Z794 Long term (current) use of insulin: Secondary | ICD-10-CM

## 2014-03-05 DIAGNOSIS — R569 Unspecified convulsions: Secondary | ICD-10-CM

## 2014-03-05 LAB — POCT GLYCOSYLATED HEMOGLOBIN (HGB A1C): HEMOGLOBIN A1C: 5.4

## 2014-03-05 MED ORDER — INSULIN DETEMIR 100 UNIT/ML FLEXPEN: 30.0000 [IU] | PEN_INJECTOR | Freq: Every day | SUBCUTANEOUS | Status: DC

## 2014-03-05 NOTE — Assessment & Plan Note (Signed)
Instructed patient to stop taking diclofenac as a long term medication due to the associated increased risk of MI and CVA. Patient understood. Did not feel like it was providing much benefit. Will follow up with pain clinic.

## 2014-03-05 NOTE — Progress Notes (Addendum)
Christine Bradshaw is a 49 y.o. female who presents to the [facility] today with a chief complaint of diabetes follow up.   HPI:  DIABETES Type II Patient currently on metformin, Lantus 55U, and novolog SSI. Reports that she is currently tolerating the medications fine without side effects. Has been checking her sugar 3 times a day. Usually runs in the 95-120 range. Reports some intermittent dizziness, but denies any other hypoglycemic symptoms such has palpitations, tremors, or sweating. She checks her blood sugar when she is dizzy which is around 100. No polyuria, polydipsia, vision changes.  Patient's insurance is no longer covering lantus and the patient will need to be switched to levemir.   Daily foot monitoring-yes Last eye exam: 05/07/2012 Last foot exam: 11/08/2012   Back Pain Patient has been seen by pain clinic for several years. Is currently seeing them monthly. Patient has been on diclofenac daily for her pain as well as oxycodone and fentanyl. Patient states that she does not feel like the diclofenac is helping her pain.   Seizure-like Activity.  Patient does report that she had a seizure last month while waiting at her pain clinic. Patient reports that she has a history of such events for the past 21 years. States that EMS was called. Was not told that she had low blood sugar. Will be following up with a neurologist later this month.    ROS: As per HPI, otherwise all systems reviewed and are negative.  Past Medical History - Reviewed and updated Patient Active Problem List   Diagnosis Date Noted  . Seizure-like activity 03/05/2014  . Ear itching 12/17/2013  . Spondylosis of lumbar region without myelopathy or radiculopathy 10/15/2013  . Healthcare maintenance 10/01/2013  . Special screening for malignant neoplasms, colon 03/13/2013  . Dental abscess 11/16/2012  . Tobacco abuse 04/25/2011  . Skin lesion of left leg 04/21/2011  . Venous insufficiency 03/11/2010  . Diabetic  neuropathy, painful 03/10/2010  . BIPOLAR DISORDER UNSPECIFIED 06/05/2009  . BACK PAIN, CHRONIC 06/05/2009  . Obesity, morbid, BMI 50 or higher 04/21/2008  . ANXIETY 04/21/2008  . Essential hypertension 04/21/2008  . Diabetes mellitus, type II, insulin dependent 04/17/2008  . Hyperlipidemia 04/17/2008  . Irritable bowel syndrome 04/17/2008  . COLONIC POLYPS, HYPERPLASTIC, HX OF 04/17/2008    Medications- reviewed and updated Current Outpatient Prescriptions  Medication Sig Dispense Refill  . aspirin EC 81 MG tablet Take 81 mg by mouth every morning.    . Blood Glucose Monitoring Suppl (PRODIGY AUTOCODE BLOOD GLUCOSE) W/DEVICE KIT by Does not apply route.      . clonazePAM (KLONOPIN) 1 MG tablet Take 1 mg by mouth 3 (three) times daily.    . DULoxetine (CYMBALTA) 60 MG capsule Take 120 mg by mouth every morning.    Marland Kitchen esomeprazole (NEXIUM) 40 MG capsule Take 1 capsule (40 mg total) by mouth daily at 12 noon. 30 capsule 11  . fentaNYL (DURAGESIC - DOSED MCG/HR) 25 MCG/HR patch Place 1 patch (25 mcg total) onto the skin every 3 (three) days. 10 patch 0  . furosemide (LASIX) 40 MG tablet Take 1 tablet (40 mg total) by mouth daily as needed for fluid. 30 tablet 1  . glucose blood (PRODIGY TEST) test strip 1 each by Other route as needed. Test 4 times a day     . ibuprofen (ADVIL,MOTRIN) 200 MG tablet Take 400 mg by mouth every 6 (six) hours as needed.    . insulin aspart (NOVOLOG) 100 UNIT/ML injection  Pt is on a sliding scale. 10 mL 2  . INSULIN SYRINGE .5CC/29G (RELION INSULIN SYR 0.5CC/29G) 29G X 1/2" 0.5 ML MISC Use for insulin administration QID 100 each 11  . Insulin Syringe-Needle U-100 (INSULIN SYRINGE 1CC/28G) 28G X 1/2" 1 ML MISC 1 Device by Does not apply route 4 (four) times daily. Pt requires syringe for >50 units of insulin 200 each 11  . lamoTRIgine (LAMICTAL) 100 MG tablet Take 100 mg by mouth 2 (two) times daily.      . Lancets MISC by Does not apply route. Four times a day       . metFORMIN (GLUCOPHAGE) 1000 MG tablet Take 1 tablet (1,000 mg total) by mouth 2 (two) times daily with a meal. 180 tablet 4  . methocarbamol (ROBAXIN) 500 MG tablet Take 1 tablet (500 mg total) by mouth every 8 (eight) hours as needed. 90 tablet 2  . omega-3 acid ethyl esters (LOVAZA) 1 G capsule Take 2 capsules (2 g total) by mouth daily. (Patient taking differently: Take 2 g by mouth 2 (two) times daily. ) 30 capsule 5  . oxyCODONE (OXY IR/ROXICODONE) 5 MG immediate release tablet Take 1 tablet (5 mg total) by mouth daily. May take twice a day when pain is severe 50 tablet 0  . pravastatin (PRAVACHOL) 40 MG tablet Take 1 tablet (40 mg total) by mouth every evening. 30 tablet 3  . quinapril (ACCUPRIL) 20 MG tablet Take 1 tablet (20 mg total) by mouth at bedtime. 30 tablet 2  . risperiDONE (RISPERDAL) 0.25 MG tablet Take 0.25 mg by mouth 3 (three) times daily.    . risperiDONE (RISPERDAL) 2 MG tablet Take 2 mg by mouth at bedtime.    . traZODone (DESYREL) 100 MG tablet Take 100 mg by mouth at bedtime.      . diclofenac (VOLTAREN) 75 MG EC tablet TAKE ONE TABLET BY MOUTH TWICE DAILY AS NEEDED 60 tablet 0  . diclofenac (VOLTAREN) 75 MG EC tablet Take 75 mg by mouth 2 (two) times daily as needed for moderate pain.    Marland Kitchen gabapentin (NEURONTIN) 100 MG capsule Take 1 capsule (100 mg total) by mouth 3 (three) times daily. (Patient not taking: Reported on 03/05/2014) 90 capsule 3  . Insulin Detemir (LEVEMIR) 100 UNIT/ML Pen Inject 30 Units into the skin daily at 10 pm. 15 mL 11   No current facility-administered medications for this visit.    Objective: Physical Exam: BP 117/87 mmHg  Pulse 109  Temp(Src) 98.4 F (36.9 C) (Oral)  Wt 262 lb 7 oz (119.041 kg)  Gen: NAD, resting comfortably CV: RRR with no murmurs appreciated Lungs: NWOB, CTAB with no crackles, wheezes, or rhonchi Abdomen: Normal bowel sounds present. Soft, Nontender, Nondistended. Ext: no edema, left heel with small 69m  excoriation. No erythema or signs of infection Skin: warm, dry Neuro: grossly normal, moves all extremities  Results for orders placed or performed in visit on 03/05/14 (from the past 72 hour(s))  POCT glycosylated hemoglobin (Hb A1C)     Status: None   Collection Time: 03/05/14  2:44 PM  Result Value Ref Range   Hemoglobin A1C 5.4     A/P: See problem list  Diabetes mellitus, type II, insulin dependent A1c today 5.4. Will change from lantus to levemir. Additionally, will cut dose back to 30 units today given the patient's low A1c and questionable seizure like activity. Instructed patient to call clinic if her fasting blood sugars started running consistently 150 or higher.  Foot exam today. Instructed patient to place bandage on small excoriation on foot and to keep a close eye on it to make sure it was healing well. Patient to call or return to clinic if not healing or shows signs of becoming infected.   Pt will be having eye exam within the next few months.    BACK PAIN, CHRONIC Instructed patient to stop taking diclofenac as a long term medication due to the associated increased risk of MI and CVA. Patient understood. Did not feel like it was providing much benefit. Will follow up with pain clinic.    Seizure-like activity Chronic problem for patient. Seen in ED last month. No issues since last episode a month ago. Will follow up with neurology later this month.     Orders Placed This Encounter  Procedures  . POCT glycosylated hemoglobin (Hb A1C)    Meds ordered this encounter  Medications  . Insulin Detemir (LEVEMIR) 100 UNIT/ML Pen    Sig: Inject 30 Units into the skin daily at 10 pm.    Dispense:  15 mL    Refill:  [place] M. Jerline Pain, [place] Resident PGY-1 03/05/2014 5:33 PM

## 2014-03-05 NOTE — Assessment & Plan Note (Addendum)
A1c today 5.4. Will change from lantus to levemir. Additionally, will cut dose back to 30 units today given the patient's low A1c and questionable seizure like activity. Instructed patient to call clinic if her fasting blood sugars started running consistently 150 or higher.   Foot exam today. Instructed patient to place bandage on small excoriation on foot and to keep a close eye on it to make sure it was healing well. Patient to call or return to clinic if not healing or shows signs of becoming infected.   Pt will be having eye exam within the next few months.

## 2014-03-05 NOTE — Patient Instructions (Signed)
Thank you for coming to the clinic today. It was nice seeing you.  For your insulin, your A1c today was 5.4. We will be changing your Lantus to Levemir. We have sent in the pen to the pharmacy. You should start taking 30 units of the levemir today. You should stop taking the Lantus. It is important that you keep a close eye on your blood sugars. If they are consistently more than 150, please come back to the office.  Please make sure you have your eye exam soon.  We will see you again in 3 months, or sooner if needed.

## 2014-03-05 NOTE — Assessment & Plan Note (Signed)
Chronic problem for patient. Seen in ED last month. No issues since last episode a month ago. Will follow up with neurology later this month.

## 2014-03-10 ENCOUNTER — Telehealth: Payer: Self-pay | Admitting: Family Medicine

## 2014-03-10 NOTE — Telephone Encounter (Signed)
It is ok for her to take it in the morning.  Algis Greenhouse. Jerline Pain, [place] Resident PGY-1 03/10/2014 4:51 PM

## 2014-03-10 NOTE — Telephone Encounter (Signed)
Pt is aware.  Jazmin Hartsell,CMA  

## 2014-03-10 NOTE — Telephone Encounter (Signed)
Pt needs to clarify her insulin dosage, says the instructions say to take at night but pt normally takes it in the morning, just needs to know if this is ok.

## 2014-03-13 ENCOUNTER — Other Ambulatory Visit: Payer: Self-pay | Admitting: Physical Medicine & Rehabilitation

## 2014-03-13 ENCOUNTER — Encounter
Payer: No Typology Code available for payment source | Attending: Physical Medicine & Rehabilitation | Admitting: Registered Nurse

## 2014-03-13 ENCOUNTER — Encounter: Payer: Self-pay | Admitting: Registered Nurse

## 2014-03-13 VITALS — BP 102/66 | HR 79 | Resp 14

## 2014-03-13 DIAGNOSIS — Z5181 Encounter for therapeutic drug level monitoring: Secondary | ICD-10-CM

## 2014-03-13 DIAGNOSIS — M7061 Trochanteric bursitis, right hip: Secondary | ICD-10-CM

## 2014-03-13 DIAGNOSIS — M47816 Spondylosis without myelopathy or radiculopathy, lumbar region: Secondary | ICD-10-CM | POA: Diagnosis not present

## 2014-03-13 DIAGNOSIS — G8929 Other chronic pain: Secondary | ICD-10-CM

## 2014-03-13 DIAGNOSIS — Z79899 Other long term (current) drug therapy: Secondary | ICD-10-CM

## 2014-03-13 DIAGNOSIS — M7062 Trochanteric bursitis, left hip: Secondary | ICD-10-CM

## 2014-03-13 DIAGNOSIS — M549 Dorsalgia, unspecified: Secondary | ICD-10-CM

## 2014-03-13 DIAGNOSIS — M47817 Spondylosis without myelopathy or radiculopathy, lumbosacral region: Secondary | ICD-10-CM

## 2014-03-13 DIAGNOSIS — M961 Postlaminectomy syndrome, not elsewhere classified: Secondary | ICD-10-CM

## 2014-03-13 MED ORDER — METHYLPREDNISOLONE 4 MG PO KIT: PACK | ORAL | Status: DC

## 2014-03-13 MED ORDER — FENTANYL 25 MCG/HR TD PT72: 25.0000 ug | MEDICATED_PATCH | TRANSDERMAL | Status: DC

## 2014-03-13 MED ORDER — OXYCODONE HCL 5 MG PO TABS: 5.0000 mg | ORAL_TABLET | Freq: Every day | ORAL | Status: DC

## 2014-03-13 NOTE — Progress Notes (Signed)
Subjective:    Patient ID: Christine Bradshaw, female    DOB: 06/04/1965, 49 y.o.   MRN: [id number]  HPI: [name] is a 49 year old female who returns for follow up for chronic pain and medication refill. She says her pain is located in her lower back and bilateral hips.Also states " her PCP discontinued her diclofenac due to the possibility of heart adverse effects, since then her pain has intensified. Also wanted me to prescribe her an " arthritis med". I explained all medications for arthritis are ant-inflammatories. She verbalized understanding. Encouraged to speak to her PCP she verbalizes understanding. Will prescribe a steroid pack for acute exacerbation of pain and arthritis she verbalizes understanding.  She rates her pain 7. Her current exercise regime is performing chair and stretching exercises and walking with her walker for short distances.   Pain Inventory Average Pain 6 Pain Right Now 7 My pain is sharp, burning and aching  In the last 24 hours, has pain interfered with the following? General activity 6 Relation with others 6 Enjoyment of life 8 What TIME of day is your pain at its worst? daytime, night Sleep (in general) Fair  Pain is worse with: walking, bending, sitting, standing and some activites Pain improves with: rest, heat/ice, medication and injections Relief from Meds: 5  Mobility walk with assistance use a walker how many minutes can you walk? 4 ability to climb steps?  yes do you drive?  no use a wheelchair transfers alone Do you have any goals in this area?  yes  Function I need assistance with the following:  bathing, meal prep, household duties and shopping  Neuro/Psych weakness trouble walking spasms depression anxiety  Prior Studies Any changes since last visit?  no  Physicians involved in your care Any changes since last visit?  no   Family History  Problem Relation Age of Onset  . Hyperlipidemia Father   .  Hypertension Father   . Heart disease Father   . Colon polyps Neg Hx    History   Social History  . Marital Status: Married    Spouse Name: N/A  . Number of Children: N/A  . Years of Education: N/A   Occupational History  . disability    Social History Main Topics  . Smoking status: Current Every Day Smoker -- 1.50 packs/day    Types: Cigarettes  . Smokeless tobacco: Never Used  . Alcohol Use: No  . Drug Use: No  . Sexual Activity:    Partners: Male   Other Topics Concern  . None   Social History Narrative   Husband on disability   Pt also on disability   Past Surgical History  Procedure Laterality Date  . Tonsillectomy    . Cholecystectomy    . Appendectomy    . Esophagogastroduodenoscopy  2010    showed erosions  . Colonoscopy w/ biopsies  2010    adenomatous polyp repeat 2015  . Lumbar fusion  2011    L4-L5-S1 Dr. Marcial Pacas  . Abdominal hysterectomy    . Colonoscopy with propofol N/A 03/13/2013    Procedure: COLONOSCOPY WITH PROPOFOL;  Surgeon: Lafayette Dragon, MD;  Location: [facility];  Service: Endoscopy;  Laterality: N/A;   Past Medical History  Diagnosis Date  . Hypertension   . Depression   . Irritable bowel syndrome   . Diabetes mellitus type II   . Panic attack as reaction to stress   . Insomnia  03-08-13"states is in control"  . Tremor   . Memory loss of unknown cause   . Back pain, chronic   . HX OF GALLSTONE 04/21/2008    Qualifier: Diagnosis of  By: Julaine Hua CMA ([place]), Estill Bamberg    . HEADACHE, CHRONIC 04/21/2008  . Complication of anesthesia     manic episode  . GERD (gastroesophageal reflux disease)   . H/O hiatal hernia   . Arthritis    There were no vitals taken for this visit.  Opioid Risk Score:   Fall Risk Score: Moderate Fall Risk (6-13 points)  Review of Systems  Musculoskeletal: Positive for gait problem.  Neurological: Positive for weakness.       Spasms  Psychiatric/Behavioral: Positive for dysphoric mood. The patient is  nervous/anxious.   All other systems reviewed and are negative.      Objective:   Physical Exam  Constitutional: She is oriented to person, place, and time. She appears well-developed and well-nourished.  HENT:  Head: Normocephalic and atraumatic.  Neck: Normal range of motion. Neck supple.  Cardiovascular: Normal rate and regular rhythm.   Pulmonary/Chest: Effort normal and breath sounds normal.  Musculoskeletal:  Normal Muscle Bulk and Muscle Testing Reveals: Upper Extremities: Full ROM and Muscle Strength 5/5 Thoracic Hypersensitivity T-7- T-12 Lumbar Hypersensitivity Lower Extremities: Right Full ROM and Muscle strength 5/5 Left: Decreased ROM and Muscle Strength 4/5 Left Lower Extremity Flexion Produces Pain into Hip Arises from chair with difficulty using walker for support Antalgic Gait  Neurological: She is alert and oriented to person, place, and time.  Skin: Skin is warm and dry.  Psychiatric: She has a normal mood and affect.  Nursing note and vitals reviewed.         Assessment & Plan:  1. Lumbar postlaminectomy syndrome- status post L4-5 and L5-S1 fusion with chronic postoperative pain as well as a chronic left L5 radiculopathy. Schedule for Left Radiofrequency Neurotomy in April Schedule for Right Radiofrequency Neurotomy in May Refilled: Fentanyl Patch 25 mcg one patch every three days #10 and Oxycodone 5 mg daily as needed  #50.  Continue with heat and exercise therapy. 2. Lumbar spondylosis above the level of the fusion: Schedule for Left Radiofrequency Neurotomy in April Schedule for Right Radiofrequency Neurotomy in May  3. Myoclonic Disorder: F/U Neurology 03/26/14  20 minutes of face to face patient care time was spent during this visit. All questions was encouraged and answered.   F/U in 1 month.

## 2014-03-14 LAB — PMP ALCOHOL METABOLITE (ETG): ETGU: NEGATIVE ng/mL

## 2014-03-16 LAB — BENZODIAZEPINES (GC/LC/MS), URINE
Alprazolam metabolite (GC/LC/MS), ur confirm: NEGATIVE ng/mL (ref <25)
Clonazepam metabolite (GC/LC/MS), ur confirm: NEGATIVE ng/mL (ref <25)
Flurazepam metabolite (GC/LC/MS), ur confirm: NEGATIVE ng/mL (ref <50)
Lorazepam (GC/LC/MS), ur confirm: NEGATIVE ng/mL (ref <50)
MIDAZOLAMU: NEGATIVE ng/mL (ref <50)
NORDIAZEPAMU: NEGATIVE ng/mL (ref <50)
Oxazepam (GC/LC/MS), ur confirm: NEGATIVE ng/mL (ref <50)
TRIAZOLAMU: NEGATIVE ng/mL (ref <50)
Temazepam (GC/LC/MS), ur confirm: NEGATIVE ng/mL (ref <50)

## 2014-03-16 LAB — OPIATES/OPIOIDS (LC/MS-MS)
Codeine Urine: NEGATIVE ng/mL (ref <50)
HYDROCODONE: NEGATIVE ng/mL (ref <50)
HYDROMORPHONE: NEGATIVE ng/mL (ref <50)
Morphine Urine: NEGATIVE ng/mL (ref <50)
Norhydrocodone, Ur: NEGATIVE ng/mL (ref <50)
Noroxycodone, Ur: 2208 ng/mL (ref <50)
Oxycodone, ur: 753 ng/mL (ref <50)
Oxymorphone: 716 ng/mL (ref <50)

## 2014-03-16 LAB — FENTANYL (GC/LC/MS), URINE
Fentanyl, confirm: 16.6 ng/mL (ref <0.5)
Norfentanyl, confirm: 120.8 ng/mL (ref <0.5)

## 2014-03-16 LAB — OXYCODONE, URINE (LC/MS-MS)
Noroxycodone, Ur: 2208 ng/mL (ref <50)
OXYCODONE, UR: 753 ng/mL (ref <50)
Oxymorphone: 716 ng/mL (ref <50)

## 2014-03-16 LAB — AMPHETAMINES (GC/LC/MS), URINE
Amphetamine GC/MS Conf: NEGATIVE ng/mL (ref <250)
METHAMPHETAMINE QUANT UR: NEGATIVE ng/mL (ref <250)

## 2014-03-18 LAB — PRESCRIPTION MONITORING PROFILE (SOLSTAS)
BUPRENORPHINE, URINE: NEGATIVE ng/mL
Barbiturate Screen, Urine: NEGATIVE ng/mL
CARISOPRODOL, URINE: NEGATIVE ng/mL
COCAINE METABOLITES: NEGATIVE ng/mL
Cannabinoid Scrn, Ur: NEGATIVE ng/mL
Creatinine, Urine: 258.92 mg/dL (ref >20.0)
MDMA URINE: NEGATIVE ng/mL
Meperidine, Ur: NEGATIVE ng/mL
Methadone Screen, Urine: NEGATIVE ng/mL
Nitrites, Initial: NEGATIVE ug/mL
PH URINE, INITIAL: 5.3 pH (ref 4.5–8.9)
Propoxyphene: NEGATIVE ng/mL
Tapentadol, urine: NEGATIVE ng/mL
Tramadol Scrn, Ur: NEGATIVE ng/mL
ZOLPIDEM, URINE: NEGATIVE ng/mL

## 2014-03-21 ENCOUNTER — Telehealth: Payer: Self-pay | Admitting: Family Medicine

## 2014-03-21 NOTE — Telephone Encounter (Signed)
Pt called because her new dosage on her insulin is not helping keep her blood sugars in line. They are all over the place. She would like to increase this to 40 if possible. Please call to advise. jw

## 2014-03-21 NOTE — Telephone Encounter (Signed)
Spoke with patient, she states that her fasting blood sugars have been ranging between 145-200 since MD changed her medication. She states she use to always run between 90-120 and and that she believes she should increase levemir to 40 units. Informed patient I would send message to PCP and give her a call back with response.

## 2014-03-21 NOTE — Telephone Encounter (Signed)
Pt can increased levemir to 35U nightly. If fasting sugars are still greater than 130, can increased to 40U nightly.  Algis Greenhouse. Jerline Pain, [place] Resident PGY-1 03/21/2014 11:33 AM

## 2014-03-21 NOTE — Telephone Encounter (Signed)
Pt is aware of message and voiced understanding on instructions. Jazmin Hartsell,CMA

## 2014-03-21 NOTE — Progress Notes (Signed)
Urine drug screen for this encounter is consistent for prescribed medication 

## 2014-03-21 NOTE — Telephone Encounter (Signed)
Will forward to the clinic nurse for this morning.  Norvin Ohlin,CMA

## 2014-03-24 ENCOUNTER — Telehealth: Payer: Self-pay | Admitting: Family Medicine

## 2014-03-24 MED ORDER — INSULIN DETEMIR 100 UNIT/ML FLEXPEN: 35.0000 [IU] | PEN_INJECTOR | Freq: Every day | SUBCUTANEOUS | Status: DC

## 2014-03-24 NOTE — Telephone Encounter (Signed)
Pt calling in regards to message on Friday about increasing medications, would like for it to be sent through mail order Lantus, and she needs a 3 month supply per medication refill this way, cheaper for pt to receive medications this way. Needs someone to call [facility] [phone] and put the order in for her. Please call pt to inform her that it has been completed. Thanks [employer], ASA

## 2014-03-24 NOTE — Telephone Encounter (Signed)
Will forward to MD write a new rx for increased dose.  Will then try and call "[facility]" to see how we call this medication in for patient. Jazmin Hartsell,CMA

## 2014-03-25 MED ORDER — INSULIN GLARGINE 100 UNIT/ML SOLOSTAR PEN: 40.0000 [IU] | PEN_INJECTOR | Freq: Every day | SUBCUTANEOUS | Status: DC

## 2014-03-25 NOTE — Telephone Encounter (Signed)
Spoke with pt and she stated that she wants to switch back to using Lantus as Levemir did not help. She stated that when she was taking Levemir her BS readings were higher and she even increased her dose up to 40units. Her readings while taking Levemir ranged from 130s-270s (fasting and non-fasting). She stated that she has always been on Lantus and it works for her. She stated she still had some Lantus left and used recently and her BS readings were better ranging from 110s-120s (fasting and non-fasting). She used 140units? for Lantus and would like to continue using Lantus. [facility] stated previously. Please advise. Christine Bradshaw, CMA.

## 2014-03-25 NOTE — Telephone Encounter (Signed)
Rx for Lantus 40-50 units nightly sent into [facility] pharmacy. I would like for her to start back at 40 units. If her fasting blood sugar readings are still in the 130 or higher range, she can increase to 45 units. If she is still concerned about her blood sugar readings, she should come in for another visit.  Algis Greenhouse. Jerline Pain, [place] Resident PGY-1 03/25/2014 8:29 PM

## 2014-03-26 ENCOUNTER — Encounter: Payer: Self-pay | Admitting: Neurology

## 2014-03-26 ENCOUNTER — Ambulatory Visit (INDEPENDENT_AMBULATORY_CARE_PROVIDER_SITE_OTHER): Payer: No Typology Code available for payment source | Admitting: Neurology

## 2014-03-26 VITALS — BP 90/60 | HR 111 | Resp 18 | Ht 64.0 in | Wt 258.0 lb

## 2014-03-26 DIAGNOSIS — R404 Transient alteration of awareness: Secondary | ICD-10-CM | POA: Diagnosis not present
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
## 2014-03-26 NOTE — Patient Instructions (Signed)
1. Schedule MRI brain with and without contrast 2. Routine EEG, then plan for 48-hour EEG 3. Discuss Cardiology evaluation with your PCP 4. Follow-up after the tests

## 2014-03-26 NOTE — Telephone Encounter (Signed)
LM on identified VM. Miroslava Santellan,CMA  

## 2014-03-27 ENCOUNTER — Ambulatory Visit (INDEPENDENT_AMBULATORY_CARE_PROVIDER_SITE_OTHER): Payer: No Typology Code available for payment source | Admitting: Neurology

## 2014-03-27 DIAGNOSIS — R404 Transient alteration of awareness: Secondary | ICD-10-CM

## 2014-03-31 NOTE — Procedures (Signed)
ELECTROENCEPHALOGRAM REPORT  Date of Study: 03/27/2014  Patient's Name: Christine Bradshaw MRN: [id number] Date of Birth: 1965-05-06  Referring Provider: Dr. Ellouise Newer  Clinical History: This is a 49 year old woman with recurrent episodes of unresponsiveness and going limp.   Medications: Clonazepam, Neurontin, Lamictal, Trazodone  Technical Summary: A multichannel digital EEG recording measured by the international 10-20 system with electrodes applied with paste and impedances below 5000 ohms performed in our laboratory with EKG monitoring in an awake and asleep patient.  Hyperventilation and photic stimulation were performed.  The digital EEG was referentially recorded, reformatted, and digitally filtered in a variety of bipolar and referential montages for optimal display.    Description: The patient is awake and asleep during the recording.  During maximal wakefulness, there is a symmetric, medium voltage 9 Hz posterior dominant rhythm that attenuates with eye opening.  The record is symmetric.  During drowsiness and sleep, there is an increase in theta slowing of the background.  Vertex waves and symmetric sleep spindles were seen.  Hyperventilation and photic stimulation did not elicit any abnormalities.  There were no epileptiform discharges or electrographic seizures seen.    EKG lead was unremarkable.  Impression: This awake and asleep EEG is normal.    Clinical Correlation: A normal EEG does not exclude a clinical diagnosis of epilepsy.  If further clinical questions remain, prolonged EEG may be helpful.  Clinical correlation is advised.   Ellouise Newer, M.D.

## 2014-04-01 ENCOUNTER — Ambulatory Visit
Admission: RE | Admit: 2014-04-01 | Discharge: 2014-04-01 | Disposition: A | Discharge status: Home / Self Care | Payer: No Typology Code available for payment source | Source: Ambulatory Visit | Attending: Neurology | Admitting: Neurology

## 2014-04-01 ENCOUNTER — Telehealth: Payer: Self-pay | Admitting: Family Medicine

## 2014-04-01 MED ORDER — GADOBENATE DIMEGLUMINE 529 MG/ML IV SOLN
20.0000 mL | Freq: Once | INTRAVENOUS | Status: AC | PRN
  Administered 2014-04-01: 20 mL via INTRAVENOUS

## 2014-04-01 NOTE — Telephone Encounter (Signed)
Patient was notified of result. 48 Hour EEG is scheduled for May.

## 2014-04-01 NOTE — Telephone Encounter (Signed)
-----   Message from Cameron Sprang, MD sent at 03/31/2014  4:41 PM EDT ----- Pls let her know routine EEG is normal, would proceed with 48-hour EEG as discussed. Thanks

## 2014-04-01 NOTE — Telephone Encounter (Signed)
Patient was notified of results.  

## 2014-04-01 NOTE — Telephone Encounter (Signed)
-----   Message from Cameron Sprang, MD sent at 04/01/2014  3:27 PM EDT ----- Pls let her know I reviewed MRI brain, no evidence of tumor, stroke, or bleed. Thanks

## 2014-04-02 ENCOUNTER — Encounter: Payer: Self-pay | Admitting: Neurology

## 2014-04-02 DIAGNOSIS — R404 Transient alteration of awareness: Secondary | ICD-10-CM | POA: Insufficient documentation

## 2014-04-11 ENCOUNTER — Ambulatory Visit: Payer: No Typology Code available for payment source | Admitting: Physical Medicine & Rehabilitation

## 2014-04-15 ENCOUNTER — Encounter: Payer: No Typology Code available for payment source | Attending: Physical Medicine & Rehabilitation

## 2014-04-15 ENCOUNTER — Encounter: Payer: Self-pay | Admitting: Physical Medicine & Rehabilitation

## 2014-04-15 ENCOUNTER — Ambulatory Visit (HOSPITAL_BASED_OUTPATIENT_CLINIC_OR_DEPARTMENT_OTHER): Payer: No Typology Code available for payment source | Admitting: Physical Medicine & Rehabilitation

## 2014-04-15 VITALS — BP 118/80 | HR 71 | Resp 14

## 2014-04-15 DIAGNOSIS — M47816 Spondylosis without myelopathy or radiculopathy, lumbar region: Secondary | ICD-10-CM

## 2014-04-15 MED ORDER — OXYCODONE HCL 5 MG PO TABS: 5.0000 mg | ORAL_TABLET | Freq: Every day | ORAL | Status: DC

## 2014-04-15 MED ORDER — GABAPENTIN 600 MG PO TABS: 600.0000 mg | ORAL_TABLET | Freq: Three times a day (TID) | ORAL | Status: DC

## 2014-04-15 MED ORDER — FENTANYL 25 MCG/HR TD PT72: 25.0000 ug | MEDICATED_PATCH | TRANSDERMAL | Status: DC

## 2014-04-15 NOTE — Progress Notes (Signed)
  PROCEDURE RECORD [facility]   Name: Christine Bradshaw DOB:May 28, 1965 MRN: [id number]  Date:04/15/2014  Physician: Alysia Penna, MD    Nurse/CMA: Mancel Parsons  Allergies:  Allergies  Allergen Reactions  . Abilify [Aripiprazole]     Shortness of breath, cramps, shakes   . Lithium     Kidneys stop    Consent Signed: Yes.    Is patient diabetic? Yes.    CBG today? 103  Pregnant: No. LMP: No LMP recorded. Patient has had a hysterectomy. (age [age])  Anticoagulants: no Anti-inflammatory: no Antibiotics: no  Procedure: left radiofrequency neurotomy Position: Prone Start Time:1:50 pm   End Time: 2:09pm Fluoro Time: 40  RN/CMA Rolan Bucco Denita Lun    Time 1:20 pm 2:14pm    BP 118/80 121/76    Pulse 73 77    Respirations 14 14    O2 Sat 98 94    S/S 6 6    Pain Level 7/10 5/10     D/C home with husband Christine Bradshaw, [name] & O X 3, D/C instructions reviewed, and sits independently.

## 2014-04-15 NOTE — Patient Instructions (Signed)

## 2014-04-15 NOTE — Progress Notes (Signed)
Left T12,L1,L2 medial branch radio frequency neurotomy under fluoroscopic guidance   Indication: Low back pain due to lumbar spondylosis which has been relieved on 2 occasions by greater than 50% by lumbar medial branch blocks at corresponding levels.  Informed consent was obtained after describing risks and benefits of the procedure with the patient, this includes bleeding, bruising, infection, paralysis and medication side effects. The patient wishes to proceed and has given written consent. The patient was placed in a prone position. The lumbar and sacral area was marked and prepped with Betadine. A 25-gauge 1-1/2 inch needle was inserted into the skin and subcutaneous tissue at 3 sites in one ML of 1% lidocaine was injected into each site. Then a 20-gauge 15cm cm radio frequency needle with a 1 cm curved active tip was inserted targeting the Left L3 SAP/transverse process junction. Bone contact was made and confirmed with lateral imaging. Sensory stimulation at 50 Hz followed by motor stimulation at 2 Hz confirm proper needle location followed by injection of one ML of the solution containing one ML of 4 mg per mL dexamethasone and 3 mL of 1% MPF lidocaine. Then the Left L2 SAP/transverse process junction was targeted. Bone contact was made and confirmed with lateral imaging. Sensory stimulation at 50 Hz followed by motor stimulation at 2 Hz confirm proper needle location followed by injection of one ML of the solution containing one ML of 4 mg per mL dexamethasone and 3 mL of 1% MPF lidocaine. Then the Left L1 SAP/transverse process junction was targeted. Bone contact was made and confirmed with lateral imaging. Sensory stimulation at 50 Hz followed by motor stimulation at 2 Hz confirm proper needle location followed by injection of one ML of the solution containing one ML of 4 mg per mL dexamethasone and 3 mL of 1% MPF lidocaine. Radio frequency lesion being at [facility] for 90 seconds was performed. Needles  were removed. Post procedure instructions and vital signs were performed. Patient tolerated procedure well. Followup appointment was given.

## 2014-04-16 ENCOUNTER — Other Ambulatory Visit: Payer: No Typology Code available for payment source

## 2014-04-22 ENCOUNTER — Other Ambulatory Visit: Payer: Self-pay | Admitting: Family Medicine

## 2014-04-28 ENCOUNTER — Telehealth: Payer: Self-pay | Admitting: Family Medicine

## 2014-04-28 NOTE — Telephone Encounter (Signed)
Needs  Approval for nexium submitted to  [facility]  [phone] Also needs refill on lovaza

## 2014-04-28 NOTE — Telephone Encounter (Signed)
Will forward to RN team to see if PA has been received for this medication and will also forward to MD to refill lovaza.  Cesar Alf,CMA

## 2014-04-28 NOTE — Telephone Encounter (Signed)
Christine Bradshaw was called to request prior authorization form for Nexium.  Representative stated it could take 5-10 minutes to received fax.  Derl Barrow, RN

## 2014-04-29 MED ORDER — OMEPRAZOLE 40 MG PO CPDR
40.0000 mg | DELAYED_RELEASE_CAPSULE | Freq: Every day | ORAL | Status: DC
Start: 2014-04-29 — End: 2014-10-21

## 2014-04-29 MED ORDER — OMEGA-3-ACID ETHYL ESTERS 1 G PO CAPS: 2.0000 g | ORAL_CAPSULE | Freq: Two times a day (BID) | ORAL | Status: DC

## 2014-04-29 NOTE — Addendum Note (Signed)
Addended by: Vivi Barrack on: 04/29/2014 01:45 PM   Modules accepted: Orders

## 2014-04-29 NOTE — Telephone Encounter (Signed)
Sent in Rx for formulary alternative (omeprazole).  Algis Greenhouse. Jerline Pain, [place] Resident PGY-1 04/29/2014 1:45 PM

## 2014-04-29 NOTE — Telephone Encounter (Signed)
Did patient happen to mention which medication she wants [place] to call about?  There is an approved medication for her BP in this message from 04/23/14 that was sent to pharmacy. Jazmin Hartsell,CMA

## 2014-04-29 NOTE — Telephone Encounter (Signed)
PA form for Nexium placed in provider box for review and completion.  Derl Barrow, RN

## 2014-04-29 NOTE — Telephone Encounter (Signed)
Needs fish oil RX called in

## 2014-04-29 NOTE — Telephone Encounter (Signed)
Patient is aware of this. Jazmin Hartsell,CMA  

## 2014-04-29 NOTE — Telephone Encounter (Signed)
This medication was sent in today. Balthazar Dooly,CMA

## 2014-04-29 NOTE — Telephone Encounter (Signed)
Spoke with Reino Bellis and patient would like notification when her nexium is called in.  PA is waiting in provider's box for completion. Jazmin Hartsell,CMA

## 2014-04-29 NOTE — Telephone Encounter (Signed)
Please call when the RX has been sent in

## 2014-05-12 ENCOUNTER — Other Ambulatory Visit: Payer: No Typology Code available for payment source

## 2014-05-15 ENCOUNTER — Encounter: Payer: No Typology Code available for payment source | Attending: Physical Medicine & Rehabilitation

## 2014-05-15 ENCOUNTER — Encounter: Payer: Self-pay | Admitting: Physical Medicine & Rehabilitation

## 2014-05-15 ENCOUNTER — Ambulatory Visit (HOSPITAL_BASED_OUTPATIENT_CLINIC_OR_DEPARTMENT_OTHER): Payer: No Typology Code available for payment source | Admitting: Physical Medicine & Rehabilitation

## 2014-05-15 VITALS — BP 112/64 | HR 94 | Resp 14

## 2014-05-15 DIAGNOSIS — M47816 Spondylosis without myelopathy or radiculopathy, lumbar region: Secondary | ICD-10-CM

## 2014-05-15 MED ORDER — GABAPENTIN 600 MG PO TABS: 600.0000 mg | ORAL_TABLET | Freq: Three times a day (TID) | ORAL | Status: DC

## 2014-05-15 MED ORDER — OXYCODONE HCL 5 MG PO TABS: 5.0000 mg | ORAL_TABLET | Freq: Every day | ORAL | Status: DC

## 2014-05-15 MED ORDER — FENTANYL 25 MCG/HR TD PT72: 25.0000 ug | MEDICATED_PATCH | TRANSDERMAL | Status: DC

## 2014-05-15 NOTE — Progress Notes (Signed)
  PROCEDURE RECORD [facility]   Name: Christine Bradshaw DOB:Dec 15, 1965 MRN: [id number]  Date:05/15/2014  Physician: Alysia Penna, MD    Nurse/CMA: Wenda Overland  Allergies:  Allergies  Allergen Reactions  . Abilify [Aripiprazole]     Shortness of breath, cramps, shakes   . Lithium     Kidneys stop    Consent Signed: Yes.    Is patient diabetic? Yes.    CBG today? 65  Pregnant: No. LMP: No LMP recorded. Patient has had a hysterectomy. (age 20-55)  Anticoagulants: no Anti-inflammatory: no Antibiotics: no  Procedure: Radiofrequency Neurotomy right side     Position: Prone Start Time: 1:45 End Time: 2:02 Fluoro Time: 25 sec  RN/CMA Mancel Parsons Sybil Shumaker    Time 1:20 pm 2:08    BP 112/64 118/69    Pulse 94 88    Respirations 14 14    O2 Sat 94 95    S/S 6 6    Pain Level 7/10 --     D/C home with Christine Bradshaw (daughter), [name] & O X 3, D/C instructions reviewed, and sits independently.

## 2014-05-15 NOTE — Progress Notes (Signed)
Right T12,L1,L2 medial branch radio frequency neuropathy under fluoroscopic guidance   Indication: Low back pain due to lumbar spondylosis which has been relieved on 2 occasions by greater than 50% by lumbar medial branch blocks at corresponding levels.  Informed consent was obtained after describing risks and benefits of the procedure with the patient, this includes bleeding, bruising, infection, paralysis and medication side effects. The patient wishes to proceed and has given written consent. The patient was placed in a prone position. The lumbar and sacral area was marked and prepped with Betadine. A 25-gauge 1-1/2 inch needle was inserted into the skin and subcutaneous tissue at 3 sites in one ML of 1% lidocaine was injected into each site. Then a 20-gauge 15cm cm radio frequency needle with a 1 cm curved active tip was inserted targeting the Right L3 SAP/transverse process junction. Bone contact was made and confirmed with lateral imaging. Sensory stimulation at 50 Hz followed by motor stimulation at 2 Hz confirm proper needle location followed by injection of one ML of the solution containing one ML of 4 mg per mL dexamethasone and 3 mL of 1% MPF lidocaine. Then the Right L2 SAP/transverse process junction was targeted. Bone contact was made and confirmed with lateral imaging. Sensory stimulation at 50 Hz followed by motor stimulation at 2 Hz confirm proper needle location followed by injection of one ML of the solution containing one ML of 4 mg per mL dexamethasone and 3 mL of 1% MPF lidocaine. Then the Right L1 SAP/transverse process junction was targeted. Bone contact was made and confirmed with lateral imaging. Sensory stimulation at 50 Hz followed by motor stimulation at 2 Hz confirm proper needle location followed by injection of one ML of the solution containing one ML of 4 mg per mL dexamethasone and 3 mL of 1% MPF lidocaine. Radio frequency lesion being at [facility] for 90 seconds was performed.  Needles were removed. Post procedure instructions and vital signs were performed. Patient tolerated procedure well. Followup appointment was given.

## 2014-05-15 NOTE — Patient Instructions (Signed)
You had a radio frequency procedure today This was done to alleviate joint pain in your lumbar area We injected a combination of dexamethasone which is a steroid as well as lidocaine which is a local anesthetic. Dexamethasone made increased blood sugars you are diabetic You may experience soreness at the injection sites. You may also experienced some irritation of the nerves that were heated I'm recommending ice for 30 minutes every 2 hours as needed for the next 24-48 hours In addition he will be taking gabapentin 600 mg 3 times a day, reduce to once or twice a day if excess sedation

## 2014-05-16 ENCOUNTER — Other Ambulatory Visit: Payer: Self-pay | Admitting: Registered Nurse

## 2014-05-19 ENCOUNTER — Telehealth: Payer: Self-pay | Admitting: Family Medicine

## 2014-05-19 NOTE — Telephone Encounter (Signed)
Rx for fish oil was written for 100 pills, she needs 120. She only has 3 days left Please sent to [facility] on [address] Please notify when it is sent

## 2014-05-19 NOTE — Telephone Encounter (Signed)
Refill request. Will forward to PCP for review. Fard Borunda, CMA. 

## 2014-05-20 ENCOUNTER — Other Ambulatory Visit: Payer: Self-pay | Admitting: Family Medicine

## 2014-05-20 MED ORDER — OMEGA-3-ACID ETHYL ESTERS 1 G PO CAPS: 2.0000 g | ORAL_CAPSULE | Freq: Two times a day (BID) | ORAL | Status: DC

## 2014-05-20 NOTE — Telephone Encounter (Signed)
Rx sent in  [place]. Jerline Pain, [place] Resident PGY-1 05/20/2014 1:37 PM

## 2014-05-20 NOTE — Telephone Encounter (Signed)
Rx filled.  Algis Greenhouse. Jerline Pain, [place] Resident PGY-1 05/20/2014 2:41 PM

## 2014-06-09 ENCOUNTER — Other Ambulatory Visit: Payer: No Typology Code available for payment source

## 2014-06-17 ENCOUNTER — Encounter: Payer: Self-pay | Admitting: Internal Medicine

## 2014-06-26 ENCOUNTER — Ambulatory Visit: Payer: No Typology Code available for payment source | Admitting: Physical Medicine & Rehabilitation

## 2014-06-30 ENCOUNTER — Ambulatory Visit: Payer: No Typology Code available for payment source | Admitting: Neurology

## 2014-07-03 ENCOUNTER — Encounter: Payer: No Typology Code available for payment source | Attending: Physical Medicine & Rehabilitation

## 2014-07-03 ENCOUNTER — Ambulatory Visit (HOSPITAL_BASED_OUTPATIENT_CLINIC_OR_DEPARTMENT_OTHER): Payer: No Typology Code available for payment source | Admitting: Physical Medicine & Rehabilitation

## 2014-07-03 ENCOUNTER — Encounter: Payer: Self-pay | Admitting: Physical Medicine & Rehabilitation

## 2014-07-03 ENCOUNTER — Other Ambulatory Visit: Payer: Self-pay | Admitting: Physical Medicine & Rehabilitation

## 2014-07-03 VITALS — BP 129/80 | HR 97 | Resp 14

## 2014-07-03 DIAGNOSIS — Z79899 Other long term (current) drug therapy: Secondary | ICD-10-CM

## 2014-07-03 DIAGNOSIS — G8929 Other chronic pain: Secondary | ICD-10-CM

## 2014-07-03 DIAGNOSIS — M47816 Spondylosis without myelopathy or radiculopathy, lumbar region: Secondary | ICD-10-CM | POA: Diagnosis not present

## 2014-07-03 DIAGNOSIS — Z5181 Encounter for therapeutic drug level monitoring: Secondary | ICD-10-CM | POA: Diagnosis not present

## 2014-07-03 DIAGNOSIS — G894 Chronic pain syndrome: Secondary | ICD-10-CM | POA: Diagnosis not present

## 2014-07-03 DIAGNOSIS — M961 Postlaminectomy syndrome, not elsewhere classified: Secondary | ICD-10-CM | POA: Diagnosis not present

## 2014-07-03 DIAGNOSIS — M545 Low back pain, unspecified: Secondary | ICD-10-CM

## 2014-07-03 MED ORDER — OXYCODONE HCL 5 MG PO TABS: 5.0000 mg | ORAL_TABLET | Freq: Three times a day (TID) | ORAL | Status: DC

## 2014-07-03 MED ORDER — FENTANYL 25 MCG/HR TD PT72: 25.0000 ug | MEDICATED_PATCH | TRANSDERMAL | Status: DC

## 2014-07-03 NOTE — Patient Instructions (Signed)
Trochanteric Bursitis You have hip pain due to trochanteric bursitis. Bursitis means that the sack near the outside of the hip is filled with fluid and inflamed. This sack is made up of protective soft tissue. The pain from trochanteric bursitis can be severe and keep you from sleep. It can radiate to the buttocks or down the outside of the thigh to the knee. The pain is almost always worse when rising from the seated or lying position and with walking. Pain can improve after you take a few steps. It happens more often in people with hip joint and lumbar spine problems, such as arthritis or previous surgery. Very rarely the trochanteric bursa can become infected, and antibiotics and/or surgery may be needed. Treatment often includes an injection of local anesthetic mixed with cortisone medicine. This medicine is injected into the area where it is most tender over the hip. Repeat injections may be necessary if the response to treatment is slow. You can apply ice packs over the tender area for 30 minutes every 2 hours for the next few days. Anti-inflammatory and/or narcotic pain medicine may also be helpful. Limit your activity for the next few days if the pain continues. See your caregiver in 5-10 days if you are not greatly improved.  SEEK IMMEDIATE MEDICAL CARE IF:  You develop severe pain, fever, or increased redness.  You have pain that radiates below the knee. EXERCISES STRETCHING EXERCISES - Trochanteric Bursitis  These exercises may help you when beginning to rehabilitate your injury. Your symptoms may resolve with or without further involvement from your physician, physical therapist, or athletic trainer. While completing these exercises, remember:   Restoring tissue flexibility helps normal motion to return to the joints. This allows healthier, less painful movement and activity.  An effective stretch should be held for at least 30 seconds.  A stretch should never be painful. You should only  feel a gentle lengthening or release in the stretched tissue. STRETCH - Iliotibial Band  On the floor or bed, lie on your side so your injured leg is on top. Bend your knee and grab your ankle.  Slowly bring your knee back so that your thigh is in line with your trunk. Keep your heel at your buttocks and gently arch your back so your head, shoulders and hips line up.  Slowly lower your leg so that your knee approaches the floor/bed until you feel a gentle stretch on the outside of your thigh. If you do not feel a stretch and your knee will not fall farther, place the heel of your opposite foot on top of your knee and pull your thigh down farther.  Hold this stretch for __________ seconds.  Repeat __________ times. Complete this exercise __________ times per day. STRETCH - Hamstrings, Supine   Lie on your back. Loop a belt or towel over the ball of your foot as shown.  Straighten your knee and slowly pull on the belt to raise your injured leg. Do not allow the knee to bend. Keep your opposite leg flat on the floor.  Raise the leg until you feel a gentle stretch behind your knee or thigh. Hold this position for __________ seconds.  Repeat __________ times. Complete this stretch __________ times per day. STRETCH - Quadriceps, Prone   Lie on your stomach on a firm surface, such as a bed or padded floor.  Bend your knee and grasp your ankle. If you are unable to reach your ankle or pant leg, use a belt   around your foot to lengthen your reach.  Gently pull your heel toward your buttocks. Your knee should not slide out to the side. You should feel a stretch in the front of your thigh and/or knee.  Hold this position for __________ seconds.  Repeat __________ times. Complete this stretch __________ times per day. STRETCHING - Hip Flexors, Lunge Half kneel with your knee on the floor and your opposite knee bent and directly over your ankle.  Keep good posture with your head over your  shoulders. Tighten your buttocks to point your tailbone downward; this will prevent your back from arching too much.  You should feel a gentle stretch in the front of your thigh and/or hip. If you do not feel any resistance, slightly slide your opposite foot forward and then slowly lunge forward so your knee once again lines up over your ankle. Be sure your tailbone remains pointed downward.  Hold this stretch for __________ seconds.  Repeat __________ times. Complete this stretch __________ times per day. STRETCH - Adductors, Lunge  While standing, spread your legs.  Lean away from your injured leg by bending your opposite knee. You may rest your hands on your thigh for balance.  You should feel a stretch in your inner thigh. Hold for __________ seconds.  Repeat __________ times. Complete this exercise __________ times per day. Document Released: 01/28/2004 Document Revised: 05/06/2013 Document Reviewed: 04/03/2008 ExitCare Patient Information 2015 [employer]. This information is not intended to replace advice given to you by your health care provider. Make sure you discuss any questions you have with your health care provider.  

## 2014-07-03 NOTE — Progress Notes (Signed)
Subjective:    Patient ID: Christine Bradshaw, female    DOB: [birth year], 49 y.o.   MRN: [id number]  HPI 49 year old female with chronic low back pain. She is status post L4-5 and L5-S1 laminectomy and fusion in 2011. Postoperative myelogram in 2013 demonstrated intact fusion and no significant adjacent level disease. Patient had greater than 50% relief on 2 occasions following T12-L1 and L2 medial branch blocks however after radiofrequency procedure her pain returned. Her pain radiates into her hips and into her thigh but not below the knee. She has no new bowel or bladder dysfunction Pain is interfering with activities of daily living.  She is also complaining of right greater than left-sided hip pain. She has difficulty sleeping on her side.  Her other past medical history significant for psychiatric disease, Anxiety and depression but is also on Risperdal which is an antipsychotic agent Pain Inventory Average Pain 7 Pain Right Now 8 My pain is constant, sharp, burning, stabbing and aching  In the last 24 hours, has pain interfered with the following? General activity 8 Relation with others 8 Enjoyment of life 8 What TIME of day is your pain at its worst? morning, daytime and night Sleep (in general) Fair  Pain is worse with: walking, sitting, standing and some activites Pain improves with: heat/ice and medication Relief from Meds: 5  Mobility use a walker how many minutes can you walk? 5 ability to climb steps?  yes do you drive?  no  Function disabled: date disabled .  Neuro/Psych bladder control problems numbness spasms depression anxiety  Prior Studies Any changes since last visit?  no  Physicians involved in your care Any changes since last visit?  no   Family History  Problem Relation Age of Onset  . Hyperlipidemia Father   . Hypertension Father   . Heart disease Father   . Colon polyps Neg Hx    History   Social History  . Marital Status:  Married    Spouse Name: N/A  . Number of Children: 2  . Years of Education: N/A   Occupational History  . disability    Social History Main Topics  . Smoking status: Current Every Day Smoker -- 1.50 packs/day    Types: Cigarettes  . Smokeless tobacco: Never Used  . Alcohol Use: No  . Drug Use: No  . Sexual Activity:    Partners: Male   Other Topics Concern  . None   Social History Narrative   Husband on disability   Pt also on disability   Past Surgical History  Procedure Laterality Date  . Tonsillectomy    . Cholecystectomy    . Appendectomy    . Esophagogastroduodenoscopy  2010    showed erosions  . Colonoscopy w/ biopsies  2010    adenomatous polyp repeat 2015  . Lumbar fusion  2011    L4-L5-S1 Dr. Marcial Pacas  . Abdominal hysterectomy    . Colonoscopy with propofol N/A 03/13/2013    Procedure: COLONOSCOPY WITH PROPOFOL;  Surgeon: Lafayette Dragon, MD;  Location: [facility] END[facility]COPY;  Service: Endoscopy;  Laterality: N/A;   Past Medical History  Diagnosis Date  . Hypertension   . Depression   . Irritable bowel syndrome   . Diabetes mellitus type II   . Panic attack as reaction to stress   . Insomnia     03-08-13"states is in control"  . Tremor   . Memory loss of unknown cause   . Back pain, chronic   .  HX OF GALLSTONE 04/21/2008    Qualifier: Diagnosis of  By: Julaine Hua CMA ([place]), Estill Bamberg    . HEADACHE, CHRONIC 04/21/2008  . Complication of anesthesia     manic episode  . GERD (gastroesophageal reflux disease)   . H/O hiatal hernia   . Arthritis    BP 129/80 mmHg  Pulse 97  Resp 14  SpO2 98%  Opioid Risk Score:   Fall Risk Score: Moderate Fall Risk (6-13 points)`1  Depression screen PHQ 2/9  Depression screen [facility] 2/9 03/13/2014 12/17/2013 09/24/2012 09/24/2012  Decreased Interest 1 0 2 (No Data)  Down, Depressed, Hopeless 2 0 2 (No Data)  PHQ - 2 Score 3 0 4 -  Altered sleeping 1 - 1 -  Tired, decreased energy 2 - 1 -  Change in appetite 0 - 1 -  Feeling bad or  failure about yourself  1 - 2 -  Trouble concentrating 2 - 1 -  Moving slowly or fidgety/restless 1 - 2 -  Suicidal thoughts 0 - 0 -  PHQ-9 Score 10 - 12 -     Review of Systems  HENT: Negative.   Eyes: Negative.   Respiratory: Negative.   Cardiovascular: Negative.   Gastrointestinal: Positive for constipation.  Endocrine: Negative.   Genitourinary:       Bladder control problems  Musculoskeletal: Positive for myalgias, back pain and arthralgias.       Increased pain in hips - right side is worse  Skin: Negative.   Allergic/Immunologic: Negative.   Neurological: Positive for numbness.       Spasms, trouble walking  Hematological: Negative.   Psychiatric/Behavioral: Positive for dysphoric mood. The patient is nervous/anxious.        Objective:   Physical Exam  Constitutional: She is oriented to person, place, and time. She appears well-developed.  HENT:  Head: Normocephalic and atraumatic.  Eyes: Conjunctivae and EOM are normal. Pupils are equal, round, and reactive to light.  Neck: Normal range of motion.  Musculoskeletal:       Right hip: She exhibits bony tenderness. She exhibits no deformity.       Left hip: She exhibits no tenderness.       Lumbar back: She exhibits decreased range of motion and tenderness. She exhibits no deformity and no spasm.  Neurological: She is alert and oriented to person, place, and time.  Psychiatric: She has a normal mood and affect.  Nursing note and vitals reviewed.   Morbidly obese female in no acute distress Mood and affect are flat Her back is tenderness to palpation at L4-S1. No tenderness above L4. There is some tenderness along the sacral area. There is also tenderness along the right greater trochanter of the hip. Upper extremity strength is 5/5 in the deltoid, biceps, triceps, grip Low semi-strength is 4/5 and hip flexor and knee extensor ankle dorsi flexor plantar flexors she does have some pain inhibition Sensation intact  and equal bilateral lower extremities Deep tendon reflexes are  reduced at the ankle and knees bilaterally.      Assessment & Plan:  1. Lumbar postlaminectomy syndrome with chronic postoperative pain. While she responded well to upper lumbar medial branch blocks,She did not get any significant relief with radiofrequency neurotomy at corresponding levels. When looking at her pain location, it appears to be lower than the radiofrequency procedure and in fact is around the area of the previous fusion. The patient has some pain radiating to the hips but not below the knee. No clear  cut signs of radiculopathy however could have an L3 radiculopathy based on pain distribution. We will repeat MRI lumbar spine to further evaluate given the complex history as well as her response to recent treatment. If MRI is unremarkable in terms of adjacent level degeneration or problems at the fusion site, consider sacroiliac dysfunction Will increase oxycodone 5 mg 3 times a day from twice a day and maintain on fentanyl patch 25 g every 72 hours  Return to clinic one month

## 2014-07-05 LAB — PMP ALCOHOL METABOLITE (ETG): Ethyl Glucuronide (EtG): NEGATIVE ng/mL

## 2014-07-09 LAB — BENZODIAZEPINES (GC/LC/MS), URINE
ALPRAZOLAMU: NEGATIVE ng/mL (ref <25)
Clonazepam metabolite (GC/LC/MS), ur confirm: 224 ng/mL — AB (ref <25)
Flurazepam metabolite (GC/LC/MS), ur confirm: NEGATIVE ng/mL (ref <50)
LORAZEPAMU: NEGATIVE ng/mL (ref <50)
Midazolam (GC/LC/MS), ur confirm: NEGATIVE ng/mL (ref <50)
Nordiazepam (GC/LC/MS), ur confirm: NEGATIVE ng/mL (ref <50)
Oxazepam (GC/LC/MS), ur confirm: NEGATIVE ng/mL (ref <50)
TEMAZEPAMU: NEGATIVE ng/mL (ref <50)
Triazolam metabolite (GC/LC/MS), ur confirm: NEGATIVE ng/mL (ref <50)

## 2014-07-09 LAB — FENTANYL (GC/LC/MS), URINE
FENTANYL (GC/MS) CONFIRM: NEGATIVE ng/mL — AB (ref <0.5)
Norfentanyl, confirm: 27.2 ng/mL (ref <0.5)

## 2014-07-10 LAB — PRESCRIPTION MONITORING PROFILE (SOLSTAS)
Amphetamine/Meth: NEGATIVE ng/mL
BARBITURATE SCREEN, URINE: NEGATIVE ng/mL
BUPRENORPHINE, URINE: NEGATIVE ng/mL
CANNABINOID SCRN UR: NEGATIVE ng/mL
COCAINE METABOLITES: NEGATIVE ng/mL
Carisoprodol, Urine: NEGATIVE ng/mL
Creatinine, Urine: 42.44 mg/dL (ref >20.0)
MDMA URINE: NEGATIVE ng/mL
Meperidine, Ur: NEGATIVE ng/mL
Methadone Screen, Urine: NEGATIVE ng/mL
Nitrites, Initial: NEGATIVE ug/mL
OPIATE SCREEN, URINE: NEGATIVE ng/mL
Oxycodone Screen, Ur: NEGATIVE ng/mL
Propoxyphene: NEGATIVE ng/mL
Tapentadol, urine: NEGATIVE ng/mL
Tramadol Scrn, Ur: NEGATIVE ng/mL
Zolpidem, Urine: NEGATIVE ng/mL
pH, Initial: 7.3 pH (ref 4.5–8.9)

## 2014-07-17 NOTE — Progress Notes (Signed)
Urine drug screen for this encounter is consistent for prescribed medication fentanyl but no oxycodone.  Reported taking 07/02/14.

## 2014-07-17 NOTE — Progress Notes (Signed)
error 

## 2014-07-22 ENCOUNTER — Encounter: Payer: Self-pay | Admitting: Physical Medicine & Rehabilitation

## 2014-07-22 ENCOUNTER — Ambulatory Visit (HOSPITAL_BASED_OUTPATIENT_CLINIC_OR_DEPARTMENT_OTHER): Payer: No Typology Code available for payment source | Admitting: Physical Medicine & Rehabilitation

## 2014-07-22 ENCOUNTER — Encounter: Payer: No Typology Code available for payment source | Attending: Physical Medicine & Rehabilitation

## 2014-07-22 ENCOUNTER — Other Ambulatory Visit: Payer: Self-pay | Admitting: Family Medicine

## 2014-07-22 VITALS — BP 114/80 | HR 90 | Resp 14

## 2014-07-22 DIAGNOSIS — M961 Postlaminectomy syndrome, not elsewhere classified: Secondary | ICD-10-CM

## 2014-07-22 DIAGNOSIS — M47816 Spondylosis without myelopathy or radiculopathy, lumbar region: Secondary | ICD-10-CM | POA: Insufficient documentation

## 2014-07-22 DIAGNOSIS — M7062 Trochanteric bursitis, left hip: Secondary | ICD-10-CM

## 2014-07-22 MED ORDER — FENTANYL 25 MCG/HR TD PT72: 25.0000 ug | MEDICATED_PATCH | TRANSDERMAL | Status: DC

## 2014-07-22 MED ORDER — OXYCODONE HCL 5 MG PO TABS: 5.0000 mg | ORAL_TABLET | Freq: Three times a day (TID) | ORAL | Status: DC

## 2014-07-22 NOTE — Patient Instructions (Signed)
We are canceling the MRI, you don't need to go there on 07/28/2014  You may get hip injections every 3 months please tell Zella Ball if you feel like he need another one so she can schedule it for the following month

## 2014-07-22 NOTE — Progress Notes (Signed)
Subjective:    Patient ID: Christine Bradshaw, female    DOB: [birth year], 49 y.o.   MRN: [id number] 49 year old female with chronic low back pain. She is status post L4-5 and L5-S1 laminectomy and fusion in 2011. Postoperative myelogram in 2013 demonstrated intact fusion and no significant adjacent level disease. Patient had greater than 50% relief on 2 occasions following T12-L1 and L2 medial branch blocks however after radiofrequency procedure her pain returned.  HPI Right Hip pain is better now Chronic Left foot numbness MRI was ordered to evaluate etiology of the right greater than left hip pain however the right hip pain improved after the trochanteric bursa injection. Patient has her chronic low back pain as well as her chronic left foot numbness. Pain Inventory Average Pain 7 Pain Right Now 7 My pain is constant, sharp, burning and stabbing  In the last 24 hours, has pain interfered with the following? General activity 7 Relation with others 6 Enjoyment of life 7 What TIME of day is your pain at its worst? morning, evening and night Sleep (in general) Fair  Pain is worse with: walking, standing and some activites Pain improves with: heat/ice and medication Relief from Meds: 5  Mobility walk without assistance use a walker how many minutes can you walk? 4 ability to climb steps?  yes do you drive?  no  Function disabled: date disabled .  Neuro/Psych weakness trouble walking spasms depression anxiety  Prior Studies Any changes since last visit?  no  Physicians involved in your care Any changes since last visit?  no   Family History  Problem Relation Age of Onset  . Hyperlipidemia Father   . Hypertension Father   . Heart disease Father   . Colon polyps Neg Hx    History   Social History  . Marital Status: Married    Spouse Name: N/A  . Number of Children: 2  . Years of Education: N/A   Occupational History  . disability    Social History Main  Topics  . Smoking status: Current Every Day Smoker -- 1.50 packs/day    Types: Cigarettes  . Smokeless tobacco: Never Used  . Alcohol Use: No  . Drug Use: No  . Sexual Activity:    Partners: Male   Other Topics Concern  . None   Social History Narrative   Husband on disability   Pt also on disability   Past Surgical History  Procedure Laterality Date  . Tonsillectomy    . Cholecystectomy    . Appendectomy    . Esophagogastroduodenoscopy  2010    showed erosions  . Colonoscopy w/ biopsies  2010    adenomatous polyp repeat 2015  . Lumbar fusion  2011    L4-L5-S1 Dr. Marcial Pacas  . Abdominal hysterectomy    . Colonoscopy with propofol N/A 03/13/2013    Procedure: COLONOSCOPY WITH PROPOFOL;  Surgeon: Lafayette Dragon, MD;  Location: [facility];  Service: Endoscopy;  Laterality: N/A;   Past Medical History  Diagnosis Date  . Hypertension   . Depression   . Irritable bowel syndrome   . Diabetes mellitus type II   . Panic attack as reaction to stress   . Insomnia     03-08-13"states is in control"  . Tremor   . Memory loss of unknown cause   . Back pain, chronic   . HX OF GALLSTONE 04/21/2008    Qualifier: Diagnosis of  By: Julaine Hua CMA ([place]), Estill Bamberg    . HEADACHE, CHRONIC  04/21/2008  . Complication of anesthesia     manic episode  . GERD (gastroesophageal reflux disease)   . H/O hiatal hernia   . Arthritis    BP 114/80 mmHg  Pulse 90  Resp 14  SpO2 97%  Opioid Risk Score:   Fall Risk Score:  `1  Depression screen PHQ 2/9  Depression screen [facility] 2/9 03/13/2014 12/17/2013 09/24/2012 09/24/2012  Decreased Interest 1 0 2 (No Data)  Down, Depressed, Hopeless 2 0 2 (No Data)  PHQ - 2 Score 3 0 4 -  Altered sleeping 1 - 1 -  Tired, decreased energy 2 - 1 -  Change in appetite 0 - 1 -  Feeling bad or failure about yourself  1 - 2 -  Trouble concentrating 2 - 1 -  Moving slowly or fidgety/restless 1 - 2 -  Suicidal thoughts 0 - 0 -  PHQ-9 Score 10 - 12 -     Review of  Systems  HENT: Negative.   Eyes: Negative.   Respiratory: Negative.   Cardiovascular: Negative.   Gastrointestinal: Negative.   Endocrine: Negative.   Genitourinary: Negative.   Musculoskeletal: Positive for myalgias, back pain and arthralgias.  Skin: Negative.   Allergic/Immunologic: Negative.   Neurological: Positive for weakness.       Trouble walking, spasms  Hematological: Negative.   Psychiatric/Behavioral: Positive for dysphoric mood. The patient is nervous/anxious.        Objective:   Physical Exam  Constitutional: She is oriented to person, place, and time. She appears well-developed and well-nourished.  HENT:  Head: Normocephalic and atraumatic.  Eyes: Conjunctivae and EOM are normal. Pupils are equal, round, and reactive to light.  Neck: Normal range of motion.  Neurological: She is alert and oriented to person, place, and time.  Psychiatric: She has a normal mood and affect.  Nursing note and vitals reviewed.   Tenderness palpation over the left greater trochanter, minimal tenderness over the right greater trochanter of the hip Limited lumbar range of motion with flexion extension lateral bending and rotation Sensation intact to light touch and pinprick both lower extremities Negative straight leg raising Motor strength is 5/5 bilateral hip flexor and extensor ankle dorsal flexor plantar flexor      Assessment & Plan:  1. Bilateral trochanteric bursitis the right side improved after injection the left side is still painful Will inject left hip today  Trochanteric bursa injection without ultrasound guidance  Indication Trochanteric bursitis. Exam has tenderness over the greater trochanter of the hip. Pain has not responded to conservative care such as exercise therapy and oral medications. Pain interferes with sleep or with mobility Informed consent was obtained after describing risks and benefits of the procedure with the patient these include bleeding  bruising and infection. Patient has signed written consent form. Patient placed in a lateral decubitus position with the affected hip superior. Point of maximal pain was palpated marked and prepped with Betadine and entered with a needle to bone contact. 2 inch 21-gauge Needle slightly withdrawn then 6mg  of betamethasone with 4 cc 1% lidocaine were injected. Patient tolerated procedure well. Post procedure instructions given.  2. Lumbar postlaminectomy syndrome most recent imaging study showed intact fusion without evidence of Adjacent level disease. At this point I think the hip pain was related to trochanteric bursitis and not a new problem with her low back. Therefore I will cancel her MRI lumbar spine Continue fentanyl 25 g every 72 hours Continue oxycodone 5 mg 3 times a day  when necessary Continue opioid monitoring program. This consists of regular clinic visits, examinations, urine drug screen, pill counts as well as use of [place] controlled substance reporting System.

## 2014-07-28 ENCOUNTER — Other Ambulatory Visit: Payer: No Typology Code available for payment source

## 2014-07-29 ENCOUNTER — Other Ambulatory Visit: Payer: Self-pay | Admitting: Family Medicine

## 2014-08-01 ENCOUNTER — Ambulatory Visit: Payer: No Typology Code available for payment source | Admitting: Physical Medicine & Rehabilitation

## 2014-08-01 ENCOUNTER — Ambulatory Visit: Payer: No Typology Code available for payment source

## 2014-08-12 ENCOUNTER — Other Ambulatory Visit: Payer: Self-pay | Admitting: Family Medicine

## 2014-08-13 ENCOUNTER — Other Ambulatory Visit: Payer: Self-pay | Admitting: *Deleted

## 2014-08-13 MED ORDER — METFORMIN HCL 1000 MG PO TABS: 1000.0000 mg | ORAL_TABLET | Freq: Two times a day (BID) | ORAL | Status: DC

## 2014-08-13 NOTE — Telephone Encounter (Signed)
Will send in RF.  Recommend that patient f/u with PCP for DM2

## 2014-08-21 ENCOUNTER — Encounter
Payer: No Typology Code available for payment source | Attending: Physical Medicine & Rehabilitation | Admitting: Registered Nurse

## 2014-08-21 ENCOUNTER — Encounter: Payer: Self-pay | Admitting: Registered Nurse

## 2014-08-21 VITALS — BP 111/76 | HR 90 | Resp 14

## 2014-08-21 DIAGNOSIS — Z5181 Encounter for therapeutic drug level monitoring: Secondary | ICD-10-CM

## 2014-08-21 DIAGNOSIS — T402X5A Adverse effect of other opioids, initial encounter: Secondary | ICD-10-CM

## 2014-08-21 DIAGNOSIS — M961 Postlaminectomy syndrome, not elsewhere classified: Secondary | ICD-10-CM | POA: Diagnosis not present

## 2014-08-21 DIAGNOSIS — K5909 Other constipation: Secondary | ICD-10-CM

## 2014-08-21 DIAGNOSIS — M25551 Pain in right hip: Secondary | ICD-10-CM | POA: Diagnosis not present

## 2014-08-21 DIAGNOSIS — M47816 Spondylosis without myelopathy or radiculopathy, lumbar region: Secondary | ICD-10-CM | POA: Insufficient documentation

## 2014-08-21 DIAGNOSIS — G894 Chronic pain syndrome: Secondary | ICD-10-CM

## 2014-08-21 DIAGNOSIS — Z79899 Other long term (current) drug therapy: Secondary | ICD-10-CM

## 2014-08-21 DIAGNOSIS — K5903 Drug induced constipation: Secondary | ICD-10-CM

## 2014-08-21 MED ORDER — OXYCODONE HCL 5 MG PO TABS: 5.0000 mg | ORAL_TABLET | Freq: Three times a day (TID) | ORAL | Status: DC

## 2014-08-21 MED ORDER — FENTANYL 25 MCG/HR TD PT72: 25.0000 ug | MEDICATED_PATCH | TRANSDERMAL | Status: DC

## 2014-08-21 NOTE — Progress Notes (Signed)
Subjective:    Patient ID: Christine Bradshaw, female    DOB: 22-Sep-1965, 49 y.o.   MRN: [id number]  HPI: Christine Bradshaw is a 49 year old female who returns for follow up for chronic pain and medication refill. She says her pain is located in her lower back and bilateral hips. She rates her pain 6. Also complaining of constipation only moving her bowels weekly she has tried colace, bisacodyl,miralax, MOM and fleets enema unsuccessful. She was given Movantik samples two boxes. Also educated on fruits, vegetables, prune juiced and infused water she verbalizes understanding.  Instructed to call office on Monday 08/25/14 to evaluate she verbalizes understanding.  Her current exercise regime is  walking with her walker for short distances.  Also return last month script of oxycodone script discarded new script given.  Pain Inventory Average Pain 6 Pain Right Now 6 My pain is sharp and burning  In the last 24 hours, has pain interfered with the following? General activity 7 Relation with others 6 Enjoyment of life 7 What TIME of day is your pain at its worst? morning, daytime, night Sleep (in general) Fair  Pain is worse with: walking, standing and some activites Pain improves with: heat/ice, medication and injections Relief from Meds: 5  Mobility use a walker how many minutes can you walk? 4 ability to climb steps?  yes do you drive?  no use a wheelchair Do you have any goals in this area?  yes  Function I need assistance with the following:  meal prep, household duties and shopping Do you have any goals in this area?  yes  Neuro/Psych spasms depression anxiety  Prior Studies Any changes since last visit?  no  Physicians involved in your care Any changes since last visit?  no   Family History  Problem Relation Age of Onset  . Hyperlipidemia Father   . Hypertension Father   . Heart disease Father   . Colon polyps Neg Hx    Social History   Social History  .  Marital Status: Married    Spouse Name: N/A  . Number of Children: 2  . Years of Education: N/A   Occupational History  . disability    Social History Main Topics  . Smoking status: Current Every Day Smoker -- 1.50 packs/day    Types: Cigarettes  . Smokeless tobacco: Never Used  . Alcohol Use: No  . Drug Use: No  . Sexual Activity:    Partners: Male   Other Topics Concern  . None   Social History Narrative   Husband on disability   Pt also on disability   Past Surgical History  Procedure Laterality Date  . Tonsillectomy    . Cholecystectomy    . Appendectomy    . Esophagogastroduodenoscopy  2010    showed erosions  . Colonoscopy w/ biopsies  2010    adenomatous polyp repeat 2015  . Lumbar fusion  2011    L4-L5-S1 Dr. Marcial Pacas  . Abdominal hysterectomy    . Colonoscopy with propofol N/A 03/13/2013    Procedure: COLONOSCOPY WITH PROPOFOL;  Surgeon: Lafayette Dragon, MD;  Location: [facility];  Service: Endoscopy;  Laterality: N/A;   Past Medical History  Diagnosis Date  . Hypertension   . Depression   . Irritable bowel syndrome   . Diabetes mellitus type II   . Panic attack as reaction to stress   . Insomnia     03-08-13"states is in control"  . Tremor   .  Memory loss of unknown cause   . Back pain, chronic   . HX OF GALLSTONE 04/21/2008    Qualifier: Diagnosis of  By: Julaine Hua CMA ([place]), Estill Bamberg    . HEADACHE, CHRONIC 04/21/2008  . Complication of anesthesia     manic episode  . GERD (gastroesophageal reflux disease)   . H/O hiatal hernia   . Arthritis    BP 111/76 mmHg  Pulse 90  Resp 14  SpO2 94%  Opioid Risk Score:   Fall Risk Score:  `1  Depression screen PHQ 2/9  Depression screen [facility] 2/9 03/13/2014 12/17/2013 09/24/2012 09/24/2012  Decreased Interest 1 0 2 (No Data)  Down, Depressed, Hopeless 2 0 2 (No Data)  PHQ - 2 Score 3 0 4 -  Altered sleeping 1 - 1 -  Tired, decreased energy 2 - 1 -  Change in appetite 0 - 1 -  Feeling bad or failure about  yourself  1 - 2 -  Trouble concentrating 2 - 1 -  Moving slowly or fidgety/restless 1 - 2 -  Suicidal thoughts 0 - 0 -  PHQ-9 Score 10 - 12 -     Review of Systems  Neurological:       Spasms   Psychiatric/Behavioral: Positive for dysphoric mood. The patient is nervous/anxious.   All other systems reviewed and are negative.      Objective:   Physical Exam  Constitutional: She is oriented to person, place, and time. She appears well-developed and well-nourished.  HENT:  Head: Normocephalic and atraumatic.  Neck: Normal range of motion. Neck supple.  Cardiovascular: Normal rate and regular rhythm.   Pulmonary/Chest: Effort normal and breath sounds normal.  Musculoskeletal:  Normal Muscle Bulk and Muscle Testing Reveals: Upper Extremities: Full ROM and Muscle strength 5/5 Lumbar Paraspinal Tenderness: L-3- L-5 Right Greater Trochanteric tenderness Lower Extremities: Full ROM and Muscle strength 5/5 Arises from chair with ease/ using walker for support Narrow Based Gait  Neurological: She is alert and oriented to person, place, and time.  Skin: Skin is warm and dry.  Psychiatric: She has a normal mood and affect.  Nursing note and vitals reviewed.         Assessment & Plan:  1. Lumbar postlaminectomy syndrome- status post L4-5 and L5-S1 fusion with chronic postoperative pain as well as a chronic left L5 radiculopathy. Refilled: Fentanyl Patch 25 mcg one patch every three days #10 and Oxycodone 5 mg TID #90.  Continue with heat and exercise therapy. 2. Lumbar spondylosis above the level of the fusion: Continue to Monitor 3. Left Greater Trochanteric Bursitis: No complaints S/ P Cortisone injection with good relief noted 4. Right Greater Trochanteric tenderness: Continue with Heat and exercise. 5. Constipation due to opioid therapy: Movantik Samples Given  20 minutes of face to face patient care time was spent during this visit. All questions was encouraged and  answered.   F/U in 1 month.

## 2014-08-26 ENCOUNTER — Telehealth: Payer: Self-pay | Admitting: *Deleted

## 2014-08-26 MED ORDER — NALOXEGOL OXALATE 25 MG PO TABS: 25.0000 mg | ORAL_TABLET | Freq: Every day | ORAL | Status: DC

## 2014-08-26 NOTE — Telephone Encounter (Signed)
Spoke to Christine Bradshaw, she ia aware Movantik has been ordered.  Also states she's having good bowel movements daily. Office will start the PA process she is aware and verbalizes understanding.

## 2014-08-26 NOTE — Telephone Encounter (Signed)
Saw Christine Bradshaw Thursday, was given samples of Movantix, pt reporting good results with the sample medication. She would like to start the process in order to receive  This medication

## 2014-09-01 ENCOUNTER — Telehealth: Payer: Self-pay | Admitting: *Deleted

## 2014-09-01 NOTE — Telephone Encounter (Signed)
New prior auth sent to [facility] today for [facility] with list of tried and failed medications

## 2014-09-05 ENCOUNTER — Telehealth: Payer: Self-pay | Admitting: *Deleted

## 2014-09-05 NOTE — Telephone Encounter (Signed)
Christine Bradshaw is calling to get samples of Movantik since her insurance has turned her down.  I old her she amy come to pick up samples and we will need to write a letter of appeal for [name].  I asked her to bring the letter she received form her insurance company when she comes to pick up samples.

## 2014-09-10 ENCOUNTER — Telehealth: Payer: Self-pay | Admitting: Family Medicine

## 2014-09-10 NOTE — Telephone Encounter (Signed)
Pt called to say that the sample med given for constipation, Movantik, is not something the ins company covers.  Will however approve the Linzess.  Patient would like to have a rx sent via phone  to company and will start taking before her appt on 09/15/14 and will discuss this with provider at that time.  Can call them at [phone]

## 2014-09-10 NOTE — Telephone Encounter (Signed)
Movantik is a medication used to treat opioid-induced constipation. We did not give this to her. This was likely given to her by the pain clinic. Patient needs to contact the clinic who gave her the sample for a new prescription.  Algis Greenhouse. Jerline Pain, [place] Resident PGY-2 09/10/2014 12:31 PM

## 2014-09-10 NOTE — Telephone Encounter (Signed)
Spoke with patient and informed her of message.  She would prefer to speak with her pcp regarding constipation treatment since the sample medication wasn't helping.  Confirmed appt with Dr. Jerline Pain and that we would discuss then. Patient voiced understanding. Jazmin Hartsell,CMA

## 2014-09-15 ENCOUNTER — Ambulatory Visit (INDEPENDENT_AMBULATORY_CARE_PROVIDER_SITE_OTHER): Payer: No Typology Code available for payment source | Admitting: Family Medicine

## 2014-09-15 ENCOUNTER — Encounter: Payer: Self-pay | Admitting: Family Medicine

## 2014-09-15 VITALS — BP 107/80 | HR 87 | Temp 98.3°F | Ht 64.0 in | Wt 243.2 lb

## 2014-09-15 DIAGNOSIS — E119 Type 2 diabetes mellitus without complications: Secondary | ICD-10-CM | POA: Diagnosis not present

## 2014-09-15 DIAGNOSIS — Z794 Long term (current) use of insulin: Secondary | ICD-10-CM | POA: Diagnosis not present

## 2014-09-15 DIAGNOSIS — R350 Frequency of micturition: Secondary | ICD-10-CM | POA: Diagnosis not present

## 2014-09-15 DIAGNOSIS — E785 Hyperlipidemia, unspecified: Secondary | ICD-10-CM | POA: Diagnosis not present

## 2014-09-15 DIAGNOSIS — Z23 Encounter for immunization: Secondary | ICD-10-CM

## 2014-09-15 DIAGNOSIS — R300 Dysuria: Secondary | ICD-10-CM | POA: Insufficient documentation

## 2014-09-15 DIAGNOSIS — K5900 Constipation, unspecified: Secondary | ICD-10-CM

## 2014-09-15 DIAGNOSIS — Z720 Tobacco use: Secondary | ICD-10-CM

## 2014-09-15 LAB — POCT URINALYSIS DIPSTICK
BILIRUBIN UA: NEGATIVE
Glucose, UA: NEGATIVE
KETONES UA: NEGATIVE
Leukocytes, UA: NEGATIVE
Nitrite, UA: NEGATIVE
PH UA: 6.5
PROTEIN UA: NEGATIVE
RBC UA: NEGATIVE
Spec Grav, UA: 1.01
Urobilinogen, UA: 0.2

## 2014-09-15 LAB — POCT GLYCOSYLATED HEMOGLOBIN (HGB A1C): HEMOGLOBIN A1C: 5.4

## 2014-09-15 MED ORDER — LINACLOTIDE 145 MCG PO CAPS: 145.0000 ug | ORAL_CAPSULE | Freq: Every day | ORAL | Status: DC

## 2014-09-15 NOTE — Assessment & Plan Note (Addendum)
Likely secondary to chronic narcotics. No signs of obstruction. Will try Linzess. May consider combination approach at next office visit with high dose miralax and stool softeners. If no success, patient may need referral to GI - she has been on several stool softeners, laxatives, suppositories, and enemas in the past without significant relief. Reports having colonoscopy in the past, though only found diverticulosis.

## 2014-09-15 NOTE — Assessment & Plan Note (Signed)
UA unremarkable today. No signs of systemic infections. Will check urine culture.

## 2014-09-15 NOTE — Assessment & Plan Note (Signed)
Discussed cessation today. Patient not ready to quit at this time. Unable to identify specific barrier. Gave 1-800-QUIT-NOW hotline number.

## 2014-09-15 NOTE — Assessment & Plan Note (Signed)
Well controlled on Lantus 40U nightly. Discussed decreasing dose, however patient deferred at this time. Will continue current dose and continue to monitor for hypoglycemic episodes. Follow up in 3 months.

## 2014-09-15 NOTE — Patient Instructions (Signed)
Thank you for coming to the clinic today. It was nice seeing you.  Your blood sugars look good today. We will continue your lantus at your current dose.  We will check a urine culture. This will take a couple days to come back.  For your constipation, we will start a new medication called Linzess today.   We will check your cholesterol levels today.  Please call the 1-800-QUIT-NOW hotline when you are ready to start thinking about stopping smoking.  See you again in 3 months.  Take care,  Dr Jerline Pain

## 2014-09-15 NOTE — Progress Notes (Signed)
Subjective:  Christine Bradshaw is a 49 y.o. female who presents to the [facility] today with a chief complaint of DM follow up.   HPI: DIABETES Type II Medications: Reports taking and tolerating without side effects. Currently on 40U of lantus daily. Blood Sugars per patient: Fasting: 90s, High: 170, Low:68   Hemoglobin a1c:  Lab Results  Component Value Date   HGBA1C 5.4 09/15/2014   HGBA1C 5.4 03/05/2014   HGBA1C 5.4 12/17/2013    On Aspirin-yes On statin-yes Daily foot monitoring-yes Last eye exam: 05/07/2012 Last foot exam: 03/05/2014 Nephropathy screen:  ROS- Denies Polyuria,Polydipsia, nocturia, Vision changes, feet or hand numbness/pain/tingling. Denies  Hypoglycemia symptoms (shaky, sweaty, hungry, weak anxious, tremor, palpitations, confusion, behavior change).   Urinary Frequency Patient reports symptoms started about a month ago. Also with increased frequency and dysuria. Additionally reports foul odor and increased urgency. No fevers or chills. No worsened back pain.   Constipation Chronic problem for patient. Has been going on for several months to years.. Has been going only once every 7-10 days. Has tried miralax, suppositories, colace, enemas. Nothing has seemed to help. Last BM 5 or 6 days ago. Prior BM 10 days prior to that. Tried several combinations of the above with no success. No abdominal pain. No weight changes.   Tobacco Abuse Smoking for 33 years. Has quit for several months at a time in the past. Not interested in quitting at this time. No cough or shortness of breath.   HLD Compliant with pravastatin. No side effects.   ROS: Per HPI, otherwise all systems reviewed and are negative  PMH:  The following were reviewed and entered/updated in epic: Past Medical History  Diagnosis Date  . Hypertension   . Depression   . Irritable bowel syndrome   . Diabetes mellitus type II   . Panic attack as reaction to stress   . Insomnia     03-08-13"states is  in control"  . Tremor   . Memory loss of unknown cause   . Back pain, chronic   . HX OF GALLSTONE 04/21/2008    Qualifier: Diagnosis of  By: Julaine Hua CMA ([place]), Estill Bamberg    . HEADACHE, CHRONIC 04/21/2008  . Complication of anesthesia     manic episode  . GERD (gastroesophageal reflux disease)   . H/O hiatal hernia   . Arthritis    Patient Active Problem List   Diagnosis Date Noted  . Dysuria 09/15/2014  . Constipation 09/15/2014  . Lumbar post-laminectomy syndrome 07/03/2014  . Altered awareness, transient 04/02/2014  . Seizure-like activity 03/05/2014  . Ear itching 12/17/2013  . Spondylosis of lumbar region without myelopathy or radiculopathy 10/15/2013  . Healthcare maintenance 10/01/2013  . Special screening for malignant neoplasms, colon 03/13/2013  . Tobacco abuse 04/25/2011  . Skin lesion of left leg 04/21/2011  . Venous insufficiency 03/11/2010  . Diabetic neuropathy, painful 03/10/2010  . BIPOLAR DISORDER UNSPECIFIED 06/05/2009  . BACK PAIN, CHRONIC 06/05/2009  . Obesity, morbid, BMI 50 or higher 04/21/2008  . ANXIETY 04/21/2008  . Essential hypertension 04/21/2008  . Diabetes mellitus, type II, insulin dependent 04/17/2008  . Hyperlipidemia 04/17/2008  . Irritable bowel syndrome 04/17/2008  . COLONIC POLYPS, HYPERPLASTIC, HX OF 04/17/2008   Past Surgical History  Procedure Laterality Date  . Tonsillectomy    . Cholecystectomy    . Appendectomy    . Esophagogastroduodenoscopy  2010    showed erosions  . Colonoscopy w/ biopsies  2010    adenomatous polyp repeat  2015  . Lumbar fusion  2011    L4-L5-S1 Dr. Marcial Pacas  . Abdominal hysterectomy    . Colonoscopy with propofol N/A 03/13/2013    Procedure: COLONOSCOPY WITH PROPOFOL;  Surgeon: Lafayette Dragon, MD;  Location: [facility];  Service: Endoscopy;  Laterality: N/A;     Objective:  Physical Exam: BP 107/80 mmHg  Pulse 87  Temp(Src) 98.3 F (36.8 C) (Oral)  Ht 5\' 4"  (1.626 m)  Wt 243 lb 3 oz (110.309 kg)   BMI 41.72 kg/m2  Gen: NAD, resting comfortably CV: RRR with no murmurs appreciated Lungs: NWOB, CTAB with no crackles, wheezes, or rhonchi GI: Normal bowel sounds present. Soft, Nontender, Nondistended. MSK: no edema, cyanosis, or clubbing noted Skin: warm, dry Neuro: grossly normal, moves all extremities Psych: Normal affect and thought content  Results for orders placed or performed in visit on 09/15/14 (from the past 72 hour(s))  POCT glycosylated hemoglobin (Hb A1C)     Status: None   Collection Time: 09/15/14  1:48 PM  Result Value Ref Range   Hemoglobin A1C 5.4   POCT urinalysis dipstick     Status: None   Collection Time: 09/15/14  2:06 PM  Result Value Ref Range   Color, UA YELLOW    Clarity, UA CLEAR    Glucose, UA NEG    Bilirubin, UA NEG    Ketones, UA NEG    Spec Grav, UA 1.010    Blood, UA NEG    pH, UA 6.5    Protein, UA NEG    Urobilinogen, UA 0.2    Nitrite, UA NEG    Leukocytes, UA Negative Negative     Assessment/Plan:  Diabetes mellitus, type II, insulin dependent Well controlled on Lantus 40U nightly. Discussed decreasing dose, however patient deferred at this time. Will continue current dose and continue to monitor for hypoglycemic episodes. Follow up in 3 months.   Dysuria UA unremarkable today. No signs of systemic infections. Will check urine culture.   Constipation Likely secondary to chronic narcotics. No signs of obstruction. Will try Linzess. May consider combination approach at next office visit with high dose miralax and stool softeners. If no success, patient may need referral to GI - she has been on several stool softeners, laxatives, suppositories, and enemas in the past without significant relief. Reports having colonoscopy in the past, though only found diverticulosis.   Tobacco abuse Discussed cessation today. Patient not ready to quit at this time. Unable to identify specific barrier. Gave 1-800-QUIT-NOW hotline number.    Algis Greenhouse.  Jerline Pain, [place] [facility] Resident PGY-2 09/15/2014 4:56 PM

## 2014-09-16 ENCOUNTER — Telehealth: Payer: Self-pay | Admitting: *Deleted

## 2014-09-16 ENCOUNTER — Encounter: Payer: No Typology Code available for payment source | Attending: Physical Medicine & Rehabilitation

## 2014-09-16 ENCOUNTER — Encounter: Payer: Self-pay | Admitting: Physical Medicine & Rehabilitation

## 2014-09-16 ENCOUNTER — Ambulatory Visit (HOSPITAL_BASED_OUTPATIENT_CLINIC_OR_DEPARTMENT_OTHER): Payer: No Typology Code available for payment source | Admitting: Physical Medicine & Rehabilitation

## 2014-09-16 ENCOUNTER — Ambulatory Visit: Payer: No Typology Code available for payment source | Admitting: Physical Medicine & Rehabilitation

## 2014-09-16 VITALS — BP 119/62 | HR 95

## 2014-09-16 DIAGNOSIS — M7061 Trochanteric bursitis, right hip: Secondary | ICD-10-CM

## 2014-09-16 DIAGNOSIS — M47816 Spondylosis without myelopathy or radiculopathy, lumbar region: Secondary | ICD-10-CM | POA: Diagnosis not present

## 2014-09-16 LAB — URINE CULTURE

## 2014-09-16 LAB — LDL CHOLESTEROL, DIRECT: Direct LDL: 57 mg/dL (ref <130)

## 2014-09-16 NOTE — Telephone Encounter (Signed)
Prior Authorization received from [employer] for Linzess 145 mcg.  PA form placed in provider box for completion. Derl Barrow, RN

## 2014-09-16 NOTE — Progress Notes (Signed)
Right Trochanteric bursa injection without ultrasound guidance  Indication Trochanteric bursitis. Exam has tenderness over the greater trochanter of the hip. Pain has not responded to conservative care such as exercise therapy and oral medications. Pain interferes with sleep or with mobility Informed consent was obtained after describing risks and benefits of the procedure with the patient these include bleeding bruising and infection. Patient has signed written consent form. Patient placed in a lateral decubitus position with the affected hip superior. Point of maximal pain was palpated marked and prepped with Betadine and entered with a needle to bone contact. 2 inch 21-gauge Needle slightly withdrawn then 6mg  of betamethasone with 4 cc 1% lidocaine were injected. Patient tolerated procedure well. Post procedure instructions given.

## 2014-09-17 ENCOUNTER — Encounter: Payer: Self-pay | Admitting: Family Medicine

## 2014-09-17 NOTE — Telephone Encounter (Signed)
PA was approved via [place] until 09/16/17. Derl Barrow, RN

## 2014-09-17 NOTE — Telephone Encounter (Signed)
Form completed and returned to [facility].  Algis Greenhouse. Jerline Pain, [place] Resident PGY-2 09/17/2014 9:22 AM

## 2014-09-17 NOTE — Telephone Encounter (Signed)
PA form faxed to [employer] for review.  The review process could take 24-72 hours to complete.  Derl Barrow, RN

## 2014-09-22 ENCOUNTER — Encounter: Payer: No Typology Code available for payment source | Admitting: Registered Nurse

## 2014-10-13 ENCOUNTER — Encounter: Payer: No Typology Code available for payment source | Attending: Physical Medicine & Rehabilitation

## 2014-10-13 ENCOUNTER — Ambulatory Visit (HOSPITAL_BASED_OUTPATIENT_CLINIC_OR_DEPARTMENT_OTHER): Payer: No Typology Code available for payment source | Admitting: Physical Medicine & Rehabilitation

## 2014-10-13 ENCOUNTER — Encounter: Payer: Self-pay | Admitting: Physical Medicine & Rehabilitation

## 2014-10-13 VITALS — BP 116/75 | HR 117

## 2014-10-13 DIAGNOSIS — M47816 Spondylosis without myelopathy or radiculopathy, lumbar region: Secondary | ICD-10-CM | POA: Diagnosis present

## 2014-10-13 DIAGNOSIS — M7062 Trochanteric bursitis, left hip: Secondary | ICD-10-CM | POA: Diagnosis not present

## 2014-10-13 MED ORDER — FENTANYL 25 MCG/HR TD PT72: 25.0000 ug | MEDICATED_PATCH | TRANSDERMAL | Status: DC

## 2014-10-13 MED ORDER — OXYCODONE HCL 5 MG PO TABS: 5.0000 mg | ORAL_TABLET | Freq: Three times a day (TID) | ORAL | Status: DC

## 2014-10-13 NOTE — Progress Notes (Signed)
Left Trochanteric bursa injection With t ultrasound guidance  Indication Trochanteric bursitis. Exam has tenderness over the greater trochanter of the hip. Pain has not responded to conservative care such as exercise therapy and oral medications. Pain interferes with sleep or with mobility  Right hip palpation guided injection last month was not helpful so that we decided to do the left hip under ultrasound Informed consent was obtained after describing risks and benefits of the procedure with the patient these include bleeding bruising and infection. Patient has signed written consent form. Patient placed in a lateral decubitus position with the affected hip superior.Area was marked and prepped. A linear ultrasound transducer was utilized, in plain approach. Greater trochanter visualized and targeted. Area infiltrated with 3 cc of lidocaine into the skin and subcutaneous tissue using a 25-gauge 1.5 inch needle followed by insertion of a 22-gauge 80 mm Echo block needle inserted under direct ultrasound visualization. Bone contact was made confirmed on ultrasound.Needle slightly withdrawn then 6mg  of betamethasone with 4 cc 1% lidocaine were injected. Patient tolerated procedure well. Post procedure instructions given.

## 2014-10-13 NOTE — Patient Instructions (Signed)
Ultrasound-guided hip injection next visit right side

## 2014-10-21 ENCOUNTER — Other Ambulatory Visit: Payer: Self-pay | Admitting: *Deleted

## 2014-10-22 ENCOUNTER — Telehealth: Payer: Self-pay | Admitting: Physical Medicine & Rehabilitation

## 2014-10-22 MED ORDER — OMEPRAZOLE 40 MG PO CPDR: 40.0000 mg | DELAYED_RELEASE_CAPSULE | Freq: Every day | ORAL | Status: DC

## 2014-10-22 NOTE — Telephone Encounter (Signed)
Patient called requesting wheelchair and walker.  Patient will be going through Christine Bradshaw.  Please call patient.

## 2014-10-31 ENCOUNTER — Other Ambulatory Visit: Payer: Self-pay | Admitting: Family Medicine

## 2014-11-03 NOTE — Telephone Encounter (Signed)
Rx filled.  Algis Greenhouse. Jerline Pain, [place] Resident PGY-2 11/03/2014 8:29 AM

## 2014-11-12 ENCOUNTER — Other Ambulatory Visit: Payer: Self-pay | Admitting: Family Medicine

## 2014-11-13 ENCOUNTER — Encounter: Payer: Self-pay | Admitting: Physical Medicine & Rehabilitation

## 2014-11-13 ENCOUNTER — Encounter: Payer: No Typology Code available for payment source | Attending: Physical Medicine & Rehabilitation

## 2014-11-13 ENCOUNTER — Other Ambulatory Visit: Payer: Self-pay | Admitting: Physical Medicine & Rehabilitation

## 2014-11-13 ENCOUNTER — Ambulatory Visit (HOSPITAL_BASED_OUTPATIENT_CLINIC_OR_DEPARTMENT_OTHER): Payer: No Typology Code available for payment source | Admitting: Physical Medicine & Rehabilitation

## 2014-11-13 VITALS — BP 129/80 | HR 99

## 2014-11-13 DIAGNOSIS — Z5181 Encounter for therapeutic drug level monitoring: Secondary | ICD-10-CM | POA: Diagnosis not present

## 2014-11-13 DIAGNOSIS — G894 Chronic pain syndrome: Secondary | ICD-10-CM

## 2014-11-13 DIAGNOSIS — R269 Unspecified abnormalities of gait and mobility: Secondary | ICD-10-CM | POA: Diagnosis not present

## 2014-11-13 DIAGNOSIS — M47816 Spondylosis without myelopathy or radiculopathy, lumbar region: Secondary | ICD-10-CM | POA: Insufficient documentation

## 2014-11-13 MED ORDER — OXYCODONE HCL 5 MG PO TABS
5.0000 mg | ORAL_TABLET | Freq: Three times a day (TID) | ORAL | Status: DC
Start: 2014-11-13 — End: 2014-12-18

## 2014-11-13 MED ORDER — FENTANYL 25 MCG/HR TD PT72: 25.0000 ug | MEDICATED_PATCH | TRANSDERMAL | Status: DC

## 2014-11-13 NOTE — Patient Instructions (Signed)
Need to wait until the middle of next month to repeat the radiofrequency ablation. Have to wait at least 6 months in between procedures

## 2014-11-13 NOTE — Progress Notes (Signed)
Subjective:    Patient ID: Christine Bradshaw, female    DOB: 04/08/1965, 49 y.o.   MRN: [id number]  HPI   Pain Inventory Average Pain 7 Pain Right Now 8 My pain is sharp, burning and stabbing  In the last 24 hours, has pain interfered with the following? General activity 7 Relation with others 6 Enjoyment of life 8 What TIME of day is your pain at its worst? Morning, Daytime and Evening Sleep (in general) Fair  Pain is worse with: walking, standing and some activites Pain improves with: rest, heat/ice, medication and injections Relief from Meds: 6  Mobility walk without assistance walk with assistance use a walker how many minutes can you walk? 2.5 ability to climb steps?  yes do you drive?  no use a wheelchair transfers alone Do you have any goals in this area?  yes  Function I need assistance with the following:  meal prep, household duties and shopping Do you have any goals in this area?  yes  Neuro/Psych trouble walking spasms depression anxiety  Prior Studies Any changes since last visit?  no  Physicians involved in your care Any changes since last visit?  no   Family History  Problem Relation Age of Onset  . Hyperlipidemia Father   . Hypertension Father   . Heart disease Father   . Colon polyps Neg Hx    Social History   Social History  . Marital Status: Married    Spouse Name: N/A  . Number of Children: 2  . Years of Education: N/A   Occupational History  . disability    Social History Main Topics  . Smoking status: Current Every Day Smoker -- 1.50 packs/day    Types: Cigarettes  . Smokeless tobacco: Never Used  . Alcohol Use: No  . Drug Use: No  . Sexual Activity:    Partners: Male   Other Topics Concern  . None   Social History Narrative   Husband on disability   Pt also on disability   Past Surgical History  Procedure Laterality Date  . Tonsillectomy    . Cholecystectomy    . Appendectomy    .  Esophagogastroduodenoscopy  2010    showed erosions  . Colonoscopy w/ biopsies  2010    adenomatous polyp repeat 2015  . Lumbar fusion  2011    L4-L5-S1 Dr. Marcial Pacas  . Abdominal hysterectomy    . Colonoscopy with propofol N/A 03/13/2013    Procedure: COLONOSCOPY WITH PROPOFOL;  Surgeon: Lafayette Dragon, MD;  Location: [facility];  Service: Endoscopy;  Laterality: N/A;   Past Medical History  Diagnosis Date  . Hypertension   . Depression   . Irritable bowel syndrome   . Diabetes mellitus type II   . Panic attack as reaction to stress   . Insomnia     03-08-13"states is in control"  . Tremor   . Memory loss of unknown cause   . Back pain, chronic   . HX OF GALLSTONE 04/21/2008    Qualifier: Diagnosis of  By: Julaine Hua CMA (Blaine), Estill Bamberg    . HEADACHE, CHRONIC 04/21/2008  . Complication of anesthesia     manic episode  . GERD (gastroesophageal reflux disease)   . H/O hiatal hernia   . Arthritis    BP 129/80 mmHg  Pulse 99  SpO2 98%  Opioid Risk Score:   Fall Risk Score:  `1  Depression screen PHQ 2/9  Depression screen [facility] 2/9 09/16/2014 09/15/2014 03/13/2014 12/17/2013  09/24/2012 09/24/2012  Decreased Interest 2 2 1  0 2 (No Data)  Down, Depressed, Hopeless 2 2 2  0 2 (No Data)  PHQ - 2 Score 4 4 3  0 4 -  Altered sleeping - 1 1 - 1 -  Tired, decreased energy - 2 2 - 1 -  Change in appetite - 1 0 - 1 -  Feeling bad or failure about yourself  - 2 1 - 2 -  Trouble concentrating - 3 2 - 1 -  Moving slowly or fidgety/restless - 0 1 - 2 -  Suicidal thoughts - 0 0 - 0 -  PHQ-9 Score - 13 10 - 12 -  Difficult doing work/chores - Somewhat difficult - - - -     Review of Systems  Gastrointestinal: Positive for constipation.  Musculoskeletal:       Spasms  Neurological:       Gait Instability  Psychiatric/Behavioral: The patient is nervous/anxious.        Depression  All other systems reviewed and are negative.      Objective:   Physical Exam        Assessment & Plan:    Trochanteric bursa injection With  ultrasound guidance  Indication Trochanteric bursitis. Exam has tenderness over the greater trochanter of the hip. Pain has not responded to conservative care such as exercise therapy and oral medications. Pain interferes with sleep or with mobility Informed consent was obtained after describing risks and benefits of the procedure with the patient these include bleeding bruising and infection. Patient has signed written consent form. Patient placed in a lateral decubitus position with the R hip superior.Right greater trochanter was identified with ultrasound at 4.5 cm,marked and prepped with Betadine and 25-gauge 1.5 inch needle was used to anesthetize skin and subcutaneous tissue. 80 mm Echo block needleNeedle slightly withdrawn then 6mg  of betamethasone with 4 cc 1% lidocaine were injected. Patient tolerated procedure well. Post procedure instructions given.

## 2014-11-14 LAB — PMP ALCOHOL METABOLITE (ETG): ETGU: NEGATIVE ng/mL

## 2014-11-18 LAB — OXYCODONE, URINE (LC/MS-MS)
NOROXYCODONE, UR: 4792 ng/mL (ref <50)
OXYCODONE, UR: 693 ng/mL (ref <50)
OXYMORPHONE, URINE: 94 ng/mL (ref <50)

## 2014-11-18 LAB — BENZODIAZEPINES (GC/LC/MS), URINE
Alprazolam metabolite (GC/LC/MS), ur confirm: NEGATIVE ng/mL (ref <25)
Clonazepam metabolite (GC/LC/MS), ur confirm: 2059 ng/mL — AB (ref <25)
Flurazepam metabolite (GC/LC/MS), ur confirm: NEGATIVE ng/mL (ref <50)
LORAZEPAMU: NEGATIVE ng/mL (ref <50)
Midazolam (GC/LC/MS), ur confirm: NEGATIVE ng/mL (ref <50)
NORDIAZEPAMU: NEGATIVE ng/mL (ref <50)
OXAZEPAMU: NEGATIVE ng/mL (ref <50)
Temazepam (GC/LC/MS), ur confirm: NEGATIVE ng/mL (ref <50)
Triazolam metabolite (GC/LC/MS), ur confirm: NEGATIVE ng/mL (ref <50)

## 2014-11-18 LAB — FENTANYL (GC/LC/MS), URINE
Fentanyl, confirm: 2.4 ng/mL (ref <0.5)
Norfentanyl, confirm: 201.3 ng/mL (ref <0.5)

## 2014-11-19 ENCOUNTER — Other Ambulatory Visit: Payer: Self-pay | Admitting: Family Medicine

## 2014-11-19 LAB — PRESCRIPTION MONITORING PROFILE (SOLSTAS)
AMPHETAMINE/METH: NEGATIVE ng/mL
BARBITURATE SCREEN, URINE: NEGATIVE ng/mL
Buprenorphine, Urine: NEGATIVE ng/mL
CANNABINOID SCRN UR: NEGATIVE ng/mL
COCAINE METABOLITES: NEGATIVE ng/mL
CREATININE, URINE: 193.73 mg/dL (ref >20.0)
Carisoprodol, Urine: NEGATIVE ng/mL
MDMA URINE: NEGATIVE ng/mL
MEPERIDINE UR: NEGATIVE ng/mL
Methadone Screen, Urine: NEGATIVE ng/mL
Nitrites, Initial: NEGATIVE ug/mL
OPIATE SCREEN, URINE: NEGATIVE ng/mL
Propoxyphene: NEGATIVE ng/mL
Tapentadol, urine: NEGATIVE ng/mL
Tramadol Scrn, Ur: NEGATIVE ng/mL
ZOLPIDEM, URINE: NEGATIVE ng/mL
pH, Initial: 6.8 pH (ref 4.5–8.9)

## 2014-11-25 NOTE — Progress Notes (Signed)
Urine drug screen for this encounter is consistent for prescribed medication 

## 2014-12-17 ENCOUNTER — Other Ambulatory Visit: Payer: Self-pay | Admitting: Family Medicine

## 2014-12-17 NOTE — Telephone Encounter (Signed)
Rx filled.  Algis Greenhouse. Jerline Pain, [place] Resident PGY-2 12/17/2014 4:45 PM

## 2014-12-18 ENCOUNTER — Encounter: Payer: No Typology Code available for payment source | Attending: Physical Medicine & Rehabilitation

## 2014-12-18 ENCOUNTER — Ambulatory Visit (HOSPITAL_BASED_OUTPATIENT_CLINIC_OR_DEPARTMENT_OTHER): Payer: No Typology Code available for payment source | Admitting: Physical Medicine & Rehabilitation

## 2014-12-18 ENCOUNTER — Encounter: Payer: Self-pay | Admitting: Physical Medicine & Rehabilitation

## 2014-12-18 VITALS — BP 118/64 | HR 75 | Resp 16

## 2014-12-18 DIAGNOSIS — M47816 Spondylosis without myelopathy or radiculopathy, lumbar region: Secondary | ICD-10-CM | POA: Insufficient documentation

## 2014-12-18 MED ORDER — FENTANYL 25 MCG/HR TD PT72: 25.0000 ug | MEDICATED_PATCH | TRANSDERMAL | Status: DC

## 2014-12-18 MED ORDER — OXYCODONE HCL 5 MG PO TABS: 5.0000 mg | ORAL_TABLET | Freq: Three times a day (TID) | ORAL | Status: DC

## 2014-12-18 NOTE — Progress Notes (Signed)
  PROCEDURE RECORD [facility]   Name: Christine Bradshaw DOB:08/17/1965 MRN: [id number]  Date:12/18/2014  Physician: Alysia Penna, MD    Nurse/CMA: Gara Kroner, CMA  Allergies:  Allergies  Allergen Reactions  . Abilify [Aripiprazole]     Shortness of breath, cramps, shakes   . Lithium     Kidneys stop    Consent Signed: Yes.    Is patient diabetic? Yes.    CBG today? 103  Pregnant: No. LMP: No LMP recorded. Patient has had a hysterectomy. (age 48-55)  Anticoagulants: no Anti-inflammatory: no Antibiotics: no  Procedure: Radiofrequency Neurotomy Position: Prone Start Time: 10:11 am End Time: 10:30am Fluoro Time: 12  RN/CMA Inetta Dicke [employer]	[place]    Time 9:41 am 10:30am    BP 118/64 127/66    Pulse 75 77    Respirations 16 16    O2 Sat 96 *96    S/S 6 6    Pain Level 7/10 5/10     D/C home with Daughter, [name] & O X 3, D/C instructions reviewed, and sits independently.

## 2014-12-18 NOTE — Progress Notes (Signed)
Left T12,L1,L2 medial branch radio frequency neurotomy under fluoroscopic guidance   Indication: Low back pain due to lumbar spondylosis which has been relieved on 2 occasions by greater than 50% by lumbar medial branch blocks at corresponding levels.  Informed consent was obtained after describing risks and benefits of the procedure with the patient, this includes bleeding, bruising, infection, paralysis and medication side effects. The patient wishes to proceed and has given written consent. The patient was placed in a prone position. The lumbar and sacral area was marked and prepped with Betadine. A 25-gauge 1-1/2 inch needle was inserted into the skin and subcutaneous tissue at 3 sites in one ML of 1% lidocaine was injected into each site. Then a 20-gauge 15 cm radio frequency needle with a 1 cm curved active tip was inserted targeting the Left L3 SAP/transverse process junction. Bone contact was made and confirmed with lateral imaging. Sensory stimulation at 50 Hz followed by motor stimulation at 2 Hz confirm proper needle location followed by injection of one ML of the solution containing one ML of 4 mg per mL dexamethasone and 3 mL of 1% MPF lidocaine. Then the Left L2 SAP/transverse process junction was targeted. Bone contact was made and confirmed with lateral imaging. Sensory stimulation at 50 Hz followed by motor stimulation at 2 Hz confirm proper needle location followed by injection of one ML of the solution containing one ML of 4 mg per mL dexamethasone and 3 mL of 1% MPF lidocaine. Then the Left L1 SAP/transverse process junction was targeted. Bone contact was made and confirmed with lateral imaging. Sensory stimulation at 50 Hz followed by motor stimulation at 2 Hz confirm proper needle location followed by injection of one ML of the solution containing one ML of 4 mg per mL dexamethasone and 3 mL of 1% MPF lidocaine. Radio frequency lesion being at [facility] for 90 seconds was performed. Needles were  removed. Post procedure instructions and vital signs were performed. Patient tolerated procedure well. Followup appointment was given.  Should be able to use 10 cm RF needle next visit

## 2014-12-18 NOTE — Patient Instructions (Addendum)
You had a radio frequency procedure today This was done to alleviate joint pain in your lumbar area We injected a combination of dexamethasone which is a steroid as well as lidocaine which is a local anesthetic. Dexamethasone made increased blood sugars you are diabetic You may experience soreness at the injection sites. You may also experienced some irritation of the nerves that were heated I'm recommending ice for 30 minutes every 2 hours as needed for the next 24-48 hours   

## 2015-01-04 HISTORY — PX: UPPER GASTROINTESTINAL ENDOSCOPY: SHX188

## 2015-01-19 ENCOUNTER — Telehealth: Payer: Self-pay | Admitting: Family Medicine

## 2015-01-19 MED ORDER — INSULIN GLARGINE 100 UNIT/ML SOLOSTAR PEN: 40.0000 [IU] | PEN_INJECTOR | Freq: Every day | SUBCUTANEOUS | Status: DC

## 2015-01-19 MED ORDER — FUROSEMIDE 40 MG PO TABS: ORAL_TABLET | ORAL | Status: DC

## 2015-01-19 MED ORDER — INSULIN ASPART 100 UNIT/ML ~~LOC~~ SOLN: SUBCUTANEOUS | Status: DC

## 2015-01-19 MED ORDER — PRAVASTATIN SODIUM 40 MG PO TABS: 40.0000 mg | ORAL_TABLET | Freq: Every evening | ORAL | Status: DC

## 2015-01-19 MED ORDER — QUINAPRIL HCL 20 MG PO TABS: 20.0000 mg | ORAL_TABLET | Freq: Every day | ORAL | Status: DC

## 2015-01-19 MED ORDER — METFORMIN HCL 1000 MG PO TABS: 1000.0000 mg | ORAL_TABLET | Freq: Two times a day (BID) | ORAL | Status: DC

## 2015-01-19 MED ORDER — OMEGA-3-ACID ETHYL ESTERS 1 G PO CAPS: 2.0000 g | ORAL_CAPSULE | Freq: Two times a day (BID) | ORAL | Status: DC

## 2015-01-19 MED ORDER — OMEPRAZOLE 40 MG PO CPDR: 40.0000 mg | DELAYED_RELEASE_CAPSULE | Freq: Every day | ORAL | Status: DC

## 2015-01-19 NOTE — Telephone Encounter (Signed)
Will forward to MD. Kalie Cabral,CMA  

## 2015-01-19 NOTE — Telephone Encounter (Signed)
Rx filled for the medications I am actively prescribing the patient. Her other medications (psych and pain meds) will need to be prescribed by her other doctors.  Algis Greenhouse. Jerline Pain, [place] [facility] Resident PGY-2 01/19/2015 5:03 PM

## 2015-01-19 NOTE — Telephone Encounter (Signed)
Pt called because she has a new insurance and they require her to use their mail order pharmacy. [facility] all medication have to be in 3 month prescriptions and for 1 year. The patient needs ALL of her medication sent to her new pharmacy. jw

## 2015-01-22 ENCOUNTER — Other Ambulatory Visit: Payer: Self-pay | Admitting: Physical Medicine & Rehabilitation

## 2015-01-23 ENCOUNTER — Ambulatory Visit: Payer: Self-pay | Admitting: Physical Medicine & Rehabilitation

## 2015-01-26 ENCOUNTER — Encounter: Payer: BLUE CROSS/BLUE SHIELD | Attending: Physical Medicine & Rehabilitation

## 2015-01-26 ENCOUNTER — Ambulatory Visit (HOSPITAL_BASED_OUTPATIENT_CLINIC_OR_DEPARTMENT_OTHER): Payer: BLUE CROSS/BLUE SHIELD | Admitting: Physical Medicine & Rehabilitation

## 2015-01-26 ENCOUNTER — Encounter: Payer: Self-pay | Admitting: Physical Medicine & Rehabilitation

## 2015-01-26 VITALS — BP 102/75 | HR 97 | Resp 14

## 2015-01-26 DIAGNOSIS — M47816 Spondylosis without myelopathy or radiculopathy, lumbar region: Secondary | ICD-10-CM | POA: Insufficient documentation

## 2015-01-26 DIAGNOSIS — M961 Postlaminectomy syndrome, not elsewhere classified: Secondary | ICD-10-CM | POA: Diagnosis not present

## 2015-01-26 MED ORDER — FENTANYL 25 MCG/HR TD PT72: 25.0000 ug | MEDICATED_PATCH | TRANSDERMAL | Status: DC

## 2015-01-26 MED ORDER — OXYCODONE HCL 5 MG PO TABS: 5.0000 mg | ORAL_TABLET | Freq: Three times a day (TID) | ORAL | Status: DC

## 2015-01-26 NOTE — Patient Instructions (Signed)
We will plan on right-sided RF 02/12/18 17

## 2015-01-26 NOTE — Progress Notes (Signed)
Subjective:    Patient ID: Christine Bradshaw, female    DOB: 02-20-1965, 50 y.o.   MRN: [id number]  HPI  Left T12 L1 L2 Patient had left lumbar radiofrequency neurotomy approximately one month ago. She had 3 weeks of soreness after the injection. She felt she had a knot on her back. She was using ice on this. It is doing better now. She was scheduled for repeat right-sided lumbar radiofrequency procedure however insurance has not given approval yet. She is running low on her pain medications. She remains on fentanyl 25 g patch every 3 days as well as oxycodone5 mg twice a day occasionally she'll take 3 a day Pain Inventory Average Pain 7 Pain Right Now 7 My pain is sharp, burning and stabbing  In the last 24 hours, has pain interfered with the following? General activity 8 Relation with others 7 Enjoyment of life 9 What TIME of day is your pain at its worst? morning, daytime, evening Sleep (in general) NA  Pain is worse with: walking, standing and some activites Pain improves with: heat/ice, medication and injections Relief from Meds: 6  Mobility use a walker how many minutes can you walk? 4-5 ability to climb steps?  yes do you drive?  no use a wheelchair transfers alone Do you have any goals in this area?  yes  Function I need assistance with the following:  meal prep, household duties and shopping Do you have any goals in this area?  yes  Neuro/Psych trouble walking spasms depression anxiety  Prior Studies Any changes since last visit?  no  Physicians involved in your care Any changes since last visit?  no   Family History  Problem Relation Age of Onset  . Hyperlipidemia Father   . Hypertension Father   . Heart disease Father   . Colon polyps Neg Hx    Social History   Social History  . Marital Status: Married    Spouse Name: N/A  . Number of Children: 2  . Years of Education: N/A   Occupational History  . disability    Social History Main  Topics  . Smoking status: Current Every Day Smoker -- 1.50 packs/day    Types: Cigarettes  . Smokeless tobacco: Never Used  . Alcohol Use: No  . Drug Use: No  . Sexual Activity:    Partners: Male   Other Topics Concern  . None   Social History Narrative   Husband on disability   Pt also on disability   Past Surgical History  Procedure Laterality Date  . Tonsillectomy    . Cholecystectomy    . Appendectomy    . Esophagogastroduodenoscopy  2010    showed erosions  . Colonoscopy w/ biopsies  2010    adenomatous polyp repeat 2015  . Lumbar fusion  2011    L4-L5-S1 Dr. Marcial Pacas  . Abdominal hysterectomy    . Colonoscopy with propofol N/A 03/13/2013    Procedure: COLONOSCOPY WITH PROPOFOL;  Surgeon: Lafayette Dragon, MD;  Location: [facility];  Service: Endoscopy;  Laterality: N/A;   Past Medical History  Diagnosis Date  . Hypertension   . Depression   . Irritable bowel syndrome   . Diabetes mellitus type II   . Panic attack as reaction to stress   . Insomnia     03-08-13"states is in control"  . Tremor   . Memory loss of unknown cause   . Back pain, chronic   . HX OF GALLSTONE 04/21/2008  Qualifier: Diagnosis of  By: Julaine Hua CMA ([place]), Estill Bamberg    . HEADACHE, CHRONIC 04/21/2008  . Complication of anesthesia     manic episode  . GERD (gastroesophageal reflux disease)   . H/O hiatal hernia   . Arthritis    BP 102/75 mmHg  Pulse 97  Resp 14  SpO2 99%  Opioid Risk Score:   Fall Risk Score:  `1  Depression screen PHQ 2/9  Depression screen [facility] 2/9 09/16/2014 09/15/2014 03/13/2014 12/17/2013 09/24/2012 09/24/2012  Decreased Interest 2 2 1  0 2 (No Data)  Down, Depressed, Hopeless 2 2 2  0 2 (No Data)  PHQ - 2 Score 4 4 3  0 4 -  Altered sleeping - 1 1 - 1 -  Tired, decreased energy - 2 2 - 1 -  Change in appetite - 1 0 - 1 -  Feeling bad or failure about yourself  - 2 1 - 2 -  Trouble concentrating - 3 2 - 1 -  Moving slowly or fidgety/restless - 0 1 - 2 -  Suicidal  thoughts - 0 0 - 0 -  PHQ-9 Score - 13 10 - 12 -  Difficult doing work/chores - Somewhat difficult - - - -      Review of Systems  Musculoskeletal: Positive for gait problem.  Neurological:       Spasms  Psychiatric/Behavioral: Positive for dysphoric mood. The patient is nervous/anxious.   All other systems reviewed and are negative.      Objective:   Physical Exam  Lumbar area shows no evidence of bruising. There is no tenderness to palpation. She has full lumbar flexion but 0 lumbar extension past neutral No tenderness over the hip greater trochanter    Assessment & Plan:   1. Lumbar postlaminectomy syndrome with chronic postoperative pain Continue fentanyl patch 25 g every 72 hours Continue oxycodone 5 mg 1 by mouth twice a day  2. Lumbar spondylosis at levels above the L4-S1 fusions. Will recommend repeating the right T12 L1 L2 medial branch radiofrequency neurotomy Getting good relief from the left T12 L1 L2 medial branch radiofrequency neurotomy after some post procedure pain.

## 2015-02-13 ENCOUNTER — Encounter: Payer: BLUE CROSS/BLUE SHIELD | Attending: Physical Medicine & Rehabilitation

## 2015-02-13 ENCOUNTER — Encounter: Payer: Self-pay | Admitting: Physical Medicine & Rehabilitation

## 2015-02-13 ENCOUNTER — Ambulatory Visit (HOSPITAL_BASED_OUTPATIENT_CLINIC_OR_DEPARTMENT_OTHER): Payer: BLUE CROSS/BLUE SHIELD | Admitting: Physical Medicine & Rehabilitation

## 2015-02-13 VITALS — BP 115/74 | HR 75 | Resp 14

## 2015-02-13 DIAGNOSIS — M7062 Trochanteric bursitis, left hip: Secondary | ICD-10-CM | POA: Diagnosis not present

## 2015-02-13 DIAGNOSIS — M961 Postlaminectomy syndrome, not elsewhere classified: Secondary | ICD-10-CM

## 2015-02-13 DIAGNOSIS — M47816 Spondylosis without myelopathy or radiculopathy, lumbar region: Secondary | ICD-10-CM

## 2015-02-13 MED ORDER — FENTANYL 25 MCG/HR TD PT72: 25.0000 ug | MEDICATED_PATCH | TRANSDERMAL | Status: DC

## 2015-02-13 MED ORDER — OXYCODONE-ACETAMINOPHEN 7.5-325 MG PO TABS: 1.0000 | ORAL_TABLET | Freq: Three times a day (TID) | ORAL | Status: DC | PRN

## 2015-02-13 NOTE — Progress Notes (Signed)
Left Trochanteric bursa injection With  ultrasound guidance  Indication Trochanteric bursitis. Exam has tenderness over the greater trochanter of the hip. Pain has not responded to conservative care such as exercise therapy and oral medications. Pain interferes with sleep or with mobility Informed consent was obtained after describing risks and benefits of the procedure with the patient these include bleeding bruising and infection. Patient has signed written consent form. Patient placed in a lateral decubitus position with the Left hip superior.Right greater trochanter was identified with ultrasound at 4.5 cm,marked and prepped with Betadine and 25-gauge 1.5 inch needle was used to anesthetize skin and subcutaneous tissue. 80 mm Echo block needleNeedle slightly withdrawn then 6mg  of betamethasone with 4 cc 1% lidocaine were injected. Patient tolerated procedure well. Post procedure instructions given.  Patient also wants to discuss her low back pain. Her low back pain is mainly right-sided  She had previous good relief with Left T12 L1 L2 medial branch radiofrequency neurotomy for her left-sided symptoms on 12/18/2014. She had a change of an  Insurance for the new year and the scheduled right T12-L1 and L2 medial branch radiofrequency neurotomy was denied by insurance. She has a long history of low back pain dating back to at least 2010. She underwent physical therapy in 2011 prior to her L4-5 L5-S1 fusion performed in 2011 by Dr. Marcial Pacas.Bilateral T12 L1 L2 medial branch blocks performed on 12/20/2012 as well as to 02/26/2013.  Left T12 L1 L2 medial branch radiofrequency neurotomy performed 04/11/2013, Right T12-L1 and L2 medial branch radiofrequency neurotomy performed 05/07/2013. Patient had post procedure pain reduced from 8/10 to a 4/10.   Fentanyl patch dose was reduced from 50 to 77mcg.  Procedure was repeated 10/15/2013 on the left and 11/14/2013 on the right. Right T12 L1 L2 medial branch  radiofrequency neurotomy repeated 05/08/2014  Examination Gen. No acute distress Mood and affect are appropriate She is tenderness to palpation over the left greater than right greater trochanter of the hip. She also has tenderness over the right greater than left lumbar paraspinal muscles She endplates with a walker. She has no evidence to drag or knee instability.  Impression 1. Lumbosacral spondylosis mostly symptomatic on the right side at the current time because of a successful left-sided T12 L1 L2 radiofrequency neurotomy performed on 12/18/2014. I'm recommending a repeat right-sided T12 L1 L2 radiofrequency neurotomy. Her last procedure was performed on 05/08/2014 and she had at least 6 months relief with this. She apparently changed insurances and is now needing to do an appeal. Because of her increasing pain I will be increasing her oxycodone to 7.5 mg 3 times a day. She will continue with had no past 25 g change every 72 hours. If this is not helpful may need to increase oxycodone to 10 milligrams next month. If she undergoes repeat radiofrequency neurotomy we should be able to reduce her oxycodone down to the 5 mg dose that she usually takes 2 times per day.  I discussed this with the patient I went over her denial letter with her. I wrote a letter to the insurance company  Over half of the 25 min visit was spent counseling and coordinating care.

## 2015-03-13 ENCOUNTER — Encounter: Payer: BLUE CROSS/BLUE SHIELD | Attending: Physical Medicine & Rehabilitation | Admitting: Registered Nurse

## 2015-03-13 ENCOUNTER — Telehealth: Payer: Self-pay | Admitting: Physical Medicine & Rehabilitation

## 2015-03-13 ENCOUNTER — Encounter: Payer: Self-pay | Admitting: Registered Nurse

## 2015-03-13 VITALS — BP 125/75 | HR 81 | Resp 14

## 2015-03-13 DIAGNOSIS — M961 Postlaminectomy syndrome, not elsewhere classified: Secondary | ICD-10-CM

## 2015-03-13 DIAGNOSIS — M47816 Spondylosis without myelopathy or radiculopathy, lumbar region: Secondary | ICD-10-CM | POA: Diagnosis present

## 2015-03-13 DIAGNOSIS — Z79899 Other long term (current) drug therapy: Secondary | ICD-10-CM | POA: Diagnosis not present

## 2015-03-13 DIAGNOSIS — G894 Chronic pain syndrome: Secondary | ICD-10-CM | POA: Diagnosis not present

## 2015-03-13 DIAGNOSIS — Z5181 Encounter for therapeutic drug level monitoring: Secondary | ICD-10-CM

## 2015-03-13 DIAGNOSIS — M7061 Trochanteric bursitis, right hip: Secondary | ICD-10-CM

## 2015-03-13 MED ORDER — FENTANYL 25 MCG/HR TD PT72: 25.0000 ug | MEDICATED_PATCH | TRANSDERMAL | Status: DC

## 2015-03-13 MED ORDER — OXYCODONE-ACETAMINOPHEN 7.5-325 MG PO TABS: 1.0000 | ORAL_TABLET | Freq: Three times a day (TID) | ORAL | Status: DC | PRN

## 2015-03-13 NOTE — Telephone Encounter (Signed)
They will let her [place] out of state--she wanted to let Zella Ball know.

## 2015-03-13 NOTE — Progress Notes (Signed)
Subjective:    Patient ID: Christine Bradshaw, female    DOB: 1965/06/19, 50 y.o.   MRN: [id number]  HPI: [name] is a 50 year old female who returns for follow up for chronic pain and medication refill. She states her pain is located in her lower back and right hip. She rates her pain 8. A Her current exercise regime is walking with her walker for short distances.  Christine Bradshaw will be going to [place] on April 1,2017, her mother is having having surgery. She was given April prescription and prescriptions were postdated. Also instructed to call her insurance company to make sure she will be able to obtain her medication in [place] she verbalizes understanding.  Pain Inventory Average Pain 7 Pain Right Now 8 My pain is sharp, burning and stabbing  In the last 24 hours, has pain interfered with the following? General activity 7 Relation with others 5 Enjoyment of life 8 What TIME of day is your pain at its worst? morning, daytime  Sleep (in general) Good  Pain is worse with: walking, standing and some activites Pain improves with: heat/ice, medication, TENS and injections Relief from Meds: 7  Mobility walk with assistance use a walker how many minutes can you walk? 3-4 use a wheelchair Do you have any goals in this area?  yes  Function not employed: date last employed . I need assistance with the following:  meal prep, household duties and shopping  Neuro/Psych spasms depression anxiety  Prior Studies Any changes since last visit?  no  Physicians involved in your care Any changes since last visit?  no   Family History  Problem Relation Age of Onset  . Hyperlipidemia Father   . Hypertension Father   . Heart disease Father   . Colon polyps Neg Hx    Social History   Social History  . Marital Status: Married    Spouse Name: N/A  . Number of Children: 2  . Years of Education: N/A   Occupational History  . disability    Social  History Main Topics  . Smoking status: Current Every Day Smoker -- 1.50 packs/day    Types: Cigarettes  . Smokeless tobacco: Never Used  . Alcohol Use: No  . Drug Use: No  . Sexual Activity:    Partners: Male   Other Topics Concern  . None   Social History Narrative   Husband on disability   Pt also on disability   Past Surgical History  Procedure Laterality Date  . Tonsillectomy    . Cholecystectomy    . Appendectomy    . Esophagogastroduodenoscopy  2010    showed erosions  . Colonoscopy w/ biopsies  2010    adenomatous polyp repeat 2015  . Lumbar fusion  2011    L4-L5-S1 Dr. Marcial Pacas  . Abdominal hysterectomy    . Colonoscopy with propofol N/A 03/13/2013    Procedure: COLONOSCOPY WITH PROPOFOL;  Surgeon: Lafayette Dragon, MD;  Location: [facility];  Service: Endoscopy;  Laterality: N/A;   Past Medical History  Diagnosis Date  . Hypertension   . Depression   . Irritable bowel syndrome   . Diabetes mellitus type II   . Panic attack as reaction to stress   . Insomnia     03-08-13"states is in control"  . Tremor   . Memory loss of unknown cause   . Back pain, chronic   . HX OF GALLSTONE 04/21/2008    Qualifier:  Diagnosis of  By: Julaine Hua CMA ([place]), Estill Bamberg    . HEADACHE, CHRONIC 04/21/2008  . Complication of anesthesia     manic episode  . GERD (gastroesophageal reflux disease)   . H/O hiatal hernia   . Arthritis    BP 125/75 mmHg  Pulse 81  Resp 14  SpO2 99%  Opioid Risk Score:   Fall Risk Score:  `1  Depression screen PHQ 2/9  Depression screen [facility] 2/9 09/16/2014 09/15/2014 03/13/2014 12/17/2013 09/24/2012 09/24/2012  Decreased Interest 2 2 1  0 2 (No Data)  Down, Depressed, Hopeless 2 2 2  0 2 (No Data)  PHQ - 2 Score 4 4 3  0 4 -  Altered sleeping - 1 1 - 1 -  Tired, decreased energy - 2 2 - 1 -  Change in appetite - 1 0 - 1 -  Feeling bad or failure about yourself  - 2 1 - 2 -  Trouble concentrating - 3 2 - 1 -  Moving slowly or fidgety/restless - 0 1 - 2 -    Suicidal thoughts - 0 0 - 0 -  PHQ-9 Score - 13 10 - 12 -  Difficult doing work/chores - Somewhat difficult - - - -     Review of Systems  All other systems reviewed and are negative.      Objective:   Physical Exam  Constitutional: She is oriented to person, place, and time. She appears well-developed and well-nourished.  HENT:  Head: Normocephalic and atraumatic.  Neck: Normal range of motion. Neck supple.  Cardiovascular: Normal rate and regular rhythm.   Pulmonary/Chest: Effort normal and breath sounds normal.  Musculoskeletal:  Normal Muscle Bulk and Muscle Testing Reveals: Upper Extremities: Full ROM and Muscle Strength 5/5 Lumbar Paraspinal Tenderness: L-3- L-5 Right Greater Trochanteric Tenderness Lower Extremities: Full ROM and Muscle Strength 5/5 Arises from chair slowly using walker for support Narrow Based gait  Neurological: She is alert and oriented to person, place, and time.  Skin: Skin is warm and dry.  Psychiatric: She has a normal mood and affect.  Nursing note and vitals reviewed.         Assessment & Plan:  1. Lumbar postlaminectomy syndrome- status post L4-5 and L5-S1 fusion with chronic postoperative pain as well as a chronic left L5 radiculopathy. Refilled: Fentanyl Patch 25 mcg one patch every three days #10 and Oxycodone 7.5/325 mg one tablet every 8 hours #90. Continue with heat and exercise therapy. 2. Lumbar spondylosis above the level of the fusion: Continue to Monitor 3. Right Greater Trochanteric Bursitis: Continue with heat, Ice therapy and HEP as tolerated.  20 minutes of face to face patient care time was spent during this visit. All questions was encouraged and answered.   F/U in 1 month.

## 2015-03-19 LAB — TOXASSURE SELECT,+ANTIDEPR,UR: PDF: 0

## 2015-03-20 NOTE — Progress Notes (Signed)
Urine drug screen for this encounter is consistent for prescribed medication 

## 2015-04-27 ENCOUNTER — Encounter: Payer: Self-pay | Admitting: Registered Nurse

## 2015-04-27 ENCOUNTER — Encounter: Payer: BLUE CROSS/BLUE SHIELD | Attending: Physical Medicine & Rehabilitation | Admitting: Registered Nurse

## 2015-04-27 VITALS — BP 105/72 | HR 83 | Resp 14

## 2015-04-27 DIAGNOSIS — M7062 Trochanteric bursitis, left hip: Secondary | ICD-10-CM | POA: Diagnosis not present

## 2015-04-27 DIAGNOSIS — G894 Chronic pain syndrome: Secondary | ICD-10-CM

## 2015-04-27 DIAGNOSIS — M7061 Trochanteric bursitis, right hip: Secondary | ICD-10-CM

## 2015-04-27 DIAGNOSIS — Z79899 Other long term (current) drug therapy: Secondary | ICD-10-CM

## 2015-04-27 DIAGNOSIS — M47816 Spondylosis without myelopathy or radiculopathy, lumbar region: Secondary | ICD-10-CM | POA: Insufficient documentation

## 2015-04-27 DIAGNOSIS — M961 Postlaminectomy syndrome, not elsewhere classified: Secondary | ICD-10-CM

## 2015-04-27 DIAGNOSIS — Z5181 Encounter for therapeutic drug level monitoring: Secondary | ICD-10-CM

## 2015-04-27 MED ORDER — METHOCARBAMOL 500 MG PO TABS: 500.0000 mg | ORAL_TABLET | Freq: Three times a day (TID) | ORAL | Status: DC | PRN

## 2015-04-27 MED ORDER — FENTANYL 25 MCG/HR TD PT72: 25.0000 ug | MEDICATED_PATCH | TRANSDERMAL | Status: DC

## 2015-04-27 MED ORDER — OXYCODONE-ACETAMINOPHEN 7.5-325 MG PO TABS: 1.0000 | ORAL_TABLET | Freq: Three times a day (TID) | ORAL | Status: DC | PRN

## 2015-04-27 NOTE — Progress Notes (Signed)
Subjective:    Patient ID: Christine Bradshaw, female    DOB: 11-25-1965, 50 y.o.   MRN: [id number]  HPI: [name] is a 50 year old female who returns for follow up for chronic pain and medication refill. She states her pain is located in her lower back and bilateral hips. She rates her pain 7. A Her current exercise regime is walking with her walker for short distances.  Ms.Hennon returned her oxycodone prescription that was printed on 03/13/15, prescription was discarded.  Pain Inventory Average Pain 7 Pain Right Now 7 My pain is sharp and burning  In the last 24 hours, has pain interfered with the following? General activity 6 Relation with others 5 Enjoyment of life 7 What TIME of day is your pain at its worst? morning Sleep (in general) Fair  Pain is worse with: walking, sitting, standing and some activites Pain improves with: heat/ice, medication, TENS and injections Relief from Meds: 7  Mobility walk with assistance use a walker how many minutes can you walk? 3 ability to climb steps?  yes do you drive?  no use a wheelchair transfers alone Do you have any goals in this area?  yes  Function I need assistance with the following:  meal prep, household duties and shopping Do you have any goals in this area?  yes  Neuro/Psych weakness spasms depression anxiety  Prior Studies Any changes since last visit?  no  Physicians involved in your care Any changes since last visit?  no   Family History  Problem Relation Age of Onset  . Hyperlipidemia Father   . Hypertension Father   . Heart disease Father   . Colon polyps Neg Hx    Social History   Social History  . Marital Status: Married    Spouse Name: N/A  . Number of Children: 2  . Years of Education: N/A   Occupational History  . disability    Social History Main Topics  . Smoking status: Current Every Day Smoker -- 1.50 packs/day    Types: Cigarettes  . Smokeless tobacco: Never  Used  . Alcohol Use: No  . Drug Use: No  . Sexual Activity:    Partners: Male   Other Topics Concern  . None   Social History Narrative   Husband on disability   Pt also on disability   Past Surgical History  Procedure Laterality Date  . Tonsillectomy    . Cholecystectomy    . Appendectomy    . Esophagogastroduodenoscopy  2010    showed erosions  . Colonoscopy w/ biopsies  2010    adenomatous polyp repeat 2015  . Lumbar fusion  2011    L4-L5-S1 Dr. Marcial Pacas  . Abdominal hysterectomy    . Colonoscopy with propofol N/A 03/13/2013    Procedure: COLONOSCOPY WITH PROPOFOL;  Surgeon: Lafayette Dragon, MD;  Location: [facility];  Service: Endoscopy;  Laterality: N/A;   Past Medical History  Diagnosis Date  . Hypertension   . Depression   . Irritable bowel syndrome   . Diabetes mellitus type II   . Panic attack as reaction to stress   . Insomnia     03-08-13"states is in control"  . Tremor   . Memory loss of unknown cause   . Back pain, chronic   . HX OF GALLSTONE 04/21/2008    Qualifier: Diagnosis of  By: Julaine Hua CMA ([place]), Estill Bamberg    . HEADACHE, CHRONIC 04/21/2008  . Complication of anesthesia  manic episode  . GERD (gastroesophageal reflux disease)   . H/O hiatal hernia   . Arthritis    BP 105/72 mmHg  Pulse 83  Resp 14  SpO2 98%  Opioid Risk Score:   Fall Risk Score:  `1  Depression screen PHQ 2/9  Depression screen [facility] 2/9 09/16/2014 09/15/2014 03/13/2014 12/17/2013 09/24/2012 09/24/2012  Decreased Interest 2 2 1  0 2 (No Data)  Down, Depressed, Hopeless 2 2 2  0 2 (No Data)  PHQ - 2 Score 4 4 3  0 4 -  Altered sleeping - 1 1 - 1 -  Tired, decreased energy - 2 2 - 1 -  Change in appetite - 1 0 - 1 -  Feeling bad or failure about yourself  - 2 1 - 2 -  Trouble concentrating - 3 2 - 1 -  Moving slowly or fidgety/restless - 0 1 - 2 -  Suicidal thoughts - 0 0 - 0 -  PHQ-9 Score - 13 10 - 12 -  Difficult doing work/chores - Somewhat difficult - - - -    Review of  Systems  All other systems reviewed and are negative.      Objective:   Physical Exam  Constitutional: She is oriented to person, place, and time. She appears well-developed and well-nourished.  HENT:  Head: Normocephalic and atraumatic.  Neck: Normal range of motion. Neck supple.  Cardiovascular: Normal rate and regular rhythm.   Pulmonary/Chest: Effort normal and breath sounds normal.  Musculoskeletal:  Normal Muscle Bulk and Muscle Testing Reveals: Upper Extremities: Full ROM and Muscle Strength 5/5 Lumbar Paraspinal Tenderness: L-3- L-5 Bilateral Greater Trochanteric Tenderness Lower Extremities: Full ROM and Muscle Strength 5/5 Arises from chair slowly using walker for support Antalgic Gait   Neurological: She is alert and oriented to person, place, and time.  Skin: Skin is warm and dry.  Psychiatric: She has a normal mood and affect.  Nursing note and vitals reviewed.         Assessment & Plan:  1. Lumbar postlaminectomy syndrome- status post L4-5 and L5-S1 fusion with chronic postoperative pain as well as a chronic left L5 radiculopathy. Refilled: Fentanyl Patch 25 mcg one patch every three days #10 and Oxycodone 7.5/325 mg one tablet every 8 hours #90. We will continue the opioid monitoring program, this consists of regular clinic visits, examinations, urine drug screen, pill counts as well as use of [place] Controlled Substance Reporting System. Continue with heat and exercise therapy. 2. Lumbar spondylosis above the level of the fusion: Continue to Monitor 3. Bilateral Greater Trochanteric Bursitis: Schedule Bilateral U/S Guided Bursa Injections with Dr. Letta Pate.  Continue with heat, Ice therapy and HEP as tolerated.  20 minutes of face to face patient care time was spent during this visit. All questions was encouraged and answered.   F/U in 1 month.

## 2015-05-05 ENCOUNTER — Ambulatory Visit (INDEPENDENT_AMBULATORY_CARE_PROVIDER_SITE_OTHER): Payer: BLUE CROSS/BLUE SHIELD | Admitting: Family Medicine

## 2015-05-05 ENCOUNTER — Encounter: Payer: Self-pay | Admitting: Family Medicine

## 2015-05-05 VITALS — BP 123/74 | Temp 98.0°F | Ht 64.0 in | Wt 241.0 lb

## 2015-05-05 DIAGNOSIS — Z1239 Encounter for other screening for malignant neoplasm of breast: Secondary | ICD-10-CM

## 2015-05-05 DIAGNOSIS — Z1159 Encounter for screening for other viral diseases: Secondary | ICD-10-CM | POA: Diagnosis not present

## 2015-05-05 DIAGNOSIS — Z794 Long term (current) use of insulin: Secondary | ICD-10-CM

## 2015-05-05 DIAGNOSIS — Z23 Encounter for immunization: Secondary | ICD-10-CM | POA: Diagnosis not present

## 2015-05-05 DIAGNOSIS — E118 Type 2 diabetes mellitus with unspecified complications: Secondary | ICD-10-CM | POA: Diagnosis not present

## 2015-05-05 DIAGNOSIS — R21 Rash and other nonspecific skin eruption: Secondary | ICD-10-CM | POA: Diagnosis not present

## 2015-05-05 DIAGNOSIS — Z114 Encounter for screening for human immunodeficiency virus [HIV]: Secondary | ICD-10-CM

## 2015-05-05 DIAGNOSIS — I10 Essential (primary) hypertension: Secondary | ICD-10-CM

## 2015-05-05 DIAGNOSIS — E785 Hyperlipidemia, unspecified: Secondary | ICD-10-CM

## 2015-05-05 DIAGNOSIS — E119 Type 2 diabetes mellitus without complications: Secondary | ICD-10-CM

## 2015-05-05 LAB — LIPID PANEL
CHOL/HDL RATIO: 3.7 ratio (ref <=5.0)
Cholesterol: 110 mg/dL — ABNORMAL LOW (ref 125–200)
HDL: 30 mg/dL — AB (ref >=46)
LDL Cholesterol: 40 mg/dL (ref <130)
TRIGLYCERIDES: 201 mg/dL — AB (ref <150)
VLDL: 40 mg/dL — ABNORMAL HIGH (ref <30)

## 2015-05-05 LAB — CBC
HEMATOCRIT: 30.1 % — AB (ref 35.0–45.0)
Hemoglobin: 9 g/dL — ABNORMAL LOW (ref 11.7–15.5)
MCH: 20.6 pg — ABNORMAL LOW (ref 27.0–33.0)
MCHC: 29.9 g/dL — ABNORMAL LOW (ref 32.0–36.0)
MCV: 69 fL — ABNORMAL LOW (ref 80.0–100.0)
MPV: 9.4 fL (ref 7.5–12.5)
PLATELETS: 451 10*3/uL — AB (ref 140–400)
RBC: 4.36 MIL/uL (ref 3.80–5.10)
RDW: 17.2 % — AB (ref 11.0–15.0)
WBC: 10.7 10*3/uL (ref 3.8–10.8)

## 2015-05-05 LAB — COMPLETE METABOLIC PANEL WITH GFR
ALBUMIN: 4.3 g/dL (ref 3.6–5.1)
ALK PHOS: 65 U/L (ref 33–130)
ALT: 18 U/L (ref 6–29)
AST: 24 U/L (ref 10–35)
BUN: 12 mg/dL (ref 7–25)
CHLORIDE: 103 mmol/L (ref 98–110)
CO2: 25 mmol/L (ref 20–31)
Calcium: 9.6 mg/dL (ref 8.6–10.4)
Creat: 0.78 mg/dL (ref 0.50–1.05)
GFR, Est African American: >89 mL/min (ref >=60)
GFR, Est Non African American: 88 mL/min (ref >=60)
GLUCOSE: 68 mg/dL (ref 65–99)
POTASSIUM: 4.3 mmol/L (ref 3.5–5.3)
SODIUM: 139 mmol/L (ref 135–146)
Total Bilirubin: 0.3 mg/dL (ref 0.2–1.2)
Total Protein: 6.5 g/dL (ref 6.1–8.1)

## 2015-05-05 LAB — POCT GLYCOSYLATED HEMOGLOBIN (HGB A1C): Hemoglobin A1C: 5.4

## 2015-05-05 LAB — POCT SKIN KOH: SKIN KOH, POC: NEGATIVE

## 2015-05-05 MED ORDER — INSULIN GLARGINE 100 UNIT/ML SOLOSTAR PEN: 32.0000 [IU] | PEN_INJECTOR | Freq: Every day | SUBCUTANEOUS | Status: DC

## 2015-05-05 MED ORDER — HYDROCORTISONE 0.5 % EX CREA: 1.0000 "application " | TOPICAL_CREAM | Freq: Two times a day (BID) | CUTANEOUS | Status: DC

## 2015-05-05 MED ORDER — KETOCONAZOLE 2 % EX CREA: 1.0000 "application " | TOPICAL_CREAM | Freq: Every day | CUTANEOUS | Status: DC

## 2015-05-05 NOTE — Assessment & Plan Note (Signed)
Appearance consistent with tinea pedis. Also consider dishydrotic eczema, though less likely. No signs of bacterial infection today. What patient reports as pustules may be bacterial super-infection. Will treat with empiric ketoconazole. If not improving, will try topical steroids. If still not improving, consider biopsy vs dermatology referral.

## 2015-05-05 NOTE — Patient Instructions (Addendum)
Decrease your lantus to 32units  We will check blood work today.  I think your rash is probably a fungal infection. Use the cream twice daily. If it is not improving, try using the hydrocortisone cream.  Please get your mammogram soon.  See you again in 3 months,  Take care,  Dr Jerline Pain

## 2015-05-05 NOTE — Progress Notes (Signed)
    Subjective:  Christine Bradshaw is a 50 y.o. female who presents to the [facility] today with a chief complaint of T2DM follow up.   HPI: T2DM Currently on lantus 35U daily. Has not had to use novolog. Also on metformin twice daily. Is tolerating this regimen without side effects. She did report a couple episodes of low blood sugars associated with palpitations and diaphoresis. States that she drank a soda and the symptoms resolved. No polyuria or polydipsia.  CBGs: Lows 70s. Highs in 150s.   Hypertension BP Readings from Last 3 Encounters:  05/05/15 123/74  04/27/15 105/72  03/13/15 125/75   Home BP monitoring-yes Compliant with medications-yes, without side effects ROS-Denies any CP, HA, SOB, blurry vision, LE edema, transient weakness, orthopnea, PND.   HLD On pravastatin. No side effects.   Rash on Left Foot Patient also with several year history of a rash on the sole of her left foot. Says that it starts as a "blister" then drains pus, scabs over, then goes away. This cycle repeats every few months. No pruritis. Lesions are not painful until the become very large. She has not tried anything for the rash.   HCM Due for mammogram, HIV, HCV, and pneumonia vaccine.   ROS: Per HPI  PMH: Smoking history reviewed.    Objective:  Physical Exam: BP 123/74 mmHg  Temp(Src) 98 F (36.7 C) (Oral)  Ht 5\' 4"  (1.626 m)  Wt 241 lb (109.317 kg)  BMI 41.35 kg/m2  Gen: NAD, resting comfortably CV: RRR with no murmurs appreciated Lungs: NWOB, CTAB with no crackles, wheezes, or rhonchi GI: Normal bowel sounds present. Soft, Nontender, Nondistended. MSK: no edema, cyanosis, or clubbing noted Skin: Sole of left foot with pink, macerated skin with 2-3 well healing lesions. No areas of skin break down. Neuro: grossly normal, moves all extremities Psych: Normal affect and thought content  Results for orders placed or performed in visit on 05/05/15 (from the past 72 hour(s))  POCT Skin  KOH     Status: None   Collection Time: 05/05/15  2:41 PM  Result Value Ref Range   Skin KOH, POC Negative   POCT glycosylated hemoglobin (Hb A1C)     Status: None   Collection Time: 05/05/15  2:41 PM  Result Value Ref Range   Hemoglobin A1C 5.4    Assessment/Plan:  Diabetes mellitus, type II, insulin dependent A1c 5.4 today. Will decrease Lantus to 32U daily. Follow up in 3 months. If A1c still, low will decrease dose further.   Essential hypertension At goal. Will continue accupril  Hyperlipidemia Continue pravastatin. Check lipid panel. Will likely need switch to high intensity statin.   Rash and nonspecific skin eruption Appearance consistent with tinea pedis. Also consider dishydrotic eczema, though less likely. No signs of bacterial infection today. What patient reports as pustules may be bacterial super-infection. Will treat with empiric ketoconazole. If not improving, will try topical steroids. If still not improving, consider biopsy vs dermatology referral.   Algis Greenhouse. Jerline Pain, [place] Resident PGY-2 05/05/2015 4:50 PM

## 2015-05-05 NOTE — Assessment & Plan Note (Signed)
At goal. Will continue accupril

## 2015-05-05 NOTE — Assessment & Plan Note (Signed)
A1c 5.4 today. Will decrease Lantus to 32U daily. Follow up in 3 months. If A1c still, low will decrease dose further.

## 2015-05-05 NOTE — Assessment & Plan Note (Signed)
Continue pravastatin. Check lipid panel. Will likely need switch to high intensity statin.

## 2015-05-06 ENCOUNTER — Telehealth: Payer: Self-pay | Admitting: Family Medicine

## 2015-05-06 LAB — HIV ANTIBODY (ROUTINE TESTING W REFLEX): HIV 1&2 Ab, 4th Generation: NONREACTIVE

## 2015-05-06 LAB — HEPATITIS C ANTIBODY: HCV Ab: NEGATIVE

## 2015-05-06 NOTE — Telephone Encounter (Signed)
Left message for pt to return my call. Page, cma.

## 2015-05-06 NOTE — Telephone Encounter (Signed)
Patient informed of message from MD, appointment scheduled.

## 2015-05-11 ENCOUNTER — Ambulatory Visit (HOSPITAL_BASED_OUTPATIENT_CLINIC_OR_DEPARTMENT_OTHER): Payer: BLUE CROSS/BLUE SHIELD | Admitting: Physical Medicine & Rehabilitation

## 2015-05-11 ENCOUNTER — Encounter: Payer: Self-pay | Admitting: Physical Medicine & Rehabilitation

## 2015-05-11 ENCOUNTER — Encounter: Payer: BLUE CROSS/BLUE SHIELD | Attending: Physical Medicine & Rehabilitation

## 2015-05-11 ENCOUNTER — Other Ambulatory Visit: Payer: Self-pay | Admitting: Family Medicine

## 2015-05-11 VITALS — BP 115/73 | HR 94

## 2015-05-11 DIAGNOSIS — M7062 Trochanteric bursitis, left hip: Secondary | ICD-10-CM | POA: Diagnosis not present

## 2015-05-11 DIAGNOSIS — M47816 Spondylosis without myelopathy or radiculopathy, lumbar region: Secondary | ICD-10-CM | POA: Insufficient documentation

## 2015-05-11 DIAGNOSIS — M7061 Trochanteric bursitis, right hip: Secondary | ICD-10-CM | POA: Diagnosis not present

## 2015-05-11 DIAGNOSIS — M961 Postlaminectomy syndrome, not elsewhere classified: Secondary | ICD-10-CM

## 2015-05-11 DIAGNOSIS — Z1231 Encounter for screening mammogram for malignant neoplasm of breast: Secondary | ICD-10-CM

## 2015-05-11 MED ORDER — FENTANYL 25 MCG/HR TD PT72: 25.0000 ug | MEDICATED_PATCH | TRANSDERMAL | Status: DC

## 2015-05-11 NOTE — Patient Instructions (Signed)

## 2015-05-11 NOTE — Progress Notes (Signed)
Bilateral Trochanteric bursa injection With  ultrasound guidance  Indication Trochanteric bursitis. Exam has tenderness over the greater trochanter of the hip. Pain has not responded to conservative care such as exercise therapy and oral medications. Pain interferes with sleep or with mobility Informed consent was obtained after describing risks and benefits of the procedure with the patient these include bleeding bruising and infection. Patient has signed written consent form. Patient placed in a Left lateral decubitus position with the affected hip superior. Right Greater trochanter was visualized using curvilinear transducer area marked and prepped with Betadine.22-gauge 80 mm Echo block needle utilized And advanced to target the greater trochanter.Needle slightly withdrawn then 6mg  of betamethasone with 4 cc 1% lidocaine were injected. Same procedure was. On the left side Using same needle and technique as well as same injectate Patient tolerated procedure well. Post procedure instructions given.  We also discussed the difficulty she has been having any getting Repeat lumbar radiofrequency Procedure approved by her new insurance company. She's had previous excellent relief of greater than 6 months from prior radiofrequency procedures. Reviewed some of the insurance company criteria including need for printed home exercise program which I have done for the patient today. In addition, Criteria includes physical therapy which has been done in the past but we have reordered today.  Return to clinic in 3 Weeks for reevaluation after the above interventions

## 2015-05-14 ENCOUNTER — Ambulatory Visit: Payer: BLUE CROSS/BLUE SHIELD | Attending: Physical Medicine & Rehabilitation | Admitting: Physical Therapy

## 2015-05-14 ENCOUNTER — Encounter: Payer: Self-pay | Admitting: Physical Therapy

## 2015-05-14 DIAGNOSIS — M6281 Muscle weakness (generalized): Secondary | ICD-10-CM

## 2015-05-14 DIAGNOSIS — M545 Low back pain, unspecified: Secondary | ICD-10-CM

## 2015-05-14 DIAGNOSIS — M25551 Pain in right hip: Secondary | ICD-10-CM

## 2015-05-14 DIAGNOSIS — M25552 Pain in left hip: Secondary | ICD-10-CM | POA: Diagnosis present

## 2015-05-14 NOTE — Patient Instructions (Signed)
Knee-to-Chest Stretch: Unilateral    With hand behind right knee, pull knee in to chest until a comfortable stretch is felt in lower back and buttocks. Keep back relaxed. Hold __30__ seconds. Repeat __2__ times per set. Do __1__ sets per session. Do __2__ sessions per day.  http://orth.exer.us/126   Copyright  VHI. All rights reserved.   Piriformis Stretch, Supine    Lie supine, legs bent, feet flat. Raise one bent leg and, grasping ankle with both hands, pull leg toward opposite shoulder. Hold _30__ seconds.  Repeat __2_ times per session. Do ___ sessions per day. Perform with other leg straight. Only do without pain just a stretch. Copyright  VHI. All rights reserved.   Chair Sitting    Sit at edge of seat, spine straight, one leg extended. Put a hand on each thigh and bend forward from the hip, keeping spine straight. Allow hand on extended leg to reach toward toes. Support upper body with other arm. Hold _30__ seconds. Repeat _2__ times per session. Do __2_ sessions per day.  Copyright  VHI. All rights reserved.  [facility] [address] [place], [place] [address] Phone # [phone] Fax [phone]  Knee-to-Chest Stretch: Unilateral    With hand behind right knee, pull knee in to chest until a comfortable stretch is felt in lower back and buttocks. Keep back relaxed. Hold _30___ seconds. Repeat _2___ times per set. Do _1___ sets per session. Do __2__ sessions per day.  http://orth.exer.us/126   Copyright  VHI. All rights reserved.   [facility] [address] [place], [place] [address] Phone # [phone] Fax [phone]

## 2015-05-14 NOTE — Patient Instructions (Signed)
Knee-to-Chest Stretch: Unilateral    With hand behind right knee, pull knee in to chest until a comfortable stretch is felt in lower back and buttocks. Keep back relaxed. Hold __30__ seconds. Repeat __2__ times per set. Do __1__ sets per session. Do __2__ sessions per day.  http://orth.exer.us/126   Copyright  VHI. All rights reserved.   Piriformis Stretch, Supine    Lie supine, legs bent, feet flat. Raise one bent leg and, grasping ankle with both hands, pull leg toward opposite shoulder. Hold _30__ seconds.  Repeat __2_ times per session. Do ___ sessions per day. Perform with other leg straight. Only do without pain just a stretch. Copyright  VHI. All rights reserved.   Chair Sitting    Sit at edge of seat, spine straight, one leg extended. Put a hand on each thigh and bend forward from the hip, keeping spine straight. Allow hand on extended leg to reach toward toes. Support upper body with other arm. Hold _30__ seconds. Repeat _2__ times per session. Do __2_ sessions per day.  Copyright  VHI. All rights reserved.  [facility] [address], [place], [place] [address] Phone # [phone] Fax [phone]  Knee-to-Chest Stretch: Unilateral    With hand behind right knee, pull knee in to chest until a comfortable stretch is felt in lower back and buttocks. Keep back relaxed. Hold _30___ seconds. Repeat _2___ times per set. Do _1___ sets per session. Do __2__ sessions per day.  http://orth.exer.us/126   Copyright  VHI. All rights reserved.   [facility] [address], [place], [place] [address] Phone # [phone] Fax [phone]

## 2015-05-14 NOTE — Therapy (Signed)
[facility] [address] [place], [place], [address] Phone: [phone]   Fax:  [phone]  Physical Therapy Evaluation  Patient Details  Name: Christine Bradshaw MRN: [id number] Date of Birth: [birth year] Referring Provider: Dr. Alysia Penna  Encounter Date: 05/14/2015      PT End of Session - 05/14/15 1255    Visit Number 1   Date for PT Re-Evaluation 07/09/15   PT Start Time 1230   PT Stop Time 1300   PT Time Calculation (min) 30 min   Activity Tolerance Patient tolerated treatment well   Behavior During Therapy [facility] for tasks assessed/performed      Past Medical History  Diagnosis Date  . Hypertension   . Depression   . Irritable bowel syndrome   . Diabetes mellitus type II   . Panic attack as reaction to stress   . Insomnia     03-08-13"states is in control"  . Tremor   . Memory loss of unknown cause   . Back pain, chronic   . HX OF GALLSTONE 04/21/2008    Qualifier: Diagnosis of  By: Julaine Hua CMA ([place]), Estill Bamberg    . HEADACHE, CHRONIC 04/21/2008  . Complication of anesthesia     manic episode  . GERD (gastroesophageal reflux disease)   . H/O hiatal hernia   . Arthritis     Past Surgical History  Procedure Laterality Date  . Tonsillectomy    . Cholecystectomy    . Appendectomy    . Esophagogastroduodenoscopy  2010    showed erosions  . Colonoscopy w/ biopsies  2010    adenomatous polyp repeat 2015  . Lumbar fusion  2011    L4-L5-S1 Dr. Marcial Pacas  . Abdominal hysterectomy    . Colonoscopy with propofol N/A 03/13/2013    Procedure: COLONOSCOPY WITH PROPOFOL;  Surgeon: Lafayette Dragon, MD;  Location: [facility];  Service: Endoscopy;  Laterality: N/A;    There were no vitals filed for this visit.       Subjective Assessment - 05/14/15 1232    Subjective Patient reports pain in back and hips.  Patient had back surgery.  Had therapy 6 years ago prior to surgery. After surgery her pain is  getting worse.  Patient is getting injections and burn the nerves.    Patient Stated Goals reduce pain and improve mobility   Currently in Pain? Yes   Pain Score 7    Pain Location Back  bil. 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 Alysia Penna   Onset Date/Surgical Date 11/03/09   Prior Therapy prior to lumbar surgery   Precautions   Precautions Other (comment)   Precaution Comments seizures   Restrictions   Weight Bearing Restrictions No   Balance Screen   Has the patient fallen in the past 6 months No   Has the patient had a decrease in activity level because of a fear of  falling?  No   Is the patient reluctant to leave their home because of a fear of falling?  No   Home Environment   Living Environment Private residence   Type of [place] to enter   Entrance Stairs-Number of Steps 1   [place] One level   Prior Function   Level of Independence Independent with basic ADLs   Vocation On disability   Cognition   Overall Cognitive Status Within Functional Limits for tasks assessed   Posture/Postural Control   Posture/Postural Control Postural limitations   Postural Limitations Rounded Shoulders;Forward head;Flexed trunk   ROM / Strength   AROM / PROM / Strength AROM;Strength   AROM   Overall AROM  Within functional limits for tasks performed   Strength   Overall Strength Comments abdominal strength is 2/5   Right Hip Flexion 4/5   Right Hip  Extension 3/5   Right Hip External Rotation  4-/5   Right Hip Internal Rotation 4-/5   Right Hip ABduction 3/5   Left Hip Flexion 4/5   Left Hip Extension 3/5   Left Hip External Rotation 4-/5   Left Hip Internal Rotation 4-/5   Left Hip ABduction 3/5   Ambulation/Gait   Ambulation/Gait Yes   Assistive device 4-wheeled walker   Gait Pattern Step-through pattern;Trunk flexed  walk slowly   Stairs Yes   Stairs Assistance 6: Modified independent (Device/Increase time);4: Min assist   Stair Management Technique Step to pattern                           PT Education - 05/14/15 1302    Education provided Yes   Education Details flexibility exercises   Person(s) Educated Patient   Methods Explanation;Demonstration;Verbal cues;Handout   Comprehension Returned demonstration;Verbalized understanding          PT Short Term Goals - 05/14/15 1253    PT SHORT TERM GOAL #1   Title independent with initial HEP for stretches   Time 4   Period Weeks   Status New   PT SHORT TERM GOAL #2   Title understand correct body mechanics with home tasks and bed mobility using abdominal bracing   Time 4   Period Weeks   Status New   PT SHORT TERM GOAL #3   Title understand ways to manage pain with daily tasks   Time 4   Period Weeks   Status New           PT Long Term Goals - 05/14/15 1246    PT LONG TERM GOAL #1   Title independent with HEP   Time 8   Period Weeks   Status New   PT LONG TERM GOAL #2   Title be able to walk in a store for 20 minutes with walker and not be in a buggy due to increase strength of bilateral legs and moderate pain   Time 8   Period Weeks   Status New   PT LONG TERM GOAL #3   Title clean home including wash dishes with minimal difficulty   Time 8   Period Weeks   Status New   PT LONG TERM GOAL #4   Title walk for 500 feet in yard with a walker due to improve bilateral lower extremity strength and increased endurance   Time 8    Period Weeks   Status New   PT LONG TERM GOAL #5  Title FOTO score </= 55% limitation   Time 8   Period Weeks   Status New               Plan - 05/14/15 1304    Clinical Impression Statement Patient is a 50 year old female with diagnosis of post lumbar fusion on 11/2009.  Patient presently gets injections in her spine and nerves burned.  Patient is unable to walk in the store and has to use a buggy.  Patient got  a rolling walker due to difficutly with walking. Patient reports her lumbar pain and hip pain is 7/10 constantly with standing, walking, and activity. Patient  walks slowly, with small steps, using a rolling walker and does step to step pattern on stairs. Bilateral hip strength is 3/5 to 4/5.  Abdominal strength is 2/5.  Patient has full bilateral hip ROM due to just having shots in her hips. Patient is  of moderate  complexity due to pain is worsening, lumbar fusion, seizures, and unable to do househlod chores or walk without an assistive device due to pain.  Patient will benefit from physical therapy to imporve mobility and pain management.    Rehab Potential Good   Clinical Impairments Affecting Rehab Potential seizures   PT Frequency 2x / week   PT Duration 8 weeks   PT Treatment/Interventions ADLs/Self Care Home Management;Cryotherapy;Electrical Stimulation;Ultrasound;Moist Heat;Functional mobility training;Therapeutic activities;Therapeutic exercise;Balance training;Patient/family education;Neuromuscular re-education;Manual techniques;Passive range of motion;Energy conservation   PT Next Visit Plan modalities for pain, hip strength and ROM, trunk flexion exercises   PT Home Exercise Plan hip exercises   Recommended Other Services None   Consulted and Agree with Plan of Care Patient      Patient will benefit from skilled therapeutic intervention in order to improve the following deficits and impairments:  Pain, Difficulty walking, Decreased strength, Decreased range of  motion, Decreased endurance, Decreased activity tolerance, Decreased mobility, Increased muscle spasms  Visit Diagnosis: Muscle weakness (generalized) - Plan: PT plan of care cert/re-cert  Bilateral low back pain without sciatica - Plan: PT plan of care cert/re-cert  Pain in right hip - Plan: PT plan of care cert/re-cert  Pain in left hip - Plan: PT plan of care cert/re-cert      G-Codes - [id number]    Functional Assessment Tool Used FOTO score is 66% limitation CL  goal is 55% limitaiton   Functional Limitation Other PT primary   Other PT Primary Current Status [id number]) At least 60 percent but less than 80 percent impaired, limited or restricted   Other PT Primary Goal Status [id number]) At least 40 percent but less than 60 percent impaired, limited or restricted       Problem List Patient Active Problem List   Diagnosis Date Noted  . Trochanteric bursitis of both hips 05/11/2015  . Rash and nonspecific skin eruption 05/05/2015  . Constipation 09/15/2014  . Lumbar post-laminectomy syndrome 07/03/2014  . Altered awareness, transient 04/02/2014  . Seizure-like activity ([place]) 03/05/2014  . Spondylosis of lumbar region without myelopathy or radiculopathy 10/15/2013  . Healthcare maintenance 10/01/2013  . Special screening for malignant neoplasms, colon 03/13/2013  . Tobacco abuse 04/25/2011  . Skin lesion of left leg 04/21/2011  . Venous insufficiency 03/11/2010  . Diabetic neuropathy, painful ([place]) 03/10/2010  . BIPOLAR DISORDER UNSPECIFIED 06/05/2009  . BACK PAIN, CHRONIC 06/05/2009  . Obesity, morbid, BMI 50 or higher ([place]) 04/21/2008  . ANXIETY 04/21/2008  . Essential hypertension 04/21/2008  . Diabetes mellitus, type II,  insulin dependent ([place]) 04/17/2008  . Hyperlipidemia 04/17/2008  . Irritable bowel syndrome 04/17/2008  . COLONIC POLYPS, HYPERPLASTIC, HX OF 04/17/2008    Earlie Counts, PT 05/14/2015 1:15 PM    [facility] [address] [place], [place], [address] Phone: [phone]   Fax:  [phone]  Name: Christine Bradshaw MRN: [id number] Date of Birth: Oct 23, 1965

## 2015-05-15 ENCOUNTER — Encounter: Payer: Self-pay | Admitting: Family Medicine

## 2015-05-15 ENCOUNTER — Ambulatory Visit (INDEPENDENT_AMBULATORY_CARE_PROVIDER_SITE_OTHER): Payer: BLUE CROSS/BLUE SHIELD | Admitting: Family Medicine

## 2015-05-15 VITALS — BP 113/68 | HR 91 | Temp 98.2°F | Ht 64.0 in | Wt 239.9 lb

## 2015-05-15 DIAGNOSIS — D509 Iron deficiency anemia, unspecified: Secondary | ICD-10-CM | POA: Diagnosis not present

## 2015-05-15 DIAGNOSIS — D649 Anemia, unspecified: Secondary | ICD-10-CM

## 2015-05-15 LAB — CBC
HCT: 29.9 % — ABNORMAL LOW (ref 35.0–45.0)
HEMOGLOBIN: 8.9 g/dL — AB (ref 11.7–15.5)
MCH: 20.3 pg — ABNORMAL LOW (ref 27.0–33.0)
MCHC: 29.8 g/dL — AB (ref 32.0–36.0)
MCV: 68.1 fL — ABNORMAL LOW (ref 80.0–100.0)
MPV: 9.2 fL (ref 7.5–12.5)
Platelets: 438 10*3/uL — ABNORMAL HIGH (ref 140–400)
RBC: 4.39 MIL/uL (ref 3.80–5.10)
RDW: 17.6 % — AB (ref 11.0–15.0)
WBC: 14 10*3/uL — AB (ref 3.8–10.8)

## 2015-05-15 LAB — IRON,TIBC AND FERRITIN PANEL
%SAT: 3 % — AB (ref 11–50)
FERRITIN: 5 ng/mL — AB (ref 10–232)
Iron: 17 ug/dL — ABNORMAL LOW (ref 45–160)
TIBC: 514 ug/dL — ABNORMAL HIGH (ref 250–450)

## 2015-05-15 LAB — POCT UA - MICROSCOPIC ONLY

## 2015-05-15 LAB — POCT URINALYSIS DIPSTICK
Bilirubin, UA: NEGATIVE
Blood, UA: NEGATIVE
Glucose, UA: NEGATIVE
Nitrite, UA: NEGATIVE
PH UA: 6
Protein, UA: NEGATIVE
UROBILINOGEN UA: 0.2

## 2015-05-15 LAB — POCT HEMOGLOBIN: HEMOGLOBIN: 9.2 g/dL — AB (ref 12.2–16.2)

## 2015-05-15 MED ORDER — FERROUS SULFATE 325 (65 FE) MG PO TABS: 325.0000 mg | ORAL_TABLET | Freq: Three times a day (TID) | ORAL | Status: DC

## 2015-05-15 NOTE — Patient Instructions (Signed)
Your blood counts are low. We will start you on iron supplementation. Please take 1 pill three times a day. This works best on an Systems developer. If this causes upset stomach, please take with food.  We will send you to see a GI doctor - you are probably losing blood in your GI system.  Please see me again in 1 month to recheck your blood counts.  Take care,  Dr Jerline Pain

## 2015-05-15 NOTE — Assessment & Plan Note (Signed)
Likely GI loss. Hgb 9.2 today (15 last year).  S/p hysterectomy. UA negative for blood. Had erosions on EGD 7 years ago. Moderate diverticulosis on colonoscopy 2 years ago - doubt this is the source of bleeding as patient has no hematochezia, however may be a slower chronic bleed.  -Will refer to GI.  -Follow up in 1 month.  -Will check iron studies and retic count. -Will start iron supplementation.

## 2015-05-15 NOTE — Progress Notes (Signed)
    Subjective:  Christine Bradshaw is a 50 y.o. female who presents to the [facility] today with a chief complaint of anemia follow up.   HPI:  Microcytic Anemia Patient seen in clinic last week, had routine screening labs and found to have a Hgb of 9.0. Last year had Hgb 15.3. Appears to be in the 11-15 range over the past 4-5 years.  Had colonoscopy 2 years ago - found to have moderate diverticulosis. Per chart review patient was found to have small erosions in EGD in 2010. Denies NSAID use. No dyspepsia or epigastric pain. [name] is s/p hysterectomy. No hematuria. No melena or hematochezia.   ROS: Per HPI  Objective:  Physical Exam: BP 113/68 mmHg  Pulse 91  Temp(Src) 98.2 F (36.8 C) (Oral)  Ht 5\' 4"  (1.626 m)  Wt 239 lb 14.4 oz (108.818 kg)  BMI 41.16 kg/m2  Gen: NAD, resting comfortably CV: RRR with no murmurs appreciated Pulm: NWOB, CTAB with no crackles, wheezes, or rhonchi GI: Normal bowel sounds present. Soft, Nontender, Nondistended. MSK: no edema, cyanosis, or clubbing noted Skin: warm, dry Neuro: grossly normal, moves all extremities Psych: Normal affect and thought content  Assessment/Plan:  Microcytic anemia Likely GI loss. Hgb 9.2 today (15 last year).  S/p hysterectomy. UA negative for blood. Had erosions on EGD 7 years ago. Moderate diverticulosis on colonoscopy 2 years ago - doubt this is the source of bleeding as patient has no hematochezia, however may be a slower chronic bleed.  -Will refer to GI.  -Follow up in 1 month.  -Will check iron studies and retic count. -Will start iron supplementation.  Algis Greenhouse. Jerline Pain, [place] Resident PGY-2 05/15/2015 4:10 PM

## 2015-05-16 LAB — RETICULOCYTES
ABS Retic: 101430 cells/uL — ABNORMAL HIGH (ref 20000–80000)
RBC.: 4.41 MIL/uL (ref 3.80–5.10)
RETIC CT PCT: 2.3 %

## 2015-05-21 ENCOUNTER — Encounter: Payer: Self-pay | Admitting: Physical Therapy

## 2015-05-21 ENCOUNTER — Ambulatory Visit: Payer: BLUE CROSS/BLUE SHIELD | Admitting: Physical Therapy

## 2015-05-21 DIAGNOSIS — M25551 Pain in right hip: Secondary | ICD-10-CM

## 2015-05-21 DIAGNOSIS — M25552 Pain in left hip: Secondary | ICD-10-CM

## 2015-05-21 DIAGNOSIS — M545 Low back pain, unspecified: Secondary | ICD-10-CM

## 2015-05-21 DIAGNOSIS — M6281 Muscle weakness (generalized): Secondary | ICD-10-CM

## 2015-05-21 NOTE — Therapy (Signed)
[facility] [address], [place], [place], [address] Phone: [phone]   Fax:  [phone]  Physical Therapy Treatment  Patient Details  Name: Christine Bradshaw MRN: [id number] Date of Birth: [birth year] Referring Provider: Dr. Alysia Penna  Encounter Date: 05/21/2015      PT End of Session - 05/21/15 1528    Visit Number 2   Date for PT Re-Evaluation 07/09/15   PT Start Time 1445   PT Stop Time 1525   PT Time Calculation (min) 40 min   Activity Tolerance Patient tolerated treatment well   Behavior During Therapy [facility] for tasks assessed/performed      Past Medical History  Diagnosis Date  . Hypertension   . Depression   . Irritable bowel syndrome   . Diabetes mellitus type II   . Panic attack as reaction to stress   . Insomnia     03-08-13"states is in control"  . Tremor   . Memory loss of unknown cause   . Back pain, chronic   . HX OF GALLSTONE 04/21/2008    Qualifier: Diagnosis of  By: Julaine Hua CMA ([place]), Estill Bamberg    . HEADACHE, CHRONIC 04/21/2008  . Complication of anesthesia     manic episode  . GERD (gastroesophageal reflux disease)   . H/O hiatal hernia   . Arthritis     Past Surgical History  Procedure Laterality Date  . Tonsillectomy    . Cholecystectomy    . Appendectomy    . Esophagogastroduodenoscopy  2010    showed erosions  . Colonoscopy w/ biopsies  2010    adenomatous polyp repeat 2015  . Lumbar fusion  2011    L4-L5-S1 Dr. Marcial Pacas  . Abdominal hysterectomy    . Colonoscopy with propofol N/A 03/13/2013    Procedure: COLONOSCOPY WITH PROPOFOL;  Surgeon: Lafayette Dragon, MD;  Location: [facility];  Service: Endoscopy;  Laterality: N/A;    There were no vitals filed for this visit.      Subjective Assessment - 05/21/15 1449    Subjective I put ice on my back.  I feel rough. I have been doing my exercises. Pain is worse since doing the exercises.    Patient Stated Goals reduce  pain and improve mobility   Currently in Pain? Yes   Pain Score 8    Pain Location Back   Pain Orientation Lower   Pain Descriptors / Indicators Constant  spasms in left leg   Pain Type Chronic pain   Pain Onset More than a month ago   Pain Frequency Constant   Pain Relieving Factors muscle relaxers                         [facility] Adult PT Treatment/Exercise - 05/21/15 0001    Manual Therapy   Manual Therapy Soft tissue mobilization;Myofascial release   Manual therapy comments used a tissue lifter to release the scar superiorly in sidely   Soft tissue mobilization bilateral quadratus release, coccygeus release, abdominal fascia laterally  sidely   Myofascial Release tissue rolling along the lumbar and thoracic spine, scar releasae  sidely                PT Education - 05/21/15 1527    Education provided Yes   Education Details only do the exercises that are not hurting her   Person(s) Educated Patient   Methods Explanation   Comprehension Verbalized understanding  PT Short Term Goals - 05/21/15 1530    PT SHORT TERM GOAL #1   Title independent with initial HEP for stretches   Time 4   Period Weeks   Status On-going   PT SHORT TERM GOAL #2   Title understand correct body mechanics with home tasks and bed mobility using abdominal bracing   Time 4   Period Weeks   Status On-going   PT SHORT TERM GOAL #3   Title understand ways to manage pain with daily tasks   Time 4   Period Weeks   Status On-going           PT Long Term Goals - 05/14/15 1246    PT LONG TERM GOAL #1   Title independent with HEP   Time 8   Period Weeks   Status New   PT LONG TERM GOAL #2   Title be able to walk in a store for 20 minutes with walker and not be in a buggy due to increase strength of bilateral legs and moderate pain   Time 8   Period Weeks   Status New   PT LONG TERM GOAL #3   Title clean home including wash dishes with minimal difficulty    Time 8   Period Weeks   Status New   PT LONG TERM GOAL #4   Title walk for 500 feet in yard with a walker due to improve bilateral lower extremity strength and increased endurance   Time 8   Period Weeks   Status New   PT LONG TERM GOAL #5   Title FOTO score </= 55% limitation   Time 8   Period Weeks   Status New               Plan - 05/21/15 1528    Clinical Impression Statement Patient is a 50 year old female with diagnosis of post lumbar fusion.  Patient was doing the HEP and has increased pain and muscle spasm.  therapist tried different fascial techniques to the lumbar and sacral area.  No change in pain but patient was able to stand straighter.  Patient has not met goals due ot just staring therapy.    Rehab Potential Good   Clinical Impairments Affecting Rehab Potential seizures   PT Frequency 2x / week   PT Duration 8 weeks   PT Treatment/Interventions ADLs/Self Care Home Management;Cryotherapy;Electrical Stimulation;Ultrasound;Moist Heat;Functional mobility training;Therapeutic activities;Therapeutic exercise;Balance training;Patient/family education;Neuromuscular re-education;Manual techniques;Passive range of motion;Energy conservation   PT Next Visit Plan modalities for pain, hip strength and ROM, trunk flexion exercises   PT Home Exercise Plan hip exercises   Consulted and Agree with Plan of Care Patient      Patient will benefit from skilled therapeutic intervention in order to improve the following deficits and impairments:  Pain, Difficulty walking, Decreased strength, Decreased range of motion, Decreased endurance, Decreased activity tolerance, Decreased mobility, Increased muscle spasms  Visit Diagnosis: Bilateral low back pain without sciatica  Muscle weakness (generalized)  Pain in right hip  Pain in left hip     Problem List Patient Active Problem List   Diagnosis Date Noted  . Microcytic anemia 05/15/2015  . Trochanteric bursitis of both  hips 05/11/2015  . Rash and nonspecific skin eruption 05/05/2015  . Constipation 09/15/2014  . Lumbar post-laminectomy syndrome 07/03/2014  . Altered awareness, transient 04/02/2014  . Seizure-like activity ([place]) 03/05/2014  . Spondylosis of lumbar region without myelopathy or radiculopathy 10/15/2013  . Healthcare maintenance 10/01/2013  .  Special screening for malignant neoplasms, colon 03/13/2013  . Tobacco abuse 04/25/2011  . Skin lesion of left leg 04/21/2011  . Venous insufficiency 03/11/2010  . Diabetic neuropathy, painful ([place]) 03/10/2010  . BIPOLAR DISORDER UNSPECIFIED 06/05/2009  . BACK PAIN, CHRONIC 06/05/2009  . Obesity, morbid, BMI 50 or higher ([place]) 04/21/2008  . ANXIETY 04/21/2008  . Essential hypertension 04/21/2008  . Diabetes mellitus, type II, insulin dependent ([place]) 04/17/2008  . Hyperlipidemia 04/17/2008  . Irritable bowel syndrome 04/17/2008  . COLONIC POLYPS, HYPERPLASTIC, HX OF 04/17/2008    Earlie Counts, PT 05/21/2015 3:31 PM   [facility] [address], [place], [place], [address] Phone: [phone]   Fax:  [phone]  Name: Christine Bradshaw MRN: [id number] Date of Birth: [birth year]

## 2015-05-25 ENCOUNTER — Ambulatory Visit: Payer: BLUE CROSS/BLUE SHIELD | Admitting: Physical Medicine & Rehabilitation

## 2015-05-25 ENCOUNTER — Ambulatory Visit (HOSPITAL_BASED_OUTPATIENT_CLINIC_OR_DEPARTMENT_OTHER): Payer: BLUE CROSS/BLUE SHIELD | Admitting: Physical Medicine & Rehabilitation

## 2015-05-25 ENCOUNTER — Encounter: Payer: Self-pay | Admitting: Physical Medicine & Rehabilitation

## 2015-05-25 VITALS — BP 108/69 | HR 92 | Resp 14

## 2015-05-25 DIAGNOSIS — M47816 Spondylosis without myelopathy or radiculopathy, lumbar region: Secondary | ICD-10-CM

## 2015-05-25 DIAGNOSIS — M7062 Trochanteric bursitis, left hip: Secondary | ICD-10-CM

## 2015-05-25 DIAGNOSIS — M7061 Trochanteric bursitis, right hip: Secondary | ICD-10-CM | POA: Diagnosis not present

## 2015-05-25 MED ORDER — OXYCODONE-ACETAMINOPHEN 7.5-325 MG PO TABS: 1.0000 | ORAL_TABLET | Freq: Three times a day (TID) | ORAL | Status: DC | PRN

## 2015-05-25 NOTE — Progress Notes (Signed)
Subjective:    Patient ID: Christine Bradshaw, female    DOB: Mar 25, 1965, 51 y.o.   MRN: [id number]  HPI Chief complaint is left greater than right-sided low back pain. 50 year old female with history of lumbar spondylosis above the level of her L4-S1 lumbar fusion. She's had good relief with lumbar radiofrequency ablation performed in December 2016 at left T12 L1 L2. This is starting to wear off. She continues to have pain on the right side in her upper lumbar area as well.  She is currently undergoing physical therapy for her low back pain and when doing stretching such as chest hugs. She had increasing pain. Physical therapy then did some massage and some of her pain was relieved. Thus far therapy has not helped with her chronic low back pain. She was originally scheduled for 5 weeks of therapy however due to lack of progress her therapist told her that they will likely only do a couple more visits. Next visits are tomorrow and Thursday of this week.  She has undergone bilateral trochanteric bursa injection of the hips under ultrasound guidance and her hip pain has improved.  She remains on narcotic analgesics fentanyl 25 mcg patch as well as oxycodone 7.5 3 times a day  Patient has goal of getting off the fentanyl patch she is concerned that this may be causing side effect of hair loss.  Pain Inventory Average Pain 8 Pain Right Now 8 My pain is sharp, burning and stabbing  In the last 24 hours, has pain interfered with the following? General activity 7 Relation with others 6 Enjoyment of life 7 What TIME of day is your pain at its worst? morning, daytime Sleep (in general) Good  Pain is worse with: walking, sitting, standing and some activites Pain improves with: heat/ice, medication and injections Relief from Meds: 6  Mobility walk with assistance use a walker how many minutes can you walk? 3-4 ability to climb steps?  yes do you drive?  no use a wheelchair transfers  alone Do you have any goals in this area?  yes  Function I need assistance with the following:  meal prep, household duties and shopping Do you have any goals in this area?  yes  Neuro/Psych weakness spasms depression anxiety  Prior Studies Any changes since last visit?  no  Physicians involved in your care Any changes since last visit?  no   Family History  Problem Relation Age of Onset  . Hyperlipidemia Father   . Hypertension Father   . Heart disease Father   . Colon polyps Neg Hx    Social History   Social History  . Marital Status: Married    Spouse Name: N/A  . Number of Children: 2  . Years of Education: N/A   Occupational History  . disability    Social History Main Topics  . Smoking status: Current Every Day Smoker -- 1.50 packs/day    Types: Cigarettes  . Smokeless tobacco: Never Used  . Alcohol Use: No  . Drug Use: No  . Sexual Activity:    Partners: Male   Other Topics Concern  . None   Social History Narrative   Husband on disability   Pt also on disability   Past Surgical History  Procedure Laterality Date  . Tonsillectomy    . Cholecystectomy    . Appendectomy    . Esophagogastroduodenoscopy  2010    showed erosions  . Colonoscopy w/ biopsies  2010    adenomatous polyp  repeat 2015  . Lumbar fusion  2011    L4-L5-S1 Dr. Marcial Pacas  . Abdominal hysterectomy    . Colonoscopy with propofol N/A 03/13/2013    Procedure: COLONOSCOPY WITH PROPOFOL;  Surgeon: Lafayette Dragon, MD;  Location: [facility];  Service: Endoscopy;  Laterality: N/A;   Past Medical History  Diagnosis Date  . Hypertension   . Depression   . Irritable bowel syndrome   . Diabetes mellitus type II   . Panic attack as reaction to stress   . Insomnia     03-08-13"states is in control"  . Tremor   . Memory loss of unknown cause   . Back pain, chronic   . HX OF GALLSTONE 04/21/2008    Qualifier: Diagnosis of  By: Julaine Hua CMA ([place]), Estill Bamberg    . HEADACHE, CHRONIC  04/21/2008  . Complication of anesthesia     manic episode  . GERD (gastroesophageal reflux disease)   . H/O hiatal hernia   . Arthritis    BP 108/69 mmHg  Pulse 92  Resp 14  SpO2 99%  Opioid Risk Score:   Fall Risk Score:  `1  Depression screen PHQ 2/9  Depression screen [facility] 2/9 05/15/2015 05/05/2015 09/16/2014 09/15/2014 03/13/2014 12/17/2013 09/24/2012  Decreased Interest 0 0 2 2 1  0 2  Down, Depressed, Hopeless 0 0 2 2 2  0 2  PHQ - 2 Score 0 0 4 4 3  0 4  Altered sleeping - - - 1 1 - 1  Tired, decreased energy - - - 2 2 - 1  Change in appetite - - - 1 0 - 1  Feeling bad or failure about yourself  - - - 2 1 - 2  Trouble concentrating - - - 3 2 - 1  Moving slowly or fidgety/restless - - - 0 1 - 2  Suicidal thoughts - - - 0 0 - 0  PHQ-9 Score - - - 13 10 - 12  Difficult doing work/chores - - - Somewhat difficult - - -     Review of Systems  All other systems reviewed and are negative.      Objective:   Physical Exam  Constitutional: She is oriented to person, place, and time. She appears well-developed and well-nourished.  HENT:  Head: Normocephalic and atraumatic.  Eyes: Conjunctivae and EOM are normal. Pupils are equal, round, and reactive to light.  Musculoskeletal:       Right hip: She exhibits normal range of motion and no tenderness.       Left hip: She exhibits normal range of motion and no tenderness.  Neurological: She is alert and oriented to person, place, and time.  Psychiatric: She has a normal mood and affect.  Nursing note and vitals reviewed.  Tenderness over the lumbar area around L4 bilaterally. Patient has full range with lumbar flexion however lumbar extension is limited to 25% and accompanied by pain in the lumbar area. Patient does have tingling with palpation over the lateral thigh as well as anterior leg into the dorsum of the foot on the left side only. There is no evidence of weakness or atrophy. No tenderness over the trochanteric bursa area.     Assessment & Plan:  1. Chronic low back pain she has undergone L4 through S1 fusion with Lumbar spondylosis above the level of the fusion which has responded to upper lumbar medial branch blocks and radiofrequency neurotomy. The last radiofrequency neurotomy was performed in December 2016 and is starting to wear off.  The patient has been receiving physical therapy for her Low back pain complaints and is not experiencing any reduction in her pain, although her standing posture is straighter. I reviewed her therapy notes from 05/21/2015.  Her pain persist despite narcotic analgesic medications.She feels she is getting some hair loss from the fentanyl patch, I looked this up and was not reported as a side effect although she states that other patients have reported this in online forums.  She would benefit from repeat T12, L1, L2 radiofrequency neurotomy procedure on the left side since this is more symptomatic. Will schedule for one month. The plan would then be to perform the right side T12, L1, L2 1 month afterwards.  There is a good chance that once she has bilateral T12 L1 L2 radiofrequency neurotomies that she can wean off the fentanyl patch. This could be decreased to 12 g for 2-4 weeks and then discontinued and the patient would stay on her oxycodone 7.5, 3 times a day.  2. Trochanteric bursitis improved after ultrasound guided injections. She is able to distinguish between the 2 areas that cause pain.  We discussed at length her treatment options Over half of the 25 min visit was spent counseling and coordinating care.

## 2015-05-25 NOTE — Patient Instructions (Signed)
We will schedule for radiofrequency neurotomy but as you know will need to get insurance approval

## 2015-05-26 ENCOUNTER — Ambulatory Visit: Payer: BLUE CROSS/BLUE SHIELD | Admitting: Physical Therapy

## 2015-05-26 ENCOUNTER — Ambulatory Visit
Admission: RE | Admit: 2015-05-26 | Discharge: 2015-05-26 | Disposition: A | Discharge status: Home / Self Care | Payer: BLUE CROSS/BLUE SHIELD | Source: Ambulatory Visit | Attending: Family Medicine | Admitting: Family Medicine

## 2015-05-26 ENCOUNTER — Encounter: Payer: Self-pay | Admitting: Physical Therapy

## 2015-05-26 DIAGNOSIS — M545 Low back pain, unspecified: Secondary | ICD-10-CM

## 2015-05-26 DIAGNOSIS — M25551 Pain in right hip: Secondary | ICD-10-CM

## 2015-05-26 DIAGNOSIS — M6281 Muscle weakness (generalized): Secondary | ICD-10-CM

## 2015-05-26 DIAGNOSIS — M25552 Pain in left hip: Secondary | ICD-10-CM

## 2015-05-26 DIAGNOSIS — Z1231 Encounter for screening mammogram for malignant neoplasm of breast: Secondary | ICD-10-CM

## 2015-05-26 NOTE — Patient Instructions (Addendum)
  ABDUCTION: Standing (Active)    Bring you leg to the side, watch for your toes they look forward   Stand, feet flat. Lift right leg out to side. Use _0__ lbs. Complete __10_ repetitions. Perform __2_ sessions per day.       EXTENSION: Standing (Active)  Stand, both feet flat. Draw right leg behind body as far as possible. Use 0___ lbs. Complete 10 repetitions, each leg.  Perform __2_ sessions per day.  Knee High   Holding stable object, raise knee to hip level, then lower knee. Repeat with other knee. Complete __10_ repetitions. Do __2__ sessions per day.    Copyright  VHI. All rights reserved.   [facility] [address] [place], [place] [address] Phone # [phone] Fax [phone]

## 2015-05-26 NOTE — Therapy (Signed)
[facility] [address] [place], [place], [address] Phone: [phone]   Fax:  [phone]  Physical Therapy Treatment  Patient Details  Name: Christine Bradshaw MRN: [id number] Date of Birth: 1965/06/12 Referring Provider: Dr. Alysia Penna  Encounter Date: 05/26/2015      PT End of Session - 05/26/15 1035    Visit Number 3   Date for PT Re-Evaluation 07/09/15   PT Start Time 1016   PT Stop Time 1059   PT Time Calculation (min) 43 min   Activity Tolerance Patient tolerated treatment well   Behavior During Therapy [facility] for tasks assessed/performed      Past Medical History  Diagnosis Date  . Hypertension   . Depression   . Irritable bowel syndrome   . Diabetes mellitus type II   . Panic attack as reaction to stress   . Insomnia     03-08-13"states is in control"  . Tremor   . Memory loss of unknown cause   . Back pain, chronic   . HX OF GALLSTONE 04/21/2008    Qualifier: Diagnosis of  By: Julaine Hua CMA ([place]), Estill Bamberg    . HEADACHE, CHRONIC 04/21/2008  . Complication of anesthesia     manic episode  . GERD (gastroesophageal reflux disease)   . H/O hiatal hernia   . Arthritis     Past Surgical History  Procedure Laterality Date  . Tonsillectomy    . Cholecystectomy    . Appendectomy    . Esophagogastroduodenoscopy  2010    showed erosions  . Colonoscopy w/ biopsies  2010    adenomatous polyp repeat 2015  . Lumbar fusion  2011    L4-L5-S1 Dr. Marcial Pacas  . Abdominal hysterectomy    . Colonoscopy with propofol N/A 03/13/2013    Procedure: COLONOSCOPY WITH PROPOFOL;  Surgeon: Lafayette Dragon, MD;  Location: [facility];  Service: Endoscopy;  Laterality: N/A;    There were no vitals filed for this visit.      Subjective Assessment - 05/26/15 1026    Subjective Pt is using Merry walker, She rates her pain with sitting as 7/10 and ambulation with RW as 8/10.    Patient Stated Goals reduce pain and  improve mobility   Currently in Pain? Yes   Pain Score 7    Pain Location Back   Pain Orientation Lower   Pain Descriptors / Indicators Constant   Pain Type Chronic pain   Pain Onset More than a month ago   Pain Frequency Constant   Aggravating Factors  stand 2 min, walking is painful   Pain Relieving Factors muscle relaxer   Multiple Pain Sites No                         [facility] Adult PT Treatment/Exercise - 05/26/15 0001    Posture/Postural Control   Posture/Postural Control Postural limitations   Postural Limitations Rounded Shoulders;Forward head;Flexed trunk   Exercises   Exercises Lumbar;Knee/Hip   Lumbar Exercises: Stretches   Active Hamstring Stretch 3 reps;20 seconds  in sitting   Lumbar Exercises: Seated   Long Arc Quad on Chair Strengthening;Both;20 reps   Knee/Hip Exercises: Standing   Hip Flexion Stengthening;Both;20 reps  with sitting restbreaks b/w   Hip ADduction Both;2 sets;10 reps  with B UE support   Hip Extension Stengthening;Both;2 sets;10 reps                PT Education -  05/26/15 1058    Education provided Yes   Education Details standing hip ABD/ Ext/ flexion at sink   Person(s) Educated Patient   Methods Explanation;Handout   Comprehension Verbalized understanding;Returned demonstration          PT Short Term Goals - 05/26/15 1045    PT SHORT TERM GOAL #1   Title independent with initial HEP for stretches   Time 4   Period Weeks   Status On-going   PT SHORT TERM GOAL #2   Title understand correct body mechanics with home tasks and bed mobility using abdominal bracing   Time 4   Period Weeks   Status On-going   PT SHORT TERM GOAL #3   Title understand ways to manage pain with daily tasks   Time 4   Period Weeks   Status On-going           PT Long Term Goals - 05/14/15 1246    PT LONG TERM GOAL #1   Title independent with HEP   Time 8   Period Weeks   Status New   PT LONG TERM GOAL #2   Title be  able to walk in a store for 20 minutes with walker and not be in a buggy due to increase strength of bilateral legs and moderate pain   Time 8   Period Weeks   Status New   PT LONG TERM GOAL #3   Title clean home including wash dishes with minimal difficulty   Time 8   Period Weeks   Status New   PT LONG TERM GOAL #4   Title walk for 500 feet in yard with a walker due to improve bilateral lower extremity strength and increased endurance   Time 8   Period Weeks   Status New   PT LONG TERM GOAL #5   Title FOTO score </= 55% limitation   Time 8   Period Weeks   Status New               Plan - 05/26/15 1037    Clinical Impression Statement Pt is a 50 Year old female with diagnosis of post lumbar fusion and chronic pain. Pt was able to tolerate low level strengthening exericses in sitting. Pt reported usually recieving injection every 6 month, as of today did not had it for 9 month. Pt are not progressing toward goals due to chronic pain.    Rehab Potential Good   Clinical Impairments Affecting Rehab Potential seizures   PT Frequency 2x / week   PT Duration 8 weeks   PT Treatment/Interventions ADLs/Self Care Home Management;Cryotherapy;Electrical Stimulation;Ultrasound;Moist Heat;Functional mobility training;Therapeutic activities;Therapeutic exercise;Balance training;Patient/family education;Neuromuscular re-education;Manual techniques;Passive range of motion;Energy conservation   PT Next Visit Plan Review exercises hip standing exercises, modalities for pain, hip strength and ROM, trunk flexion exercises   PT Home Exercise Plan current exercises   Consulted and Agree with Plan of Care Patient      Patient will benefit from skilled therapeutic intervention in order to improve the following deficits and impairments:  Pain, Difficulty walking, Decreased strength, Decreased range of motion, Decreased endurance, Decreased activity tolerance, Decreased mobility, Increased muscle  spasms  Visit Diagnosis: Muscle weakness (generalized)  Bilateral low back pain without sciatica  Pain in right hip  Pain in left hip     Problem List Patient Active Problem List   Diagnosis Date Noted  . Microcytic anemia 05/15/2015  . Trochanteric bursitis of both hips 05/11/2015  . Rash and  nonspecific skin eruption 05/05/2015  . Constipation 09/15/2014  . Lumbar post-laminectomy syndrome 07/03/2014  . Altered awareness, transient 04/02/2014  . Seizure-like activity ([place]) 03/05/2014  . Spondylosis of lumbar region without myelopathy or radiculopathy 10/15/2013  . Healthcare maintenance 10/01/2013  . Special screening for malignant neoplasms, colon 03/13/2013  . Tobacco abuse 04/25/2011  . Skin lesion of left leg 04/21/2011  . Venous insufficiency 03/11/2010  . Diabetic neuropathy, painful ([place]) 03/10/2010  . BIPOLAR DISORDER UNSPECIFIED 06/05/2009  . BACK PAIN, CHRONIC 06/05/2009  . Obesity, morbid, BMI 50 or higher ([place]) 04/21/2008  . ANXIETY 04/21/2008  . Essential hypertension 04/21/2008  . Diabetes mellitus, type II, insulin dependent ([place]) 04/17/2008  . Hyperlipidemia 04/17/2008  . Irritable bowel syndrome 04/17/2008  . COLONIC POLYPS, HYPERPLASTIC, HX OF 04/17/2008    NAUMANN-HOUEGNIFIO,Shakevia Sarris PTA 05/26/2015, 1:33 PM  [facility] [address], [place], [place], [address] Phone: [phone]   Fax:  [phone]  Name: Christine Bradshaw MRN: [id number] Date of Birth: [birth year]

## 2015-05-28 ENCOUNTER — Ambulatory Visit: Payer: BLUE CROSS/BLUE SHIELD | Admitting: Physical Therapy

## 2015-05-28 ENCOUNTER — Encounter: Payer: Self-pay | Admitting: Physical Therapy

## 2015-05-28 DIAGNOSIS — M545 Low back pain, unspecified: Secondary | ICD-10-CM

## 2015-05-28 DIAGNOSIS — M25551 Pain in right hip: Secondary | ICD-10-CM

## 2015-05-28 DIAGNOSIS — M6281 Muscle weakness (generalized): Secondary | ICD-10-CM

## 2015-05-28 DIAGNOSIS — M25552 Pain in left hip: Secondary | ICD-10-CM

## 2015-05-28 NOTE — Therapy (Signed)
[facility] [address] [place], [place], [address] Phone: [phone]   Fax:  [phone]  Physical Therapy Treatment  Patient Details  Name: Christine Bradshaw MRN: [id number] Date of Birth: April 14, 1965 Referring Provider: Dr. Alysia Penna  Encounter Date: 05/28/2015      PT End of Session - 05/28/15 1029    Visit Number 4   Date for PT Re-Evaluation 07/09/15   PT Start Time 1014   PT Stop Time 1100   PT Time Calculation (min) 46 min   Activity Tolerance Patient tolerated treatment well   Behavior During Therapy [facility] for tasks assessed/performed      Past Medical History  Diagnosis Date  . Hypertension   . Depression   . Irritable bowel syndrome   . Diabetes mellitus type II   . Panic attack as reaction to stress   . Insomnia     03-08-13"states is in control"  . Tremor   . Memory loss of unknown cause   . Back pain, chronic   . HX OF GALLSTONE 04/21/2008    Qualifier: Diagnosis of  By: Julaine Hua CMA ([place]), Estill Bamberg    . HEADACHE, CHRONIC 04/21/2008  . Complication of anesthesia     manic episode  . GERD (gastroesophageal reflux disease)   . H/O hiatal hernia   . Arthritis     Past Surgical History  Procedure Laterality Date  . Tonsillectomy    . Cholecystectomy    . Appendectomy    . Esophagogastroduodenoscopy  2010    showed erosions  . Colonoscopy w/ biopsies  2010    adenomatous polyp repeat 2015  . Lumbar fusion  2011    L4-L5-S1 Dr. Marcial Pacas  . Abdominal hysterectomy    . Colonoscopy with propofol N/A 03/13/2013    Procedure: COLONOSCOPY WITH PROPOFOL;  Surgeon: Lafayette Dragon, MD;  Location: [facility];  Service: Endoscopy;  Laterality: N/A;    There were no vitals filed for this visit.      Subjective Assessment - 05/28/15 1026    Subjective Pt is using Merry walker. She rates her pain with sitting as 6/10 and ambulation with RW rated as 7/10   Patient Stated Goals reduce pain and  improve mobility   Currently in Pain? Yes   Pain Score 6    Pain Location Back   Pain Orientation Lower   Pain Descriptors / Indicators Constant   Pain Type Chronic pain   Pain Onset More than a month ago   Pain Frequency Constant   Aggravating Factors  stand 2 min, walking is painful   Pain Relieving Factors muscle relaxer   Multiple Pain Sites No                         [facility] Adult PT Treatment/Exercise - 05/28/15 0001    Posture/Postural Control   Posture/Postural Control Postural limitations   Postural Limitations Rounded Shoulders;Forward head;Flexed trunk   Exercises   Exercises Lumbar;Knee/Hip;Shoulder   Lumbar Exercises: Stretches   Active Hamstring Stretch 3 reps;20 seconds  in sitting   Lumbar Exercises: Seated   Long Arc Quad on Chair Strengthening;Both;20 reps  1# added   Knee/Hip Exercises: Standing   Hip Flexion Stengthening;Both;20 reps  1# with 1 sitting restbreak   Hip ADduction Both;2 sets;10 reps  1 # added, sitting restbreak   Hip Extension Stengthening;Both;2 sets;10 reps  1# sitting restbreak   Shoulder Exercises: Seated   Other Seated  Exercises 3 way raises x10 each direction, pt tolerated well   Shoulder Exercises: Pulleys   Flexion 2 minutes  x 2, pt tolerated well                  PT Short Term Goals - 05/26/15 1045    PT SHORT TERM GOAL #1   Title independent with initial HEP for stretches   Time 4   Period Weeks   Status On-going   PT SHORT TERM GOAL #2   Title understand correct body mechanics with home tasks and bed mobility using abdominal bracing   Time 4   Period Weeks   Status On-going   PT SHORT TERM GOAL #3   Title understand ways to manage pain with daily tasks   Time 4   Period Weeks   Status On-going           PT Long Term Goals - 05/14/15 1246    PT LONG TERM GOAL #1   Title independent with HEP   Time 8   Period Weeks   Status New   PT LONG TERM GOAL #2   Title be able to walk in  a store for 20 minutes with walker and not be in a buggy due to increase strength of bilateral legs and moderate pain   Time 8   Period Weeks   Status New   PT LONG TERM GOAL #3   Title clean home including wash dishes with minimal difficulty   Time 8   Period Weeks   Status New   PT LONG TERM GOAL #4   Title walk for 500 feet in yard with a walker due to improve bilateral lower extremity strength and increased endurance   Time 8   Period Weeks   Status New   PT LONG TERM GOAL #5   Title FOTO score </= 55% limitation   Time 8   Period Weeks   Status New               Plan - 05/28/15 1029    Clinical Impression Statement Pt tolerated increase of gentle exercises well and has good motivation. standing tolerance is limited has to have sitting restbreaks with standing exercises.      Rehab Potential Good   Clinical Impairments Affecting Rehab Potential 2011 lumbar fusion, chronic pain. recieving injections every 6 month's, as of now didn't had shot for 75month now due to change in insurance. Needs to do PT first before recieving injections.    PT Frequency 2x / week   PT Duration 8 weeks   PT Treatment/Interventions ADLs/Self Care Home Management;Cryotherapy;Electrical Stimulation;Ultrasound;Moist Heat;Functional mobility training;Therapeutic activities;Therapeutic exercise;Balance training;Patient/family education;Neuromuscular re-education;Manual techniques;Passive range of motion;Energy conservation   PT Next Visit Plan Review exercises hip standing exercises, modalities for pain, hip strength and ROM, trunk flexion exercises   PT Home Exercise Plan review hip abd, extension and flexion   Consulted and Agree with Plan of Care Patient      Patient will benefit from skilled therapeutic intervention in order to improve the following deficits and impairments:  Pain, Difficulty walking, Decreased strength, Decreased range of motion, Decreased endurance, Decreased activity  tolerance, Decreased mobility, Increased muscle spasms  Visit Diagnosis: Muscle weakness (generalized)  Pain in right hip  Bilateral low back pain without sciatica  Pain in left hip     Problem List Patient Active Problem List   Diagnosis Date Noted  . Microcytic anemia 05/15/2015  . Trochanteric bursitis of both  hips 05/11/2015  . Rash and nonspecific skin eruption 05/05/2015  . Constipation 09/15/2014  . Lumbar post-laminectomy syndrome 07/03/2014  . Altered awareness, transient 04/02/2014  . Seizure-like activity ([place]) 03/05/2014  . Spondylosis of lumbar region without myelopathy or radiculopathy 10/15/2013  . Healthcare maintenance 10/01/2013  . Special screening for malignant neoplasms, colon 03/13/2013  . Tobacco abuse 04/25/2011  . Skin lesion of left leg 04/21/2011  . Venous insufficiency 03/11/2010  . Diabetic neuropathy, painful ([place]) 03/10/2010  . BIPOLAR DISORDER UNSPECIFIED 06/05/2009  . BACK PAIN, CHRONIC 06/05/2009  . Obesity, morbid, BMI 50 or higher ([place]) 04/21/2008  . ANXIETY 04/21/2008  . Essential hypertension 04/21/2008  . Diabetes mellitus, type II, insulin dependent ([place]) 04/17/2008  . Hyperlipidemia 04/17/2008  . Irritable bowel syndrome 04/17/2008  . COLONIC POLYPS, HYPERPLASTIC, HX OF 04/17/2008    NAUMANN-HOUEGNIFIO,Christine Bradshaw PTA 05/28/2015, 11:02 AM  [facility] [address] [place], [place], [address] Phone: [phone]   Fax:  [phone]  Name: Christine Bradshaw MRN: [id number] Date of Birth: 1965-04-19

## 2015-06-02 ENCOUNTER — Ambulatory Visit: Payer: BLUE CROSS/BLUE SHIELD | Admitting: Physical Therapy

## 2015-06-02 ENCOUNTER — Encounter: Payer: Self-pay | Admitting: Physical Therapy

## 2015-06-02 DIAGNOSIS — M25551 Pain in right hip: Secondary | ICD-10-CM

## 2015-06-02 DIAGNOSIS — M6281 Muscle weakness (generalized): Secondary | ICD-10-CM | POA: Diagnosis not present

## 2015-06-02 DIAGNOSIS — M545 Low back pain, unspecified: Secondary | ICD-10-CM

## 2015-06-02 DIAGNOSIS — M25552 Pain in left hip: Secondary | ICD-10-CM

## 2015-06-02 NOTE — Therapy (Signed)
[facility] [address], [place], [place], [address] Phone: [phone]   Fax:  [phone]  Physical Therapy Treatment  Patient Details  Name: Christine Bradshaw MRN: [id number] Date of Birth: [birth year] Referring Provider: Dr. Alysia Penna  Encounter Date: 06/02/2015      PT End of Session - 06/02/15 1230    Visit Number 5   Date for PT Re-Evaluation 07/09/15   PT Start Time [id number]   PT Stop Time 1229   PT Time Calculation (min) 44 min   Activity Tolerance Patient tolerated treatment well   Behavior During Therapy [facility] for tasks assessed/performed      Past Medical History  Diagnosis Date  . Hypertension   . Depression   . Irritable bowel syndrome   . Diabetes mellitus type II   . Panic attack as reaction to stress   . Insomnia     03-08-13"states is in control"  . Tremor   . Memory loss of unknown cause   . Back pain, chronic   . HX OF GALLSTONE 04/21/2008    Qualifier: Diagnosis of  By: Julaine Hua CMA ([place]), Estill Bamberg    . HEADACHE, CHRONIC 04/21/2008  . Complication of anesthesia     manic episode  . GERD (gastroesophageal reflux disease)   . H/O hiatal hernia   . Arthritis     Past Surgical History  Procedure Laterality Date  . Tonsillectomy    . Cholecystectomy    . Appendectomy    . Esophagogastroduodenoscopy  2010    showed erosions  . Colonoscopy w/ biopsies  2010    adenomatous polyp repeat 2015  . Lumbar fusion  2011    L4-L5-S1 Dr. Marcial Pacas  . Abdominal hysterectomy    . Colonoscopy with propofol N/A 03/13/2013    Procedure: COLONOSCOPY WITH PROPOFOL;  Surgeon: Lafayette Dragon, MD;  Location: [facility];  Service: Endoscopy;  Laterality: N/A;    There were no vitals filed for this visit.      Subjective Assessment - 06/02/15 1216    Subjective Pt continues to rated her pain as 7/10 in low back, stated this weekend was a little better rated as 6/10. All over pt is very limited with her  endurancew due to low HB  level of 7, she has an appointment on June 8th with specilist for further diagnostic.     Patient Stated Goals reduce pain and improve mobility   Currently in Pain? Yes   Pain Score 7    Pain Location Back   Pain Orientation Lower   Pain Descriptors / Indicators Constant   Pain Type Chronic pain   Pain Onset More than a month ago   Pain Frequency Constant   Aggravating Factors  stand 2 min, walking is painful   Multiple Pain Sites No                         [facility] Adult PT Treatment/Exercise - 06/02/15 0001    Posture/Postural Control   Posture/Postural Control Postural limitations   Postural Limitations Rounded Shoulders;Forward head;Flexed trunk   Exercises   Exercises Lumbar;Knee/Hip;Shoulder   Lumbar Exercises: Stretches   Active Hamstring Stretch 3 reps;20 seconds  in sitting   Lumbar Exercises: Seated   Long Arc Quad on Chair Strengthening;Both;20 reps  1.5# added, pt tolerated incr. in wieght well   Other Seated Lumbar Exercises Charlie brown and rotation Lt/Rt x 10 with yellow plyoball 2#  Knee/Hip Exercises: Standing   Hip Flexion Stengthening;Both;20 reps  1.5# added   Hip ADduction Both;2 sets;10 reps  1.53 added   Hip Extension Stengthening;Both;2 sets;10 reps  1.5# added   Knee/Hip Exercises: Seated   Ball Squeeze 2 x 10 with 5 sec hold   Sit to Sand 2 sets;5 reps;Other (comment)  using 2# yellow plyoball and initially slight A   Shoulder Exercises: Seated   Other Seated Exercises 3 way raises x10 each direction, pt tolerated well  into flexion used 1# weight   Shoulder Exercises: Pulleys   Flexion 2 minutes  x 2 pt tolerated well                  PT Short Term Goals - 06/02/15 1234    PT SHORT TERM GOAL #1   Title independent with initial HEP for stretches   Time 4   Period Weeks   Status On-going   PT SHORT TERM GOAL #2   Title understand correct body mechanics with home tasks and bed mobility  using abdominal bracing   Time 4   Period Weeks   Status On-going   PT SHORT TERM GOAL #3   Title understand ways to manage pain with daily tasks   Time 4   Period Weeks   Status Achieved           PT Long Term Goals - 05/14/15 1246    PT LONG TERM GOAL #1   Title independent with HEP   Time 8   Period Weeks   Status New   PT LONG TERM GOAL #2   Title be able to walk in a store for 20 minutes with walker and not be in a buggy due to increase strength of bilateral legs and moderate pain   Time 8   Period Weeks   Status New   PT LONG TERM GOAL #3   Title clean home including wash dishes with minimal difficulty   Time 8   Period Weeks   Status New   PT LONG TERM GOAL #4   Title walk for 500 feet in yard with a walker due to improve bilateral lower extremity strength and increased endurance   Time 8   Period Weeks   Status New   PT LONG TERM GOAL #5   Title FOTO score </= 55% limitation   Time 8   Period Weeks   Status New               Plan - 06/02/15 1230    Clinical Impression Statement Pt with slighly increase of activity toleranc, standing tolerance is still very limited and she needs sitting restbreaks with standing activities. Pt will continue to benefit from skillet PT to improve activity tolerance, strength and endurance   Rehab Potential Good   Clinical Impairments Affecting Rehab Potential 2011 lumbar fusion, chronic pain. recieving injections every 6 month's, as of now didn't had shot for 73month now due to change in insurance. Needs to do PT first before recieving injections.    PT Frequency 2x / week   PT Duration 8 weeks   PT Treatment/Interventions ADLs/Self Care Home Management;Cryotherapy;Electrical Stimulation;Ultrasound;Moist Heat;Functional mobility training;Therapeutic activities;Therapeutic exercise;Balance training;Patient/family education;Neuromuscular re-education;Manual techniques;Passive range of motion;Energy conservation   PT Next  Visit Plan Continue with standing exercises to improve hip and LE strength, may add cone stacking, modalities for pain, trunk flexion exercises   PT Home Exercise Plan review hip abd, extension and flexion   Consulted and  Agree with Plan of Care Patient      Patient will benefit from skilled therapeutic intervention in order to improve the following deficits and impairments:  Pain, Difficulty walking, Decreased strength, Decreased range of motion, Decreased endurance, Decreased activity tolerance, Decreased mobility, Increased muscle spasms  Visit Diagnosis: Muscle weakness (generalized)  Pain in right hip  Bilateral low back pain without sciatica  Pain in left hip     Problem List Patient Active Problem List   Diagnosis Date Noted  . Microcytic anemia 05/15/2015  . Trochanteric bursitis of both hips 05/11/2015  . Rash and nonspecific skin eruption 05/05/2015  . Constipation 09/15/2014  . Lumbar post-laminectomy syndrome 07/03/2014  . Altered awareness, transient 04/02/2014  . Seizure-like activity ([place]) 03/05/2014  . Spondylosis of lumbar region without myelopathy or radiculopathy 10/15/2013  . Healthcare maintenance 10/01/2013  . Special screening for malignant neoplasms, colon 03/13/2013  . Tobacco abuse 04/25/2011  . Skin lesion of left leg 04/21/2011  . Venous insufficiency 03/11/2010  . Diabetic neuropathy, painful ([place]) 03/10/2010  . BIPOLAR DISORDER UNSPECIFIED 06/05/2009  . BACK PAIN, CHRONIC 06/05/2009  . Obesity, morbid, BMI 50 or higher ([place]) 04/21/2008  . ANXIETY 04/21/2008  . Essential hypertension 04/21/2008  . Diabetes mellitus, type II, insulin dependent ([place]) 04/17/2008  . Hyperlipidemia 04/17/2008  . Irritable bowel syndrome 04/17/2008  . COLONIC POLYPS, HYPERPLASTIC, HX OF 04/17/2008    NAUMANN-HOUEGNIFIO,Lendora Keys PTA 06/02/2015, 12:37 PM  [facility] [address], [place], [place],  [address] Phone: [phone]   Fax:  [phone]  Name: TOMOE ENGEL MRN: [id number] Date of Birth: 24-Dec-1965

## 2015-06-04 ENCOUNTER — Encounter: Payer: Self-pay | Admitting: Physical Therapy

## 2015-06-04 ENCOUNTER — Ambulatory Visit: Payer: BLUE CROSS/BLUE SHIELD | Attending: Physical Medicine & Rehabilitation | Admitting: Physical Therapy

## 2015-06-04 DIAGNOSIS — M6281 Muscle weakness (generalized): Secondary | ICD-10-CM | POA: Diagnosis present

## 2015-06-04 DIAGNOSIS — M545 Low back pain, unspecified: Secondary | ICD-10-CM

## 2015-06-04 DIAGNOSIS — M25552 Pain in left hip: Secondary | ICD-10-CM | POA: Diagnosis present

## 2015-06-04 DIAGNOSIS — M25551 Pain in right hip: Secondary | ICD-10-CM | POA: Diagnosis present

## 2015-06-04 NOTE — Therapy (Signed)
[facility] [address], [address] [place], [place], [address] Phone: [phone]   Fax:  [phone]  Physical Therapy Treatment  Patient Details  Name: Christine Bradshaw MRN: [id number] Date of Birth: 02/28/1965 Referring Provider: Dr. Claudette LawsAndrew Kirsteins  Encounter Date: 06/04/2015      PT End of Session - 06/04/15 1208    Visit Number 6   Date for PT Re-Evaluation 07/09/15   PT Start Time 1145   PT Stop Time 1230   PT Time Calculation (min) 45 min   Activity Tolerance Patient tolerated treatment well   Behavior During Therapy [facility] for tasks assessed/performed      Past Medical History  Diagnosis Date  . Hypertension   . Depression   . Irritable bowel syndrome   . Diabetes mellitus type II   . Panic attack as reaction to stress   . Insomnia     03-08-13"states is in control"  . Tremor   . Memory loss of unknown cause   . Back pain, chronic   . HX OF GALLSTONE 04/21/2008    Qualifier: Diagnosis of  By: Misty StanleyLewellyn CMA (AAMA), Marchelle FolksAmanda    . HEADACHE, CHRONIC 04/21/2008  . Complication of anesthesia     manic episode  . GERD (gastroesophageal reflux disease)   . H/O hiatal hernia   . Arthritis     Past Surgical History  Procedure Laterality Date  . Tonsillectomy    . Cholecystectomy    . Appendectomy    . Esophagogastroduodenoscopy  2010    showed erosions  . Colonoscopy w/ biopsies  2010    adenomatous polyp repeat 2015  . Lumbar fusion  2011    L4-L5-S1 Dr. Alveda [place]  . Abdominal hysterectomy    . Colonoscopy with propofol N/A 03/13/2013    Procedure: COLONOSCOPY WITH PROPOFOL;  Surgeon: Hart Carwinora M Brodie, MD;  Location: [facility];  Service: Endoscopy;  Laterality: N/A;    There were no vitals filed for this visit.      Subjective Assessment - 06/04/15 1204    Subjective I did a lot yesterday, my pain is 6/10 in low back. Pt stated she feels sitting more upright. Pt has MD appointment on June 8th for further  diagnostic due to low HB level of 7.   Patient Stated Goals reduce pain and improve mobility   Currently in Pain? Yes   Pain Score 6    Pain Location Back   Pain Orientation Lower   Pain Descriptors / Indicators Constant   Pain Type Chronic pain   Pain Onset More than a month ago   Pain Frequency Constant   Aggravating Factors  stand 2  min, walking is painful   Pain Relieving Factors muscle relaxer   Multiple Pain Sites No                         [facility] Adult PT Treatment/Exercise - 06/04/15 0001    Ambulation/Gait   Ambulation/Gait Yes   Assistive device 4-wheeled walker   Gait Pattern Step-through pattern  improved trunk extension   Stairs Yes   Stairs Assistance 6: Modified independent (Device/Increase time);4: Min assist   Stair Management Technique Step to pattern   Posture/Postural Control   Posture/Postural Control Postural limitations   Postural Limitations Rounded Shoulders;Forward head;Flexed trunk   Exercises   Exercises Lumbar;Knee/Hip;Shoulder   Lumbar Exercises: Standing   Other Standing Lumbar Exercises Cone stacking x 1 min x 2 with seating  restbreak b/w   Lumbar Exercises: Seated   Long Arc Quad on Chair Strengthening;Both;20 reps  2.5   Other Seated Lumbar Exercises Charlie brown and rotation Lt/Rt x 10 with yellow plyoball 2#   Knee/Hip Exercises: Standing   Forward Step Up Both;2 sets;10 reps;Hand Hold: 2;Step Height: 6"   Knee/Hip Exercises: Seated   Sit to Sand 2 sets;5 reps;Other (comment)   Shoulder Exercises: Seated   Other Seated Exercises 3 way raises x10 each direction, pt tolerated well  13 added   Shoulder Exercises: Pulleys   Flexion 2 minutes                  PT Short Term Goals - 06/02/15 1234    PT SHORT TERM GOAL #1   Title independent with initial HEP for stretches   Time 4   Period Weeks   Status On-going   PT SHORT TERM GOAL #2   Title understand correct body mechanics with home tasks and bed  mobility using abdominal bracing   Time 4   Period Weeks   Status On-going   PT SHORT TERM GOAL #3   Title understand ways to manage pain with daily tasks   Time 4   Period Weeks   Status Achieved           PT Long Term Goals - 05/14/15 1246    PT LONG TERM GOAL #1   Title independent with HEP   Time 8   Period Weeks   Status New   PT LONG TERM GOAL #2   Title be able to walk in a store for 20 minutes with walker and not be in a buggy due to increase strength of bilateral legs and moderate pain   Time 8   Period Weeks   Status New   PT LONG TERM GOAL #3   Title clean home including wash dishes with minimal difficulty   Time 8   Period Weeks   Status New   PT LONG TERM GOAL #4   Title walk for 500 feet in yard with a walker due to improve bilateral lower extremity strength and increased endurance   Time 8   Period Weeks   Status New   PT LONG TERM GOAL #5   Title FOTO score </= 55% limitation   Time 8   Period Weeks   Status New               Plan - 06/04/15 1211    Clinical Impression Statement Pt continues to improve with activity tolerance, today she was able to practice stepping up on stairs, left LE more difficult due to incr neuropathy. Pt will continue to benefit from skilled PT to improve activity tolerance and endurance.    Rehab Potential Good   Clinical Impairments Affecting Rehab Potential 2011 lumbar fusion, chronic pain. recieving injections every 6 month's, as of now didn't had shot for 56month now due to change in insurance. Needs to do PT first before recieving injections.    PT Frequency 2x / week   PT Duration 8 weeks   PT Treatment/Interventions ADLs/Self Care Home Management;Cryotherapy;Electrical Stimulation;Ultrasound;Moist Heat;Functional mobility training;Therapeutic activities;Therapeutic exercise;Balance training;Patient/family education;Neuromuscular re-education;Manual techniques;Passive range of motion;Energy conservation   PT Next  Visit Plan Continue with standing exercises to improve hip and LE strength, modalities for pain, trunk flexion exercises   PT Home Exercise Plan review hip abd, extension and flexion   Consulted and Agree with Plan of Care Patient  Patient will benefit from skilled therapeutic intervention in order to improve the following deficits and impairments:  Pain, Difficulty walking, Decreased strength, Decreased range of motion, Decreased endurance, Decreased activity tolerance, Decreased mobility, Increased muscle spasms  Visit Diagnosis: Muscle weakness (generalized)  Pain in right hip  Bilateral low back pain without sciatica  Pain in left hip     Problem List Patient Active Problem List   Diagnosis Date Noted  . Microcytic anemia 05/15/2015  . Trochanteric bursitis of both hips 05/11/2015  . Rash and nonspecific skin eruption 05/05/2015  . Constipation 09/15/2014  . Lumbar post-laminectomy syndrome 07/03/2014  . Altered awareness, transient 04/02/2014  . Seizure-like activity ([place]) 03/05/2014  . Spondylosis of lumbar region without myelopathy or radiculopathy 10/15/2013  . Healthcare maintenance 10/01/2013  . Special screening for malignant neoplasms, colon 03/13/2013  . Tobacco abuse 04/25/2011  . Skin lesion of left leg 04/21/2011  . Venous insufficiency 03/11/2010  . Diabetic neuropathy, painful ([place]) 03/10/2010  . BIPOLAR DISORDER UNSPECIFIED 06/05/2009  . BACK PAIN, CHRONIC 06/05/2009  . Obesity, morbid, BMI 50 or higher ([place]) 04/21/2008  . ANXIETY 04/21/2008  . Essential hypertension 04/21/2008  . Diabetes mellitus, type II, insulin dependent ([place]) 04/17/2008  . Hyperlipidemia 04/17/2008  . Irritable bowel syndrome 04/17/2008  . COLONIC POLYPS, HYPERPLASTIC, HX OF 04/17/2008    NAUMANN-HOUEGNIFIO,Sophia Bradshaw PTA 06/04/2015, 12:36 PM  [facility] [address], [place], [place], [address] Phone: [phone]    Fax:  [phone]  Name: Christine Bradshaw MRN: [id number] Date of Birth: 1965-08-09

## 2015-06-09 ENCOUNTER — Encounter: Payer: Self-pay | Admitting: Physical Therapy

## 2015-06-09 ENCOUNTER — Ambulatory Visit: Payer: BLUE CROSS/BLUE SHIELD | Admitting: Physical Therapy

## 2015-06-09 DIAGNOSIS — M545 Low back pain, unspecified: Secondary | ICD-10-CM

## 2015-06-09 DIAGNOSIS — M6281 Muscle weakness (generalized): Secondary | ICD-10-CM | POA: Diagnosis not present

## 2015-06-09 DIAGNOSIS — M25552 Pain in left hip: Secondary | ICD-10-CM

## 2015-06-09 DIAGNOSIS — M25551 Pain in right hip: Secondary | ICD-10-CM

## 2015-06-09 NOTE — Therapy (Addendum)
[facility] [address] [place], [place], [address] Phone: [phone]   Fax:  [phone]  Physical Therapy Treatment  Patient Details  Name: Christine Bradshaw MRN: [id number] Date of Birth: [birth year] Referring Provider: Dr. Alysia Penna  Encounter Date: 06/09/2015      PT End of Session - 06/09/15 1215    Visit Number 7   Date for PT Re-Evaluation 07/09/15   PT Start Time 1145   PT Stop Time 1230   PT Time Calculation (min) 45 min   Activity Tolerance Patient tolerated treatment well   Behavior During Therapy [facility] for tasks assessed/performed      Past Medical History  Diagnosis Date  . Hypertension   . Depression   . Irritable bowel syndrome   . Diabetes mellitus type II   . Panic attack as reaction to stress   . Insomnia     03-08-13"states is in control"  . Tremor   . Memory loss of unknown cause   . Back pain, chronic   . HX OF GALLSTONE 04/21/2008    Qualifier: Diagnosis of  By: Julaine Hua CMA ([place]), Estill Bamberg    . HEADACHE, CHRONIC 04/21/2008  . Complication of anesthesia     manic episode  . GERD (gastroesophageal reflux disease)   . H/O hiatal hernia   . Arthritis     Past Surgical History  Procedure Laterality Date  . Tonsillectomy    . Cholecystectomy    . Appendectomy    . Esophagogastroduodenoscopy  2010    showed erosions  . Colonoscopy w/ biopsies  2010    adenomatous polyp repeat 2015  . Lumbar fusion  2011    L4-L5-S1 Dr. Marcial Pacas  . Abdominal hysterectomy    . Colonoscopy with propofol N/A 03/13/2013    Procedure: COLONOSCOPY WITH PROPOFOL;  Surgeon: Lafayette Dragon, MD;  Location: [facility];  Service: Endoscopy;  Laterality: N/A;    There were no vitals filed for this visit.      Subjective Assessment - 06/09/15 1204    Subjective Pt rates her pain in low back as 6/10. Pt reports she feels sitting un straighter and walking more upright. Pt has MD appointment on June 8th  for further diagnostic  due to low HB level of 7 per pt report   Patient Stated Goals reduce pain and improve mobility   Currently in Pain? Yes   Pain Score 6    Pain Location Back   Pain Orientation Lower   Pain Descriptors / Indicators Constant   Pain Type Chronic pain   Pain Onset More than a month ago   Pain Frequency Constant   Aggravating Factors  stand 2 min, walking is painful   Pain Relieving Factors muscle relaxer   Multiple Pain Sites No      G-code: Functional assessment tool used is FOTO score is 66% limitation CL, functional limitation is other PT primary, goal is CK, discharge is CL. Earlie Counts, PT 07/22/2015 10:51 AM                     [facility] Adult PT Treatment/Exercise - 06/09/15 0001    Ambulation/Gait   Ambulation/Gait Yes  2.5# added   Assistive device 4-wheeled walker   Gait Pattern Step-through pattern   Stairs Yes   Stairs Assistance 6: Modified independent (Device/Increase time);4: Min assist   Stair Management Technique Step to pattern   Posture/Postural Control   Posture/Postural Control Postural limitations  Postural Limitations Rounded Shoulders;Forward head;Flexed trunk   Exercises   Exercises Lumbar;Knee/Hip;Shoulder   Lumbar Exercises: Standing   Other Standing Lumbar Exercises Cone stacking x 1 min x 2 with seating restbreak b/w   Lumbar Exercises: Seated   Long Arc Quad on Chair Strengthening;Both;20 reps  2.5# added, seated elevated on blue pillow   Other Seated Lumbar Exercises Charlie brown and chopping Lt/Rt x 10 with yellow plyoball 2#  attempted red 2000g ball,but that hurts shoulder   Knee/Hip Exercises: Standing   Forward Step Up Both;2 sets;10 reps;Hand Hold: 2;Step Height: 6"  2.5# added   Knee/Hip Exercises: Seated   Sit to Sand 2 sets;5 reps;Other (comment)   Shoulder Exercises: Seated   Other Seated Exercises 3 way raises x10 each direction, pt tolerated well  1# added   Shoulder Exercises: Pulleys    Flexion 2 minutes  x 2                  PT Short Term Goals - 06/09/15 1223    PT SHORT TERM GOAL #1   Title independent with initial HEP for stretches   Time 4   Period Weeks   Status Achieved   PT SHORT TERM GOAL #2   Title understand correct body mechanics with home tasks and bed mobility using abdominal bracing   Time 4   Period Weeks   Status On-going   PT SHORT TERM GOAL #3   Title understand ways to manage pain with daily tasks   Time 4   Period Weeks   Status Achieved           PT Long Term Goals - 05/14/15 1246    PT LONG TERM GOAL #1   Title independent with HEP   Time 8   Period Weeks   Status New   PT LONG TERM GOAL #2   Title be able to walk in a store for 20 minutes with walker and not be in a buggy due to increase strength of bilateral legs and moderate pain   Time 8   Period Weeks   Status New   PT LONG TERM GOAL #3   Title clean home including wash dishes with minimal difficulty   Time 8   Period Weeks   Status New   PT LONG TERM GOAL #4   Title walk for 500 feet in yard with a walker due to improve bilateral lower extremity strength and increased endurance   Time 8   Period Weeks   Status New   PT LONG TERM GOAL #5   Title FOTO score </= 55% limitation   Time 8   Period Weeks   Status New               Plan - 06/09/15 1216    Clinical Impression Statement Pt continues to improve with activity tolerance, pt was able to perform a second round with stepping up on 6" stair. Left LE more difficult due to incr neuropathy. Pt was able to tolerate 27mn of activities without sitting restbreak. Pt will continue to benefit from skilled PT to improve activity tolerance and endurance.   Rehab Potential Good   Clinical Impairments Affecting Rehab Potential 2011 lumbar fusion, chronic pain. recieving injections every 6 month's, as of now didn't had shot for 938monthow due to change in insurance. Needs to do PT first before recieving  injections.    PT Frequency 2x / week   PT Duration 8 weeks  PT Treatment/Interventions ADLs/Self Care Home Management;Cryotherapy;Electrical Stimulation;Ultrasound;Moist Heat;Functional mobility training;Therapeutic activities;Therapeutic exercise;Balance training;Patient/family education;Neuromuscular re-education;Manual techniques;Passive range of motion;Energy conservation   PT Next Visit Plan Continue to improve standing tolerance and bil LE strength, core and overall strength and built up stamina   PT Home Exercise Plan review hip abd, extension and flexion   Consulted and Agree with Plan of Care Patient      Patient will benefit from skilled therapeutic intervention in order to improve the following deficits and impairments:  Pain, Difficulty walking, Decreased strength, Decreased range of motion, Decreased endurance, Decreased activity tolerance, Decreased mobility, Increased muscle spasms  Visit Diagnosis: Muscle weakness (generalized)  Pain in right hip  Bilateral low back pain without sciatica  Pain in left hip     Problem List Patient Active Problem List   Diagnosis Date Noted  . Microcytic anemia 05/15/2015  . Trochanteric bursitis of both hips 05/11/2015  . Rash and nonspecific skin eruption 05/05/2015  . Constipation 09/15/2014  . Lumbar post-laminectomy syndrome 07/03/2014  . Altered awareness, transient 04/02/2014  . Seizure-like activity ([place]) 03/05/2014  . Spondylosis of lumbar region without myelopathy or radiculopathy 10/15/2013  . Healthcare maintenance 10/01/2013  . Special screening for malignant neoplasms, colon 03/13/2013  . Tobacco abuse 04/25/2011  . Skin lesion of left leg 04/21/2011  . Venous insufficiency 03/11/2010  . Diabetic neuropathy, painful ([place]) 03/10/2010  . BIPOLAR DISORDER UNSPECIFIED 06/05/2009  . BACK PAIN, CHRONIC 06/05/2009  . Obesity, morbid, BMI 50 or higher ([place]) 04/21/2008  . ANXIETY 04/21/2008  . Essential hypertension  04/21/2008  . Diabetes mellitus, type II, insulin dependent ([place]) 04/17/2008  . Hyperlipidemia 04/17/2008  . Irritable bowel syndrome 04/17/2008  . COLONIC POLYPS, HYPERPLASTIC, HX OF 04/17/2008    NAUMANN-HOUEGNIFIO,Raykwon Hobbs PTA 06/09/2015, 12:29 PM  [facility] [address], [place], [place], [address] Phone: [phone]   Fax:  [phone]  Name: Christine Bradshaw MRN: [id number] Date of Birth: 1965/02/04  PHYSICAL THERAPY DISCHARGE SUMMARY  Visits from Start of Care: 7  Current functional level related to goals / functional outcomes: See above. Patient did not return after last visit on 06/09/2015.   Remaining deficits: See above.    Education / Equipment: HEP Plan:                                                    Patient goals were not met. Patient is being discharged due to not returning since the last visit.  Thank you for the referral. Earlie Counts, PT 07/22/2015 10:53 AM  ?????

## 2015-06-11 ENCOUNTER — Ambulatory Visit: Payer: BLUE CROSS/BLUE SHIELD | Admitting: Physical Therapy

## 2015-06-11 ENCOUNTER — Ambulatory Visit (INDEPENDENT_AMBULATORY_CARE_PROVIDER_SITE_OTHER): Payer: BLUE CROSS/BLUE SHIELD | Admitting: Gastroenterology

## 2015-06-11 ENCOUNTER — Encounter: Payer: Self-pay | Admitting: Gastroenterology

## 2015-06-11 VITALS — BP 122/80 | HR 80 | Ht 64.0 in | Wt 250.4 lb

## 2015-06-11 DIAGNOSIS — Z794 Long term (current) use of insulin: Secondary | ICD-10-CM

## 2015-06-11 DIAGNOSIS — D509 Iron deficiency anemia, unspecified: Secondary | ICD-10-CM

## 2015-06-11 DIAGNOSIS — E119 Type 2 diabetes mellitus without complications: Secondary | ICD-10-CM

## 2015-06-11 DIAGNOSIS — K5900 Constipation, unspecified: Secondary | ICD-10-CM | POA: Diagnosis not present

## 2015-06-11 NOTE — Patient Instructions (Signed)
If you are age 52 or older, your body mass index should be between 23-30. Your Body mass index is 42.96 kg/(m^2). If this is out of the aforementioned range listed, please consider follow up with your Primary Care Provider.  If you are age 38 or younger, your body mass index should be between 19-25. Your Body mass index is 42.96 kg/(m^2). If this is out of the aformentioned range listed, please consider follow up with your Primary Care Provider.   You have been scheduled for an endoscopy. Please follow written instructions given to you at your visit today. If you use inhalers (even only as needed), please bring them with you on the day of your procedure. Your physician has requested that you go to www.startemmi.com and enter the access code given to you at your visit today. This web site gives a general overview about your procedure. However, you should still follow specific instructions given to you by our office regarding your preparation for the procedure.  Thank you for choosing [facility]  Dr Wilfrid Lund III

## 2015-06-11 NOTE — Progress Notes (Signed)
[facility] Consult Note:  History: Christine Bradshaw 06/11/2015  Referring physician: Dimas Chyle, MD  Reason for consult/chief complaint: Anemia   Subjective HPI:   Saw PCP Christine Bradshaw) last month.  They said:" Patient seen in clinic last week, had routine screening labs and found to have a Hgb of 9.0. Last year had Hgb 15.3. Appears to be in the 11-15 range over the past 4-5 years.  Had colonoscopy 2 years ago - found to have moderate diverticulosis. Per chart review patient was found to have small erosions in EGD in 2010. Denies NSAID use. No dyspepsia or epigastric Bradshaw. [name] is s/p hysterectomy. No hematuria. No melena or hematochezia. "  Christine Bradshaw was feeling fatigued, and routine labs showed anemia with a ferritin of 5. She denies frank rectal bleeding or melena. Does have turned dark since starting the iron. She denies pyrosis, regurgitation, nausea, vomiting, early satiety, vomiting or unintended weight loss. She purposely lost over 90 pounds within the last year through diet. Christine Bradshaw had a routine colonoscopy with Dr. Olevia Perches in March 2015, no polyps were found.  ROS:  Review of Systems  Constitutional: Negative for appetite change and unexpected weight change.  HENT: Negative for mouth sores and voice change.   Eyes: Negative for Bradshaw and redness.  Respiratory: Negative for cough and shortness of breath.   Cardiovascular: Negative for chest Bradshaw and palpitations.  Genitourinary: Negative for dysuria and hematuria.  Musculoskeletal: Positive for back Bradshaw and arthralgias. Negative for myalgias.  Skin: Negative for pallor and rash.  Neurological: Negative for weakness and headaches.  Hematological: Negative for adenopathy.  Psychiatric/Behavioral: Positive for dysphoric mood.     Past Medical History: Past Medical History  Diagnosis Date  . Hypertension   . Depression   . Irritable bowel syndrome   . Diabetes mellitus type II   . Panic attack as  reaction to stress   . Insomnia     03-08-13"states is in control"  . Tremor   . Memory loss of unknown cause   . Back Bradshaw, chronic   . HX OF GALLSTONE 04/21/2008    Qualifier: Diagnosis of  By: Julaine Hua CMA ([place]), Estill Bamberg    . HEADACHE, CHRONIC 04/21/2008  . Complication of anesthesia     manic episode  . GERD (gastroesophageal reflux disease)   . H/O hiatal hernia   . Arthritis   . Anemia      Past Surgical History: Past Surgical History  Procedure Laterality Date  . Tonsillectomy    . Cholecystectomy    . Appendectomy    . Esophagogastroduodenoscopy  2010    showed erosions  . Colonoscopy w/ biopsies  2010    adenomatous polyp repeat 2015  . Lumbar fusion  2011    L4-L5-S1 Dr. Marcial Pacas  . Abdominal hysterectomy    . Colonoscopy with propofol N/A 03/13/2013    Procedure: COLONOSCOPY WITH PROPOFOL;  Surgeon: Lafayette Dragon, MD;  Location: [facility];  Service: Endoscopy;  Laterality: N/A;     Family History: Family History  Problem Relation Age of Onset  . Hyperlipidemia Father   . Hypertension Father   . Heart disease Father   . Colon polyps Neg Hx     Social History: Social History   Social History  . Marital Status: Married    Spouse Name: N/A  . Number of Children: 2  . Years of Education: N/A   Occupational History  . disability    Social History Main Topics  . Smoking status:  Current Every Day Smoker -- 1.50 packs/day    Types: Cigarettes  . Smokeless tobacco: Never Used  . Alcohol Use: No  . Drug Use: No  . Sexual Activity:    Partners: Male   Other Topics Concern  . None   Social History Narrative   Husband on disability   Pt also on disability    Allergies: Allergies  Allergen Reactions  . Abilify [Aripiprazole]     Shortness of breath, cramps, shakes   . Lithium     Kidneys stop    Outpatient Meds: Current Outpatient Prescriptions  Medication Sig Dispense Refill  . aspirin EC 81 MG tablet Take 81 mg by mouth every morning.     . clonazePAM (KLONOPIN) 1 MG tablet Take 1 mg by mouth 3 (three) times daily.    . DULoxetine (CYMBALTA) 60 MG capsule Take 120 mg by mouth every morning.    Marland Kitchen esomeprazole (NEXIUM) 20 MG capsule Take 20 mg by mouth daily at 12 noon.    . fentaNYL (DURAGESIC - DOSED MCG/HR) 25 MCG/HR patch Place 1 patch (25 mcg total) onto the skin every 3 (three) days. 10 patch 0  . ferrous sulfate 325 (65 FE) MG tablet Take 1 tablet (325 mg total) by mouth 3 (three) times daily with meals. 90 tablet 3  . furosemide (LASIX) 40 MG tablet TAKE ONE TABLET BY MOUTH ONCE DAILY AS NEEDED FOR FLUID 90 tablet 3  . hydrocortisone cream 0.5 % Apply 1 application topically 2 (two) times daily. 30 g 0  . Insulin Glargine (LANTUS) 100 UNIT/ML Solostar Pen Inject 32 Units into the skin daily at 10 pm. 15 mL 11  . ketoconazole (NIZORAL) 2 % cream Apply 1 application topically daily. 15 g 0  . lamoTRIgine (LAMICTAL) 100 MG tablet Take 100 mg by mouth 2 (two) times daily.      . Magnesium Citrate 125 MG CAPS Take 400 mg by mouth 2 (two) times daily as needed.    . metFORMIN (GLUCOPHAGE) 1000 MG tablet Take 1 tablet (1,000 mg total) by mouth 2 (two) times daily with a meal. 180 tablet 3  . methocarbamol (ROBAXIN) 500 MG tablet Take 1 tablet (500 mg total) by mouth every 8 (eight) hours as needed. 90 tablet 2  . naloxegol oxalate (MOVANTIK) 25 MG TABS tablet Take 1 tablet (25 mg total) by mouth daily. 30 tablet 1  . NOVOLOG FLEXPEN 100 UNIT/ML FlexPen   1  . omega-3 acid ethyl esters (LOVAZA) 1 g capsule Take 2 capsules (2 g total) by mouth 2 (two) times daily. 360 capsule 3  . omeprazole (PRILOSEC) 40 MG capsule Take 1 capsule (40 mg total) by mouth daily. 90 capsule 3  . oxyCODONE-acetaminophen (PERCOCET) 7.5-325 MG tablet Take 1 tablet by mouth every 8 (eight) hours as needed. 90 tablet 0  . pravastatin (PRAVACHOL) 40 MG tablet Take 1 tablet (40 mg total) by mouth every evening. 90 tablet 3  . quinapril (ACCUPRIL) 20 MG tablet  Take 1 tablet (20 mg total) by mouth at bedtime. 90 tablet 3  . risperiDONE (RISPERDAL) 3 MG tablet Take 2 tablets by mouth every evening.    . traZODone (DESYREL) 100 MG tablet Take 100 mg by mouth at bedtime.       No current facility-administered medications for this visit.      ___________________________________________________________________ Objective  Exam:  BP 122/80 mmHg  Pulse 80  Ht 5\' 4"  (1.626 m)  Wt 250 lb 6.4 oz (113.581 kg)  BMI 42.96 kg/m2   General: this is a(n) Morbidly obese woman with a restricted affect   Eyes: sclera anicteric, no redness  ENT: oral mucosa moist without lesions, no cervical or supraclavicular lymphadenopathy, good dentition  CV: RRR without murmur, S1/S2, no JVD,+ peripheral edema  Resp: clear to auscultation bilaterally, normal RR and effort noted  GI: soft, no tenderness, with active bowel sounds. No guarding or palpable organomegaly noted. Exam somewhat limited by body habitus  Skin; warm and dry, no rash or jaundice noted  Neuro: awake, alert and oriented x 3. Normal gross motor function and fluent speech  Labs:  CBC    Component Value Date/Time   WBC 14.0* 05/15/2015 1507   RBC 4.41 05/15/2015 1549   RBC 4.39 05/15/2015 1507   HGB 8.9* 05/15/2015 1507   HGB 9.2* 05/15/2015 1500   HCT 29.9* 05/15/2015 1507   PLT 438* 05/15/2015 1507   MCV 68.1* 05/15/2015 1507   MCH 20.3* 05/15/2015 1507   MCHC 29.8* 05/15/2015 1507   RDW 17.6* 05/15/2015 1507   LYMPHSABS 2.5 02/10/2010 1322   MONOABS 0.5 02/10/2010 1322   EOSABS 0.2 02/10/2010 1322   BASOSABS 0.1 02/10/2010 1322    Similar Hemoglobin 10 days prior  Ferritin 5 CMP Latest Ref Rng 05/05/2015 02/07/2014 12/09/2013  Glucose 65 - 99 mg/dL 68 87 119(H)  BUN 7 - 25 mg/dL 12 13 12   Creatinine 0.50 - 1.05 mg/dL 0.78 0.90 0.76  Sodium 135 - 146 mmol/L 139 141 137  Potassium 3.5 - 5.3 mmol/L 4.3 4.3 4.2  Chloride 98 - 110 mmol/L 103 101 103  CO2 20 - 31 mmol/L 25 - 26   Calcium 8.6 - 10.4 mg/dL 9.6 - 9.4  Total Protein 6.1 - 8.1 g/dL 6.5 - -  Total Bilirubin 0.2 - 1.2 mg/dL 0.3 - -  Alkaline Phos 33 - 130 U/L 65 - -  AST 10 - 35 U/L 24 - -  ALT 6 - 29 U/L 18 - -    Assessment: Encounter Diagnoses  Name Primary?  Marland Kitchen Anemia, iron deficiency Yes  . Constipation, unspecified constipation type   . Diabetes mellitus, type II, insulin dependent (HCC)   No localizing GI symptoms. She is on chronic aspirin, which raises the concern for peptic ulcer. It seems unlikely she would've developed neoplasia in the colon as a cause for blood loss in the last 2 years.   Plan:  Upper endoscopy.  The benefits and risks of the planned procedure were described in detail with the patient or (when appropriate) their health care proxy.  Risks were outlined as including, but not limited to, bleeding, infection, perforation, adverse medication reaction leading to cardiac or pulmonary decompensation, or pancreatitis (if ERCP).  The limitation of incomplete mucosal visualization was also discussed.  No guarantees or warranties were given.  If negative, we will need to pursue a repeat colonoscopy  Thank you for the courtesy of this consult.  Please call me with any questions or concerns.  Nelida Meuse III  CC: Dimas Chyle, MD

## 2015-06-12 ENCOUNTER — Encounter: Payer: Self-pay | Admitting: Gastroenterology

## 2015-06-16 ENCOUNTER — Encounter: Payer: Self-pay | Admitting: Family Medicine

## 2015-06-16 ENCOUNTER — Encounter: Payer: BLUE CROSS/BLUE SHIELD | Admitting: Physical Therapy

## 2015-06-16 ENCOUNTER — Ambulatory Visit (INDEPENDENT_AMBULATORY_CARE_PROVIDER_SITE_OTHER): Payer: BLUE CROSS/BLUE SHIELD | Admitting: Family Medicine

## 2015-06-16 VITALS — BP 118/74 | HR 100 | Temp 98.4°F | Ht 64.0 in | Wt 239.0 lb

## 2015-06-16 DIAGNOSIS — R569 Unspecified convulsions: Secondary | ICD-10-CM

## 2015-06-16 DIAGNOSIS — D509 Iron deficiency anemia, unspecified: Secondary | ICD-10-CM

## 2015-06-16 DIAGNOSIS — D649 Anemia, unspecified: Secondary | ICD-10-CM

## 2015-06-16 LAB — CBC
HCT: 43.5 % (ref 35.0–45.0)
Hemoglobin: 13.6 g/dL (ref 11.7–15.5)
MCH: 25.3 pg — ABNORMAL LOW (ref 27.0–33.0)
MCHC: 31.3 g/dL — ABNORMAL LOW (ref 32.0–36.0)
MCV: 80.9 fL (ref 80.0–100.0)
MPV: 9.9 fL (ref 7.5–12.5)
PLATELETS: 366 10*3/uL (ref 140–400)
RBC: 5.38 MIL/uL — AB (ref 3.80–5.10)
WBC: 10 10*3/uL (ref 3.8–10.8)

## 2015-06-16 NOTE — Patient Instructions (Signed)
We will recheck your blood counts today. Please make sure you follow up with GI.  Please give the neurologist a call about your dizzy spells. Their number is listed below.   [phone]  Please come back in 1-2 months.  Take care,  Dr Jerline Pain

## 2015-06-16 NOTE — Progress Notes (Signed)
    Subjective:  Christine Bradshaw is a 50 y.o. female who presents to the [facility] today with a chief complaint of anemia follow up.   HPI:  Microcytic Anemia Found on screening labs last month. Was started on iron supplements and sent to GI. Iron panel consistent with iron deficiency (Iron 17, TSAT 3, Ferritin 5). Patient seen by GI, will have EGD in 2 days. If unrevealing will have colonoscopy. Currently tolerating iron supplementations well without side effects.   Dizziness / Seizure like activity Chronic problem for patient for the past 20+ years. Has previously been seen by neurology but stopped going last year before they could complete their work up. Denies vertigo. Denies loss of consciousness. Says that she can feel the episodes coming, then she goes "limp" and is unable to hold herself up. No headaches or vision changes. No focal weakness or numbness.   ROS: Per HPI  Objective:  Physical Exam: BP 118/74 mmHg  Pulse 100  Temp(Src) 98.4 F (36.9 C) (Oral)  Ht 5\' 4"  (1.626 m)  Wt 239 lb (108.41 kg)  BMI 41.00 kg/m2  Gen: NAD, resting comfortably CV: RRR with no murmurs appreciated Pulm: NWOB, CTAB with no crackles, wheezes, or rhonchi GI: Normal bowel sounds present. Soft, Nontender, Nondistended. MSK: no edema, cyanosis, or clubbing noted Skin: warm, dry Neuro: PERRL, EOMI, CN2-12 intact. No focal weakness. Sensation intact throughout.  Psych: Normal affect and thought content  Assessment/Plan:  Microcytic anemia IN the process of having GI work up. Continue iron supplementation. Will check CBC today. Follow up in 1-2 months, will need iron studies at that time. Can stop or decrease oral iron supplementation once source of bleed as been identified and iron stores are repleted.   Seizure-like activity Neuro exam normal today. No red flag signs or symptoms. Instructed patient to follow up with neurology to complete work up.     Algis Greenhouse. Jerline Pain, [place]  Medicine Resident PGY-2 06/16/2015 3:08 PM

## 2015-06-16 NOTE — Assessment & Plan Note (Signed)
Neuro exam normal today. No red flag signs or symptoms. Instructed patient to follow up with neurology to complete work up.

## 2015-06-16 NOTE — Assessment & Plan Note (Signed)
IN the process of having GI work up. Continue iron supplementation. Will check CBC today. Follow up in 1-2 months, will need iron studies at that time. Can stop or decrease oral iron supplementation once source of bleed as been identified and iron stores are repleted.

## 2015-06-17 ENCOUNTER — Encounter: Payer: Self-pay | Admitting: Family Medicine

## 2015-06-18 ENCOUNTER — Encounter: Payer: BLUE CROSS/BLUE SHIELD | Admitting: Physical Therapy

## 2015-06-18 ENCOUNTER — Ambulatory Visit (AMBULATORY_SURGERY_CENTER): Payer: BLUE CROSS/BLUE SHIELD | Admitting: Gastroenterology

## 2015-06-18 ENCOUNTER — Encounter: Payer: Self-pay | Admitting: Gastroenterology

## 2015-06-18 VITALS — BP 96/62 | HR 70 | Temp 98.0°F | Resp 23 | Ht 64.0 in | Wt 250.0 lb

## 2015-06-18 DIAGNOSIS — D509 Iron deficiency anemia, unspecified: Secondary | ICD-10-CM | POA: Diagnosis not present

## 2015-06-18 LAB — GLUCOSE, CAPILLARY
Glucose-Capillary: 92 mg/dL (ref 65–99)
Glucose-Capillary: 94 mg/dL (ref 65–99)

## 2015-06-18 MED ORDER — SODIUM CHLORIDE 0.9 % IV SOLN: 500.0000 mL | INTRAVENOUS | Status: DC

## 2015-06-18 NOTE — Progress Notes (Signed)
On admission pt said she had

## 2015-06-18 NOTE — Op Note (Signed)
[place] Patient Name: Christine Bradshaw Procedure Date: 06/18/2015 9:45 AM MRN: [id number] Endoscopist: Mallie Mussel L. Loletha Carrow , MD Age: 50 Referring MD:  Date of Birth: [birth year] Gender: Female Procedure:                Upper GI endoscopy Indications:              Iron deficiency anemia Medicines:                Monitored Anesthesia Care Procedure:                Pre-Anesthesia Assessment:                           - Prior to the procedure, a History and Physical                            was performed, and patient medications and                            allergies were reviewed. The patient's tolerance of                            previous anesthesia was also reviewed. The risks                            and benefits of the procedure and the sedation                            options and risks were discussed with the patient.                            All questions were answered, and informed consent                            was obtained. Prior Anticoagulants: The patient has                            taken aspirin, last dose was 1 day prior to                            procedure. ASA Grade Assessment: III - A patient                            with severe systemic disease. After reviewing the                            risks and benefits, the patient was deemed in                            satisfactory condition to undergo the procedure.                           After obtaining informed consent, the endoscope was  passed under direct vision. Throughout the                            procedure, the patient's blood pressure, pulse, and                            oxygen saturations were monitored continuously. The                            Model GIF-HQ190 [id number]) scope was introduced                            through the mouth, and advanced to the third part                            of duodenum. The upper GI endoscopy was                     accomplished without difficulty. The patient                            tolerated the procedure well. Scope In: Scope Out: Findings:                 The esophagus was normal.                           The stomach was normal.                           The cardia and gastric fundus were normal on                            retroflexion.                           The examined duodenum was normal. Complications:            No immediate complications. Estimated Blood Loss:     Estimated blood loss: none. Impression:               - Normal esophagus.                           - Normal stomach.                           - Normal examined duodenum.                           - No specimens collected. Recommendation:           - Patient has a contact number available for                            emergencies. The signs and symptoms of potential                            delayed complications were discussed with the  patient. Return to normal activities tomorrow.                            Written discharge instructions were provided to the                            patient.                           - Resume previous diet.                           - Continue present medications.                           - Perform a colonoscopy at the next available                            appointment. Annick Dimaio L. Loletha Carrow, MD 06/18/2015 10:24:58 AM This report has been signed electronically.

## 2015-06-18 NOTE — Progress Notes (Signed)
Patient awakening,vss,report to rn 

## 2015-06-18 NOTE — Progress Notes (Signed)
Pt says after back surgery 6 yrs ago hada manic episode for 5 days ,not sure if was from anesthesia . Pt was on Lithium at that time was reported to crna.[employer] [employer] crna Results for orders placed or performed in visit on 06/18/15  Glucose, capillary  Result Value Ref Range   Glucose-Capillary 92 65 - 99 mg/dL   was Results for orders placed or performed in visit on 06/18/15  Glucose, capillary  Result Value Ref Range   Glucose-Capillary 92 65 - 99 mg/dL

## 2015-06-18 NOTE — Patient Instructions (Signed)
Discharge instructions given. Schedule colonoscopy. Resume previous medication. Normal exam. YOU HAD AN ENDOSCOPIC PROCEDURE TODAY AT [facility]:   Refer to the procedure report that was given to you for any specific questions about what was found during the examination.  If the procedure report does not answer your questions, please call your gastroenterologist to clarify.  If you requested that your care partner not be given the details of your procedure findings, then the procedure report has been included in a sealed envelope for you to review at your convenience later.  YOU SHOULD EXPECT: Some feelings of bloating in the abdomen. Passage of more gas than usual.  Walking can help get rid of the air that was put into your GI tract during the procedure and reduce the bloating. If you had a lower endoscopy (such as a colonoscopy or flexible sigmoidoscopy) you may notice spotting of blood in your stool or on the toilet paper. If you underwent a bowel prep for your procedure, you may not have a normal bowel movement for a few days.  Please Note:  You might notice some irritation and congestion in your nose or some drainage.  This is from the oxygen used during your procedure.  There is no need for concern and it should clear up in a day or so.  SYMPTOMS TO REPORT IMMEDIATELY:    Following upper endoscopy (EGD)  Vomiting of blood or coffee ground material  New chest pain or pain under the shoulder blades  Painful or persistently difficult swallowing  New shortness of breath  Fever of 100F or higher  Black, tarry-looking stools  For urgent or emergent issues, a gastroenterologist can be reached at any hour by calling [phone].   DIET: Your first meal following the procedure should be a small meal and then it is ok to progress to your normal diet. Heavy or fried foods are harder to digest and may make you feel nauseous or bloated.  Likewise, meals heavy in dairy and  vegetables can increase bloating.  Drink plenty of fluids but you should avoid alcoholic beverages for 24 hours.  ACTIVITY:  You should plan to take it easy for the rest of today and you should NOT DRIVE or use heavy machinery until tomorrow (because of the sedation medicines used during the test).    FOLLOW UP: Our staff will call the number listed on your records the next business day following your procedure to check on you and address any questions or concerns that you may have regarding the information given to you following your procedure. If we do not reach you, we will leave a message.  However, if you are feeling well and you are not experiencing any problems, there is no need to return our call.  We will assume that you have returned to your regular daily activities without incident.  If any biopsies were taken you will be contacted by phone or by letter within the next 1-3 weeks.  Please call us at [phone] if you have not heard about the biopsies in 3 weeks.    SIGNATURES/CONFIDENTIALITY: You and/or your care partner have signed paperwork which will be entered into your electronic medical record.  These signatures attest to the fact that that the information above on your After Visit Summary has been reviewed and is understood.  Full responsibility of the confidentiality of this discharge information lies with you and/or your care-partner.

## 2015-06-19 ENCOUNTER — Telehealth: Payer: Self-pay

## 2015-06-19 NOTE — Telephone Encounter (Signed)
Attempt post procedure follow up call, no answer, left voice mail message.  

## 2015-06-22 ENCOUNTER — Ambulatory Visit: Payer: BLUE CROSS/BLUE SHIELD | Admitting: Physical Medicine & Rehabilitation

## 2015-06-25 ENCOUNTER — Ambulatory Visit (AMBULATORY_SURGERY_CENTER): Payer: Self-pay

## 2015-06-25 VITALS — Ht 64.0 in | Wt 243.0 lb

## 2015-06-25 DIAGNOSIS — D509 Iron deficiency anemia, unspecified: Secondary | ICD-10-CM

## 2015-06-25 MED ORDER — NA SULFATE-K SULFATE-MG SULF 17.5-3.13-1.6 GM/177ML PO SOLN: 1.0000 | Freq: Once | ORAL | Status: DC

## 2015-06-25 NOTE — Progress Notes (Signed)
No egg or soy allergy.  No previous complications from anesthesia. No home O2. No diet meds. 

## 2015-06-26 ENCOUNTER — Ambulatory Visit: Payer: BLUE CROSS/BLUE SHIELD | Admitting: Gastroenterology

## 2015-06-29 ENCOUNTER — Encounter: Payer: Self-pay | Admitting: Gastroenterology

## 2015-06-29 ENCOUNTER — Telehealth: Payer: Self-pay | Admitting: Gastroenterology

## 2015-06-29 NOTE — Telephone Encounter (Signed)
Left a message for pt informing her that I have sent a coupon to her pharmacy to help with the cost. Pt advised to call back with any questions or problems.

## 2015-06-30 ENCOUNTER — Encounter: Payer: Self-pay | Admitting: Physical Medicine & Rehabilitation

## 2015-06-30 ENCOUNTER — Encounter: Payer: BLUE CROSS/BLUE SHIELD | Attending: Physical Medicine & Rehabilitation

## 2015-06-30 ENCOUNTER — Ambulatory Visit (HOSPITAL_BASED_OUTPATIENT_CLINIC_OR_DEPARTMENT_OTHER): Payer: BLUE CROSS/BLUE SHIELD | Admitting: Physical Medicine & Rehabilitation

## 2015-06-30 DIAGNOSIS — M47816 Spondylosis without myelopathy or radiculopathy, lumbar region: Secondary | ICD-10-CM | POA: Diagnosis present

## 2015-06-30 DIAGNOSIS — M961 Postlaminectomy syndrome, not elsewhere classified: Secondary | ICD-10-CM | POA: Diagnosis not present

## 2015-06-30 MED ORDER — OXYCODONE-ACETAMINOPHEN 7.5-325 MG PO TABS: 1.0000 | ORAL_TABLET | Freq: Three times a day (TID) | ORAL | Status: DC | PRN

## 2015-06-30 MED ORDER — FENTANYL 12 MCG/HR TD PT72: 25.0000 ug | MEDICATED_PATCH | TRANSDERMAL | Status: DC

## 2015-06-30 NOTE — Patient Instructions (Addendum)
We will reduce Duragesic patch today If you do okay over the next month, may be able to [place] completely next month  If we get approval for the radiofrequency neurotomy, we can  call you to set up a separate appointment

## 2015-06-30 NOTE — Progress Notes (Signed)
Subjective:    Patient ID: Christine Bradshaw, female    DOB: 1965/04/30, 50 y.o.   MRN: [id number]  HPI Patient originally scheduled for radiofrequency procedure however has not received insurance approval. No other new problems. She does feel a little bit better after the physical therapy. She is completing her home exercise program on a every other day basis. Patient would like to try reducing her Duragesic from 25 g to 12 g Her goal is to get off the Duragesic.  She continues to take oxycodone 7.5 mg 3 times a day  Pain Inventory Average Pain 7 Pain Right Now 7 My pain is sharp and burning  In the last 24 hours, has pain interfered with the following? General activity 6 Relation with others 5 Enjoyment of life 7 What TIME of day is your pain at its worst? morning, daytime Sleep (in general) Good  Pain is worse with: walking, standing and some activites Pain improves with: rest, heat/ice, medication and injections Relief from Meds: 6  Mobility walk with assistance use a walker how many minutes can you walk? 5 ability to climb steps?  yes do you drive?  no use a wheelchair transfers alone  Function Do you have any goals in this area?  no  Neuro/Psych weakness spasms dizziness depression anxiety  Prior Studies Any changes since last visit?  no  Physicians involved in your care Any changes since last visit?  no   Family History  Problem Relation Age of Onset  . Hyperlipidemia Father   . Hypertension Father   . Heart disease Father   . Colon polyps Neg Hx   . Colon cancer Neg Hx    Social History   Social History  . Marital Status: Married    Spouse Name: N/A  . Number of Children: 2  . Years of Education: N/A   Occupational History  . disability    Social History Main Topics  . Smoking status: Current Every Day Smoker -- 1.00 packs/day    Types: Cigarettes  . Smokeless tobacco: Never Used  . Alcohol Use: No  . Drug Use: No  . Sexual  Activity:    Partners: Male   Other Topics Concern  . None   Social History Narrative   Husband on disability   Pt also on disability   Past Surgical History  Procedure Laterality Date  . Tonsillectomy    . Cholecystectomy    . Appendectomy    . Esophagogastroduodenoscopy  2010    showed erosions  . Colonoscopy w/ biopsies  2010    adenomatous polyp repeat 2015  . Lumbar fusion  2011    L4-L5-S1 Dr. Marcial Pacas  . Abdominal hysterectomy    . Colonoscopy with propofol N/A 03/13/2013    Procedure: COLONOSCOPY WITH PROPOFOL;  Surgeon: Lafayette Dragon, MD;  Location: [facility];  Service: Endoscopy;  Laterality: N/A;  . Upper gastrointestinal endoscopy  2017   Past Medical History  Diagnosis Date  . Hypertension   . Depression   . Irritable bowel syndrome   . Diabetes mellitus type II   . Panic attack as reaction to stress   . Insomnia     03-08-13"states is in control"  . Tremor   . Memory loss of unknown cause   . Back pain, chronic   . HX OF GALLSTONE 04/21/2008    Qualifier: Diagnosis of  By: Julaine Hua CMA ([place]), Estill Bamberg    . HEADACHE, CHRONIC 04/21/2008  . Complication of anesthesia  manic episode  . GERD (gastroesophageal reflux disease)   . H/O hiatal hernia   . Arthritis   . Anemia   . Syncope     Pt states happens once per week approximately  . Hyperlipidemia   . Seizures ([place])     Passes out frequently, but not seizures per pt   There were no vitals taken for this visit.  Opioid Risk Score:   Fall Risk Score:  `1  Depression screen PHQ 2/9  Depression screen [facility] 2/9 06/16/2015 05/15/2015 05/05/2015 09/16/2014 09/15/2014 03/13/2014 12/17/2013  Decreased Interest 0 0 0 2 2 1  0  Down, Depressed, Hopeless 0 0 0 2 2 2  0  PHQ - 2 Score 0 0 0 4 4 3  0  Altered sleeping - - - - 1 1 -  Tired, decreased energy - - - - 2 2 -  Change in appetite - - - - 1 0 -  Feeling bad or failure about yourself  - - - - 2 1 -  Trouble concentrating - - - - 3 2 -  Moving slowly or  fidgety/restless - - - - 0 1 -  Suicidal thoughts - - - - 0 0 -  PHQ-9 Score - - - - 13 10 -  Difficult doing work/chores - - - - Somewhat difficult - -     Review of Systems  Constitutional: Negative for fever.  HENT: Negative.   Eyes: Negative.   Respiratory: Negative.   Cardiovascular: Negative.   Endocrine: Negative.   Musculoskeletal: Positive for back pain, arthralgias and gait problem.  Skin: Negative.   Allergic/Immunologic: Negative.   Neurological: Positive for dizziness and weakness.  Hematological: Negative.   Psychiatric/Behavioral: Positive for dysphoric mood. The patient is nervous/anxious.   All other systems reviewed and are negative.      Objective:   Physical Exam  Constitutional: She is oriented to person, place, and time. She appears well-developed and well-nourished.  HENT:  Head: Normocephalic and atraumatic.  Eyes: Conjunctivae and EOM are normal. Pupils are equal, round, and reactive to light.  Musculoskeletal:       Lumbar back: She exhibits decreased range of motion and tenderness. She exhibits no deformity.  Pain to palpation bilateral L4-L5 paraspinal areas.  Neurological: She is alert and oriented to person, place, and time.  Psychiatric: She has a normal mood and affect.  Nursing note and vitals reviewed.  Morbidly obese female in no acute distress Mood and affect are appropriate Extremities no clubbing cyanosis or edema Her lumbar spine range of motion is good with forward flexion 100%, 50% lateral bending and rotation, 0-25% with extension. Extension is the most painful direction.        Assessment & Plan:  1. 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 She does have degeneration above the fusion  Corresponding to L2-3 L3-4 levels. She has had good response to lumbar radiofrequency In the upper lumbar area. It has been over 6 months Left T12 L1 L2 radiofrequency procedure. She would benefit from repeat procedure. At  patient request will reduce Duragesic patch from 25 g to 12 g. If she does well with this change would discontinue next month. If she does not, would increase once again to 25ug,  Continue oxycodone 7.5 mg 3 times a day

## 2015-07-09 ENCOUNTER — Encounter: Payer: Self-pay | Admitting: Gastroenterology

## 2015-07-09 ENCOUNTER — Ambulatory Visit (AMBULATORY_SURGERY_CENTER): Payer: BLUE CROSS/BLUE SHIELD | Admitting: Gastroenterology

## 2015-07-09 ENCOUNTER — Telehealth: Payer: Self-pay

## 2015-07-09 VITALS — BP 106/73 | HR 64 | Temp 98.7°F | Resp 19 | Ht 64.0 in | Wt 243.0 lb

## 2015-07-09 DIAGNOSIS — D120 Benign neoplasm of cecum: Secondary | ICD-10-CM

## 2015-07-09 DIAGNOSIS — D509 Iron deficiency anemia, unspecified: Secondary | ICD-10-CM

## 2015-07-09 LAB — GLUCOSE, CAPILLARY
GLUCOSE-CAPILLARY: 99 mg/dL (ref 65–99)
GLUCOSE-CAPILLARY: 96 mg/dL (ref 65–99)

## 2015-07-09 MED ORDER — SODIUM CHLORIDE 0.9 % IV SOLN: 500.0000 mL | INTRAVENOUS | Status: DC

## 2015-07-09 NOTE — Progress Notes (Signed)
Called to room to assist during endoscopic procedure.  Patient ID and intended procedure confirmed with present staff. Received instructions for my participation in the procedure from the performing physician.  

## 2015-07-09 NOTE — Progress Notes (Signed)
Patient awakening,vss,report to rn 

## 2015-07-09 NOTE — Op Note (Signed)
[place] Patient Name: Christine Bradshaw Procedure Date: 07/09/2015 7:47 AM MRN: [id number] Endoscopist: [place]. Loletha Carrow , MD Age: 50 Referring MD:  Date of Birth: [birth year] Gender: Female Account #: [id number] Procedure:                Colonoscopy Indications:              Iron deficiency anemia Medicines:                Monitored Anesthesia Care Procedure:                Pre-Anesthesia Assessment:                           - Prior to the procedure, a History and Physical                            was performed, and patient medications and                            allergies were reviewed. The patient's tolerance of                            previous anesthesia was also reviewed. The risks                            and benefits of the procedure and the sedation                            options and risks were discussed with the patient.                            All questions were answered, and informed consent                            was obtained. Prior Anticoagulants: The patient has                            taken aspirin, last dose was 1 day prior to                            procedure. ASA Grade Assessment: III - A patient                            with severe systemic disease. After reviewing the                            risks and benefits, the patient was deemed in                            satisfactory condition to undergo the procedure.                           After obtaining informed consent, the colonoscope  was passed under direct vision. Throughout the                            procedure, the patient's blood pressure, pulse, and                            oxygen saturations were monitored continuously. The                            Model [id number]-[id number]) scope was introduced                            through the anus and advanced to the the cecum,                            identified by appendiceal orifice  and ileocecal                            valve. The colonoscopy was somewhat difficult due                            to poor bowel prep. Successful completion of the                            procedure was aided by changing the patient to a                            supine position. The patient tolerated the                            procedure well. The quality of the bowel                            preparation was poor. The ileocecal valve,                            appendiceal orifice, and rectum were photographed.                            The bowel preparation used was Miralax. Scope In: 8:39:21 AM Scope Out: 9:11:45 AM Scope Withdrawal Time: 0 hours 21 minutes 51 seconds  Total Procedure Duration: 0 hours 32 minutes 24 seconds  Findings:                 The perianal and digital rectal examinations were                            normal.                           Multiple large-mouthed diverticula were found in                            the left colon.  A 8 mm polyp was found in the cecum. The polyp was                            sessile. The polyp was removed with a cold snare.                            Resection and retrieval were complete. To treat                            bleeding post-intervention, two hemostatic clips                            were successfully placed. There was no bleeding at                            the end of the procedure.                           The exam was otherwise without abnormality on                            direct and retroflexion views. Complications:            No immediate complications. Estimated Blood Loss:     Estimated blood loss: none. Impression:               - Preparation of the colon was poor.                           - Diverticulosis in the left colon.                           - One 8 mm polyp in the cecum, removed with a cold                            snare. Resected and retrieved. Clips  were placed.                           - The examination was otherwise normal on direct                            and retroflexion views. Recommendation:           - Patient has a contact number available for                            emergencies. The signs and symptoms of potential                            delayed complications were discussed with the                            patient. Return to normal activities tomorrow.  Written discharge instructions were provided to the                            patient.                           - Resume previous diet.                           - No aspirin, ibuprofen, naproxen, or other                            non-steroidal anti-inflammatory drugs for 7 days                            after polyp removal.                           - Await pathology results.                           - Repeat colonoscopy is recommended for                            surveillance. The colonoscopy date will be                            determined after pathology results from today's                            exam become available for review.                           - To visualize the small bowel, perform video                            capsule endoscopy. Our office will contact the                            patient to schedule the procedure. Henry L. Loletha Carrow, MD 07/09/2015 9:20:43 AM This report has been signed electronically.

## 2015-07-09 NOTE — Telephone Encounter (Addendum)
To visualize the small bowel, perform video capsule endoscopy. Our office will contact the patient to schedule the procedure.  See alternate note

## 2015-07-09 NOTE — Patient Instructions (Signed)
YOU HAD AN ENDOSCOPIC PROCEDURE TODAY AT [facility]:   Refer to the procedure report that was given to you for any specific questions about what was found during the examination.  If the procedure report does not answer your questions, please call your gastroenterologist to clarify.  If you requested that your care partner not be given the details of your procedure findings, then the procedure report has been included in a sealed envelope for you to review at your convenience later.  YOU SHOULD EXPECT: Some feelings of bloating in the abdomen. Passage of more gas than usual.  Walking can help get rid of the air that was put into your GI tract during the procedure and reduce the bloating. If you had a lower endoscopy (such as a colonoscopy or flexible sigmoidoscopy) you may notice spotting of blood in your stool or on the toilet paper. If you underwent a bowel prep for your procedure, you may not have a normal bowel movement for a few days.  Please Note:  You might notice some irritation and congestion in your nose or some drainage.  This is from the oxygen used during your procedure.  There is no need for concern and it should clear up in a day or so.  SYMPTOMS TO REPORT IMMEDIATELY:   Following lower endoscopy (colonoscopy or flexible sigmoidoscopy):  Excessive amounts of blood in the stool  Significant tenderness or worsening of abdominal pains  Swelling of the abdomen that is new, acute  Fever of 100F or higher    For urgent or emergent issues, a gastroenterologist can be reached at any hour by calling [phone].   DIET: Your first meal following the procedure should be a small meal and then it is ok to progress to your normal diet. Heavy or fried foods are harder to digest and may make you feel nauseous or bloated.  Likewise, meals heavy in dairy and vegetables can increase bloating.  Drink plenty of fluids but you should avoid alcoholic beverages for 24  hours.  ACTIVITY:  You should plan to take it easy for the rest of today and you should NOT DRIVE or use heavy machinery until tomorrow (because of the sedation medicines used during the test).    FOLLOW UP: Our staff will call the number listed on your records the next business day following your procedure to check on you and address any questions or concerns that you may have regarding the information given to you following your procedure. If we do not reach you, we will leave a message.  However, if you are feeling well and you are not experiencing any problems, there is no need to return our call.  We will assume that you have returned to your regular daily activities without incident.  If any biopsies were taken you will be contacted by phone or by letter within the next 1-3 weeks.  Please call us at [phone] if you have not heard about the biopsies in 3 weeks.    SIGNATURES/CONFIDENTIALITY: You and/or your care partner have signed paperwork which will be entered into your electronic medical record.  These signatures attest to the fact that that the information above on your After Visit Summary has been reviewed and is understood.  Full responsibility of the confidentiality of this discharge information lies with you and/or your care-partner.  NO ASPIRIN OR ANTI INFLAMMATORY PRODUCTS FOR 7 DAYS    CAPSULE ENDOSCOPY WILL BE SET UP BY DR Loletha Carrow' OFFICE AND  THEY WILL CONTACT YOU WITH DETAILS  INFORMATION ON POLYPS AND DIVERTICULOSIS GIVEN TO YOU TODAY

## 2015-07-10 ENCOUNTER — Telehealth: Payer: Self-pay

## 2015-07-10 NOTE — Telephone Encounter (Signed)
  Follow up Call-  Call back number 07/09/2015 06/18/2015  Post procedure Call Back phone  # [phone]  Permission to leave phone message Yes Yes     Patient questions:  Do you have a fever, pain , or abdominal swelling? No. Pain Score  0 *  Have you tolerated food without any problems? Yes.    Have you been able to return to your normal activities? Yes.    Do you have any questions about your discharge instructions: Diet   No. Medications  No. Follow up visit  No.  Do you have questions or concerns about your Care? No.  Actions: * If pain score is 4 or above: No action needed, pain <4.

## 2015-07-15 ENCOUNTER — Encounter: Payer: Self-pay | Admitting: Gastroenterology

## 2015-07-16 ENCOUNTER — Other Ambulatory Visit: Payer: Self-pay

## 2015-07-16 DIAGNOSIS — D509 Iron deficiency anemia, unspecified: Secondary | ICD-10-CM

## 2015-07-30 ENCOUNTER — Telehealth: Payer: Self-pay | Admitting: Gastroenterology

## 2015-07-30 NOTE — Telephone Encounter (Signed)
Pt has been re instructed and Resent to My Chart.  She will call back with any further concerns     Christine Bradshaw 1965/08/09 [id number]   07/24/15 Seven (7) days prior to capsule endoscopy stop taking iron supplements and carafate.  07/29/15 Two (2) days prior to capsule endoscopy stop taking aspirin or any arthritis drugs.  07/30/15 Day before capsule endoscopy purchase a 238 gram bottle of Miralax from the laxative section of your drug store, and a 32 oz. bottle of Gatorade (no red).   07/30/15 One (1) day prior to capsule endoscopy: Stop smoking. Eat a regular diet until 12:00 Noon. After 12:00 Noon take only the following: Black coffee Jell-O (no fruit or red Jell-o) Water Bouillon (chicken or beef) 7-Up Cranberry Juice Tea Kool-Aid Popsicle (not red) Sprite Coke Ginger Ale Pepsi Mountain Dew Gatorade At 6:00 pm the evening before your appointment, drink 7 capfuls (105 grams) of Miralax with 32 oz. Gatorade. Drink 8 oz every 15 minutes until gone. Nothing to eat or drink after midnight except medications with a sip of water.  07/31/15 Day of capsule endoscopy: Wear loose two-piece clothing to your exam. No medications for 2 hours prior to your test.  Please arrive at [facility] 3rd floor patient registration area by 8 am on: 07/31/15.   For any questions: Call [place] at [phone] and ask to speak with one of the capsule endoscopy nurses.

## 2015-07-31 ENCOUNTER — Ambulatory Visit (INDEPENDENT_AMBULATORY_CARE_PROVIDER_SITE_OTHER): Payer: BLUE CROSS/BLUE SHIELD | Admitting: Gastroenterology

## 2015-07-31 DIAGNOSIS — D500 Iron deficiency anemia secondary to blood loss (chronic): Secondary | ICD-10-CM

## 2015-07-31 NOTE — Progress Notes (Signed)
Patient here for capsule endoscopy. Tolerated procedure. Verbalizes understanding of written and verbal instructions. Accompanied by spouse. Cap ID #[id number] Lot [id number] Exp 2016-09-24

## 2015-08-02 ENCOUNTER — Other Ambulatory Visit: Payer: Self-pay | Admitting: Registered Nurse

## 2015-08-04 ENCOUNTER — Ambulatory Visit: Payer: BLUE CROSS/BLUE SHIELD | Admitting: Registered Nurse

## 2015-08-13 ENCOUNTER — Encounter: Payer: Self-pay | Admitting: Gastroenterology

## 2015-08-20 ENCOUNTER — Telehealth: Payer: Self-pay | Admitting: Gastroenterology

## 2015-08-20 NOTE — Telephone Encounter (Signed)
The pt was advised that she should continue iron an monitor Hgb and ferritin with PCP.   She will call if her levels do not respond or drop quickly and a small bowel enteroscopy may be needed.  Pt verbalized understanding of the instructions.

## 2015-08-25 ENCOUNTER — Ambulatory Visit: Payer: BLUE CROSS/BLUE SHIELD | Admitting: Registered Nurse

## 2015-08-26 ENCOUNTER — Ambulatory Visit (INDEPENDENT_AMBULATORY_CARE_PROVIDER_SITE_OTHER): Payer: BLUE CROSS/BLUE SHIELD | Admitting: Family Medicine

## 2015-08-26 ENCOUNTER — Encounter: Payer: Self-pay | Admitting: Family Medicine

## 2015-08-26 VITALS — BP 109/65 | HR 100 | Temp 98.4°F | Wt 232.4 lb

## 2015-08-26 DIAGNOSIS — E118 Type 2 diabetes mellitus with unspecified complications: Secondary | ICD-10-CM

## 2015-08-26 DIAGNOSIS — D509 Iron deficiency anemia, unspecified: Secondary | ICD-10-CM

## 2015-08-26 DIAGNOSIS — Z794 Long term (current) use of insulin: Secondary | ICD-10-CM

## 2015-08-26 DIAGNOSIS — G609 Hereditary and idiopathic neuropathy, unspecified: Secondary | ICD-10-CM | POA: Diagnosis not present

## 2015-08-26 DIAGNOSIS — E785 Hyperlipidemia, unspecified: Secondary | ICD-10-CM

## 2015-08-26 LAB — CBC
HEMATOCRIT: 46.4 % — AB (ref 35.0–45.0)
HEMOGLOBIN: 15.7 g/dL — AB (ref 11.7–15.5)
MCH: 29.8 pg (ref 27.0–33.0)
MCHC: 33.8 g/dL (ref 32.0–36.0)
MCV: 88 fL (ref 80.0–100.0)
MPV: 9.9 fL (ref 7.5–12.5)
Platelets: 341 10*3/uL (ref 140–400)
RBC: 5.27 MIL/uL — ABNORMAL HIGH (ref 3.80–5.10)
RDW: 16.6 % — AB (ref 11.0–15.0)
WBC: 10.6 10*3/uL (ref 3.8–10.8)

## 2015-08-26 LAB — IRON AND TIBC
%SAT: 8 % — ABNORMAL LOW (ref 11–50)
IRON: 37 ug/dL — AB (ref 45–160)
TIBC: 443 ug/dL (ref 250–450)
UIBC: 406 ug/dL — AB (ref 125–400)

## 2015-08-26 LAB — BASIC METABOLIC PANEL WITH GFR
BUN: 14 mg/dL (ref 7–25)
CO2: 30 mmol/L (ref 20–31)
Calcium: 10.2 mg/dL (ref 8.6–10.4)
Chloride: 101 mmol/L (ref 98–110)
Creat: 0.83 mg/dL (ref 0.50–1.05)
GFR, EST NON AFRICAN AMERICAN: 81 mL/min (ref >=60)
GFR, Est African American: >89 mL/min (ref >=60)
GLUCOSE: 81 mg/dL (ref 65–99)
POTASSIUM: 4.2 mmol/L (ref 3.5–5.3)
Sodium: 139 mmol/L (ref 135–146)

## 2015-08-26 LAB — POCT GLYCOSYLATED HEMOGLOBIN (HGB A1C): HEMOGLOBIN A1C: 5.4

## 2015-08-26 LAB — VITAMIN B12: Vitamin B-12: 480 pg/mL (ref 200–1100)

## 2015-08-26 LAB — FERRITIN: FERRITIN: 17 ng/mL (ref 10–232)

## 2015-08-26 LAB — TSH: TSH: 1.15 m[IU]/L

## 2015-08-26 MED ORDER — INSULIN GLARGINE 100 UNIT/ML SOLOSTAR PEN: 27.0000 [IU] | PEN_INJECTOR | Freq: Every day | SUBCUTANEOUS | 11 refills | Status: DC

## 2015-08-26 NOTE — Assessment & Plan Note (Signed)
A1c 5.4 today. Given that she has had some significant hypoglycemic episodes, will reduce to 27U daily today. Continue metformin. Given that patient is also interested in weight lose, may consider addition of GLP-1 agonist at future visit.

## 2015-08-26 NOTE — Assessment & Plan Note (Signed)
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 Will check iron stores today. Once normalized, GI recommended intermittently checking hgb and if she has another fast drop, may need small bowel enteroscopy.

## 2015-08-26 NOTE — Assessment & Plan Note (Signed)
Well controlled. Continue acupril. If continues to be low, can consider stopping at future office visits.

## 2015-08-26 NOTE — Patient Instructions (Signed)
Decrease your lantus to 27U. If your fasting sugars are more than 150, let me know.  We will check blood work today. Depending on the results, we may stop your iron.  We will also check your thyroid and B12 levels today. I think exercise is a great way to help lose weight. There are some other medications that could help you with weight loss that also treat your diabetes. We can discuss this at our next appointment.  See you again in 3 months.  Take care,  Dr Jerline Pain

## 2015-08-26 NOTE — Assessment & Plan Note (Signed)
LDL 40 on pravastatin. Will continue.

## 2015-08-26 NOTE — Progress Notes (Signed)
    Subjective:  Christine Bradshaw is a 50 y.o. female who presents to the [facility] today with a chief complaint of iron deficiency anemia follow up.   HPI:  Iron Deficiency Anemia Found 3 months ago. Was started on iron supplements and sent to GI. Patient has underwent full GI work up, including a capsule endoscopy recently that showed a full small bowel AVMs which may have been the source of her bleeding. Continues to have occasional fatigue and some shortness of breath. Very rarely has chest pain that feels like a anxiety attack. Pain lasts for 2-5 minutes then subsides after she relaxes. Pain is non exertional.   T2DM Patient currently taking lantus 32U daily. HAs been having issues with her blood sugar getting too low. Has had 3 CBGs in the 50s this week associated with diaphoresis and shaking. Fasting sugars usually in the 90 to 103 range. Has not been taking novolog. Has had frequent urination and increased thirst, but has been taking lasix daily.   Patient wants to have B12 and TSH checked today as she is having difficulty losing more weight. She has lost about 105 pounds over the past few years and is having difficulty losing more.    Hypertension BP Readings from Last 3 Encounters:  08/26/15 109/65  07/09/15 106/73  06/18/15 96/62   Home BP monitoring-Yes Compliant with medications-yes, without side effects ROS-Denies any CP, HA, SOB, blurry vision, LE edema, transient weakness, orthopnea, PND.   HLD Tolerating pravastatin without side effects. LDL 40 three months ago.   ROS: Per HPI  PMH: Smoking history reviewed.    Objective:  Physical Exam: BP 109/65 (BP Location: Right Arm, Patient Position: Sitting, Cuff Size: Normal)   Pulse 100   Temp 98.4 F (36.9 C) (Oral)   Wt 232 lb 6.4 oz (105.4 kg)   BMI 39.89 kg/m   Gen: NAD, resting comfortably CV: RRR with no murmurs appreciated Pulm: NWOB, CTAB with no crackles, wheezes, or rhonchi MSK: no edema, cyanosis, or  clubbing noted Skin: warm, dry Neuro: PERRL, EOMI, CN2-12 intact. No focal deficits.  Psych: Normal affect and thought content  Results for orders placed or performed in visit on 08/26/15 (from the past 72 hour(s))  HgB A1c     Status: None   Collection Time: 08/26/15  8:44 AM  Result Value Ref Range   Hemoglobin A1C 5.4    Assessment/Plan:  Iron deficiency anemia GI work up significant for a few small bowel AVMs. Will check iron stores today. Once normalized, GI recommended intermittently checking hgb and if she has another fast drop, may need small bowel enteroscopy.   Diabetes mellitus, type II, insulin dependent (HCC) A1c 5.4 today. Given that she has had some significant hypoglycemic episodes, will reduce to 27U daily today. Continue metformin. Given that patient is also interested in weight lose, may consider addition of GLP-1 agonist at future visit.   Essential hypertension Well controlled. Continue acupril. If continues to be low, can consider stopping at future office visits.   Hyperlipidemia LDL 40 on pravastatin. Will continue.    Algis Greenhouse. Jerline Pain, [place] Resident PGY-3 08/26/2015 11:03 AM

## 2015-08-27 ENCOUNTER — Telehealth: Payer: Self-pay | Admitting: Family Medicine

## 2015-08-27 MED ORDER — LIRAGLUTIDE 18 MG/3ML ~~LOC~~ SOPN: 0.6000 mg | PEN_INJECTOR | Freq: Every day | SUBCUTANEOUS | 1 refills | Status: DC

## 2015-08-27 NOTE — Telephone Encounter (Signed)
Called patient to discuss lab work. Will continue iron supplementation. Patient wanted to try GLP-1 agonist. Will send in [place]. Discussed hypoglycemic signs and symptoms. Patient will follow up in 2-3 weeks.  Algis Greenhouse. Jerline Pain, [place] Resident PGY-3 08/27/2015 12:10 PM

## 2015-09-01 ENCOUNTER — Encounter: Payer: BLUE CROSS/BLUE SHIELD | Attending: Physical Medicine & Rehabilitation | Admitting: Registered Nurse

## 2015-09-01 ENCOUNTER — Encounter: Payer: Self-pay | Admitting: Registered Nurse

## 2015-09-01 VITALS — BP 99/68 | HR 100

## 2015-09-01 DIAGNOSIS — G894 Chronic pain syndrome: Secondary | ICD-10-CM

## 2015-09-01 DIAGNOSIS — Z5181 Encounter for therapeutic drug level monitoring: Secondary | ICD-10-CM

## 2015-09-01 DIAGNOSIS — M961 Postlaminectomy syndrome, not elsewhere classified: Secondary | ICD-10-CM | POA: Diagnosis not present

## 2015-09-01 DIAGNOSIS — M7061 Trochanteric bursitis, right hip: Secondary | ICD-10-CM | POA: Diagnosis not present

## 2015-09-01 DIAGNOSIS — Z79899 Other long term (current) drug therapy: Secondary | ICD-10-CM

## 2015-09-01 DIAGNOSIS — M47816 Spondylosis without myelopathy or radiculopathy, lumbar region: Secondary | ICD-10-CM

## 2015-09-01 DIAGNOSIS — M7062 Trochanteric bursitis, left hip: Secondary | ICD-10-CM

## 2015-09-01 MED ORDER — OXYCODONE-ACETAMINOPHEN 7.5-325 MG PO TABS: 1.0000 | ORAL_TABLET | Freq: Three times a day (TID) | ORAL | 0 refills | Status: DC | PRN

## 2015-09-01 NOTE — Addendum Note (Signed)
Addended by: Gara Kroner L on: 09/01/2015 02:38 PM   Modules accepted: Orders

## 2015-09-01 NOTE — Progress Notes (Signed)
Subjective:    Patient ID: Christine Bradshaw, female    DOB: [birth year], 50 y.o.   MRN: [id number]  HPI: Christine Bradshaw is a 50 year old female who returns for follow up for chronic pain and medication refill. She states her pain is located in her lower back radiating into her left lower extremity laterally and bilateral hips left greater than right. She rates her pain 7. Her current exercise regime iswalking with her walker for long distances. Walks without walker in her home.  Pain Inventory Average Pain 7 Pain Right Now 7 My pain is sharp and burning  In the last 24 hours, has pain interfered with the following? General activity 7 Relation with others 5 Enjoyment of life 7 What TIME of day is your pain at its worst? morning, evening Sleep (in general) Good  Pain is worse with: walking, standing and some activites Pain improves with: rest, heat/ice, medication and injections Relief from Meds: 8  Mobility use a walker how many minutes can you walk? 2-4 ability to climb steps?  yes do you drive?  no use a wheelchair Do you have any goals in this area?  yes  Function not employed: date last employed NA I need assistance with the following:  meal prep, household duties and shopping Do you have any goals in this area?  yes  Neuro/Psych weakness spasms depression anxiety  Prior Studies Any changes since last visit?  no  Physicians involved in your care Any changes since last visit?  no   Family History  Problem Relation Age of Onset  . Hyperlipidemia Father   . Hypertension Father   . Heart disease Father   . Colon polyps Neg Hx   . Colon cancer Neg Hx    Social History   Social History  . Marital status: Married    Spouse name: N/A  . Number of children: 2  . Years of education: N/A   Occupational History  . disability    Social History Main Topics  . Smoking status: Current Every Day Smoker    Packs/day: 1.00    Types: Cigarettes  .  Smokeless tobacco: Never Used  . Alcohol use No  . Drug use: No  . Sexual activity: Yes    Partners: Male   Other Topics Concern  . None   Social History Narrative   Husband on disability   Pt also on disability   Past Surgical History:  Procedure Laterality Date  . ABDOMINAL HYSTERECTOMY    . APPENDECTOMY    . CHOLECYSTECTOMY    . COLONOSCOPY W/ BIOPSIES  2010   adenomatous polyp repeat 2015  . COLONOSCOPY WITH PROPOFOL N/A 03/13/2013   Procedure: COLONOSCOPY WITH PROPOFOL;  Surgeon: Lafayette Dragon, MD;  Location: [facility];  Service: Endoscopy;  Laterality: N/A;  . ESOPHAGOGASTRODUODENOSCOPY  2010   showed erosions  . LUMBAR FUSION  2011   L4-L5-S1 Dr. Marcial Pacas  . TONSILLECTOMY    . UPPER GASTROINTESTINAL ENDOSCOPY  2017   Past Medical History:  Diagnosis Date  . Anemia   . Arthritis   . Back pain, chronic   . Complication of anesthesia    manic episode  . Depression   . Diabetes mellitus type II   . GERD (gastroesophageal reflux disease)   . H/O hiatal hernia   . HEADACHE, CHRONIC 04/21/2008  . HX OF GALLSTONE 04/21/2008   Qualifier: Diagnosis of  By: Julaine Hua CMA ([place]), Estill Bamberg    . Hyperlipidemia   .  Hypertension   . Insomnia    03-08-13"states is in control"  . Irritable bowel syndrome   . Memory loss of unknown cause   . Panic attack as reaction to stress   . Seizures ([place])    Passes out frequently, but not seizures per pt  . Syncope    Pt states happens once per week approximately  . Tremor    BP 99/68   Pulse 100   SpO2 94%   Opioid Risk Score:   Fall Risk Score:  `1  Depression screen PHQ 2/9  Depression screen [facility] 2/9 06/16/2015 05/15/2015 05/05/2015 09/16/2014 09/15/2014 03/13/2014 12/17/2013  Decreased Interest 0 0 0 2 2 1  0  Down, Depressed, Hopeless 0 0 0 2 2 2  0  PHQ - 2 Score 0 0 0 4 4 3  0  Altered sleeping - - - - 1 1 -  Tired, decreased energy - - - - 2 2 -  Change in appetite - - - - 1 0 -  Feeling bad or failure about yourself  - - - - 2  1 -  Trouble concentrating - - - - 3 2 -  Moving slowly or fidgety/restless - - - - 0 1 -  Suicidal thoughts - - - - 0 0 -  PHQ-9 Score - - - - 13 10 -  Difficult doing work/chores - - - - Somewhat difficult - -    Review of Systems  Neurological: Positive for weakness.       Spasms   Psychiatric/Behavioral: Positive for dysphoric mood. The patient is nervous/anxious.   All other systems reviewed and are negative.      Objective:   Physical Exam  Constitutional: She is oriented to person, place, and time. She appears well-developed and well-nourished.  HENT:  Head: Normocephalic and atraumatic.  Neck: Normal range of motion. Neck supple.  Cardiovascular: Normal rate and regular rhythm.   Pulmonary/Chest: Effort normal and breath sounds normal.  Musculoskeletal:  Normal Muscle Bulk and Muscle Testing Reveals: Upper Extremities: Full ROM and Muscle Strength 5/5 Lumbar Paraspinal Tenderness: L-3- L-5 Left Greater Trochanteric Tenderness Lower Extremities: Full ROM and Muscle Strength 5/5 Arises from chair with ease using walker for support Narrow Based gait   Neurological: She is alert and oriented to person, place, and time.  Skin: Skin is warm and dry.  Psychiatric: She has a normal mood and affect.  Nursing note and vitals reviewed.         Assessment & Plan:  1. Lumbar postlaminectomy syndrome- status post L4-5 and L5-S1 fusion with chronic postoperative pain as well as a chronic left L5 radiculopathy. Refilled: Oxycodone 7.5/325 mg one tablet every 8 hours #90. We will continue the opioid monitoring program, this consists of regular clinic visits, examinations, urine drug screen, pill counts as well as use of [place] Controlled Substance Reporting System. Continue with heat and exercise therapy. 2. Lumbar spondylosis above the level of the fusion: Continue to Monitor 3. Bilateral Greater Trochanteric Bursitis:  Continue with heat, Ice therapy and HEP as  tolerated.  20 minutes of face to face patient care time was spent during this visit. All questions was encouraged and answered.   F/U in 1 month.

## 2015-09-09 ENCOUNTER — Ambulatory Visit (INDEPENDENT_AMBULATORY_CARE_PROVIDER_SITE_OTHER): Payer: BLUE CROSS/BLUE SHIELD | Admitting: Family Medicine

## 2015-09-09 ENCOUNTER — Encounter: Payer: Self-pay | Admitting: Family Medicine

## 2015-09-09 DIAGNOSIS — E119 Type 2 diabetes mellitus without complications: Secondary | ICD-10-CM | POA: Diagnosis not present

## 2015-09-09 DIAGNOSIS — Z794 Long term (current) use of insulin: Secondary | ICD-10-CM | POA: Diagnosis not present

## 2015-09-09 NOTE — Progress Notes (Signed)
    Subjective:  Christine Bradshaw is a 50 y.o. female who presents to the [facility] today with a chief complaint of T2DM follow up.   HPI:  T2DM Currently on 27U lantus and metformin 1000mg  twice daily. She is prescribed meal time novolog, but she has not had to use it. She was started on victoza 0.6mg  daily two weeks ago. She has tolerated this well without side effects. Her fasting sugars have been in the 80s-100s range. No hypoglycemic symptoms (palpitations, shakiness, diaphoresis). No polyuria or polydipsia.  ROS: Per HPI  Objective:  Physical Exam: BP 99/68   Pulse (!) 114   Temp 98.3 F (36.8 C) (Oral)   Ht 5\' 4"  (1.626 m)   Wt 232 lb (105.2 kg)   BMI 39.82 kg/m   Gen: NAD, resting comfortably CV: RRR with no murmurs appreciated Pulm: NWOB, CTAB with no crackles, wheezes, or rhonchi GI: Obese, Normal bowel sounds present. Soft, Nontender, Nondistended. MSK: no edema, cyanosis, or clubbing noted Skin: warm, dry Neuro: grossly normal, moves all extremities  Assessment/Plan:  Diabetes mellitus, type II, insulin dependent (HCC) Doing well on current regimen. Will increase Victoza to 1.2mg . Instructed patient to increase to 1.8mg  in 1-2 weeks if she does not have any symptomatic lows. Will otherwise continue current regimen. Return precautions reviewed. Follow up in 2-3 months.   Algis Greenhouse. Jerline Pain, [place] Resident PGY-3 09/09/2015 9:35 AM

## 2015-09-09 NOTE — Assessment & Plan Note (Signed)
Doing well on current regimen. Will increase Victoza to 1.2mg . Instructed patient to increase to 1.8mg  in 1-2 weeks if she does not have any symptomatic lows. Will otherwise continue current regimen. Return precautions reviewed. Follow up in 2-3 months.

## 2015-09-09 NOTE — Patient Instructions (Signed)
Please increase your victoza to 1.2mg . If this does well for a week or so, increase to 1.8mg .  If you have low blood sugars, please let us know.  Come back in 2-3 months for your next follow up, or sooner if you need anything else.  Take care,  Dr Jerline Pain

## 2015-09-10 LAB — TOXASSURE SELECT,+ANTIDEPR,UR: PDF: 0

## 2015-09-10 NOTE — Progress Notes (Signed)
Urine drug screen for this encounter is consistent for prescribed medications.   

## 2015-09-21 ENCOUNTER — Other Ambulatory Visit: Payer: Self-pay | Admitting: Family Medicine

## 2015-09-21 NOTE — Telephone Encounter (Signed)
Needs refill on victozia. She will be out of town for another 3 weeks.   [facility] [place]  Phone: [phone].   Fax: [phone]

## 2015-09-22 MED ORDER — LIRAGLUTIDE 18 MG/3ML ~~LOC~~ SOPN: 0.6000 mg | PEN_INJECTOR | Freq: Every day | SUBCUTANEOUS | 1 refills | Status: DC

## 2015-09-22 NOTE — Telephone Encounter (Signed)
Rx ordered for phone in.  Algis Greenhouse. Jerline Pain, [place] Resident PGY-3 09/22/2015 11:54 AM

## 2015-09-22 NOTE — Telephone Encounter (Signed)
Pharmacy found in epic and sent script to pharmacy. Jazmin Hartsell,CMA

## 2015-10-20 ENCOUNTER — Encounter: Payer: BLUE CROSS/BLUE SHIELD | Attending: Physical Medicine & Rehabilitation | Admitting: Registered Nurse

## 2015-10-20 ENCOUNTER — Encounter: Payer: Self-pay | Admitting: Registered Nurse

## 2015-10-20 VITALS — BP 100/70 | HR 104

## 2015-10-20 DIAGNOSIS — M47816 Spondylosis without myelopathy or radiculopathy, lumbar region: Secondary | ICD-10-CM

## 2015-10-20 DIAGNOSIS — Z79899 Other long term (current) drug therapy: Secondary | ICD-10-CM

## 2015-10-20 DIAGNOSIS — G894 Chronic pain syndrome: Secondary | ICD-10-CM

## 2015-10-20 DIAGNOSIS — M961 Postlaminectomy syndrome, not elsewhere classified: Secondary | ICD-10-CM | POA: Diagnosis not present

## 2015-10-20 DIAGNOSIS — M7061 Trochanteric bursitis, right hip: Secondary | ICD-10-CM

## 2015-10-20 DIAGNOSIS — Z5181 Encounter for therapeutic drug level monitoring: Secondary | ICD-10-CM

## 2015-10-20 DIAGNOSIS — M7062 Trochanteric bursitis, left hip: Secondary | ICD-10-CM

## 2015-10-20 MED ORDER — OXYCODONE-ACETAMINOPHEN 7.5-325 MG PO TABS: 1.0000 | ORAL_TABLET | Freq: Three times a day (TID) | ORAL | 0 refills | Status: DC | PRN

## 2015-10-20 MED ORDER — METHOCARBAMOL 500 MG PO TABS: 500.0000 mg | ORAL_TABLET | Freq: Three times a day (TID) | ORAL | 1 refills | Status: DC | PRN

## 2015-10-20 NOTE — Progress Notes (Signed)
Subjective:    Patient ID: Christine Bradshaw, female    DOB: 02-14-1965, 50 y.o.   MRN: [id number]  HPI: [name] is a 50 year old female who returns for follow up for chronic pain and medication refill. She states her pain is located in her lower back radiating into her  bilateral hips left greater than right. She rates her pain 7. Her current exercise regime iswalking with her walker for long distances. Walks without walker in her home.  Pain Inventory Average Pain 7 Pain Right Now 7 My pain is sharp and burning  In the last 24 hours, has pain interfered with the following? General activity 7 Relation with others 5 Enjoyment of life 7 What TIME of day is your pain at its worst? morning, evening Sleep (in general) NA  Pain is worse with: walking, standing and some activites Pain improves with: rest, heat/ice, medication and injections Relief from Meds: 7  Mobility walk without assistance use a walker how many minutes can you walk? 4 ability to climb steps?  yes do you drive?  no use a wheelchair transfers alone  Function not employed: date last employed n/a I need assistance with the following:  meal prep, household duties and shopping Do you have any goals in this area?  no  Neuro/Psych trouble walking spasms depression anxiety  Prior Studies Any changes since last visit?  no  Physicians involved in your care Any changes since last visit?  no   Family History  Problem Relation Age of Onset  . Hyperlipidemia Father   . Hypertension Father   . Heart disease Father   . Colon polyps Neg Hx   . Colon cancer Neg Hx    Social History   Social History  . Marital status: Married    Spouse name: N/A  . Number of children: 2  . Years of education: N/A   Occupational History  . disability    Social History Main Topics  . Smoking status: Current Every Day Smoker    Packs/day: 1.00    Types: Cigarettes  . Smokeless tobacco: Never Used    . Alcohol use No  . Drug use: No  . Sexual activity: Yes    Partners: Male   Other Topics Concern  . Not on file   Social History Narrative   Husband on disability   Pt also on disability   Past Surgical History:  Procedure Laterality Date  . ABDOMINAL HYSTERECTOMY    . APPENDECTOMY    . CHOLECYSTECTOMY    . COLONOSCOPY W/ BIOPSIES  2010   adenomatous polyp repeat 2015  . COLONOSCOPY WITH PROPOFOL N/A 03/13/2013   Procedure: COLONOSCOPY WITH PROPOFOL;  Surgeon: Lafayette Dragon, MD;  Location: [facility];  Service: Endoscopy;  Laterality: N/A;  . ESOPHAGOGASTRODUODENOSCOPY  2010   showed erosions  . LUMBAR FUSION  2011   L4-L5-S1 Dr. Marcial Pacas  . TONSILLECTOMY    . UPPER GASTROINTESTINAL ENDOSCOPY  2017   Past Medical History:  Diagnosis Date  . Anemia   . Arthritis   . Back pain, chronic   . Complication of anesthesia    manic episode  . Depression   . Diabetes mellitus type II   . GERD (gastroesophageal reflux disease)   . H/O hiatal hernia   . HEADACHE, CHRONIC 04/21/2008  . HX OF GALLSTONE 04/21/2008   Qualifier: Diagnosis of  By: Julaine Hua CMA ([place]), Estill Bamberg    . Hyperlipidemia   . Hypertension   .  Insomnia    03-08-13"states is in control"  . Irritable bowel syndrome   . Memory loss of unknown cause   . Panic attack as reaction to stress   . Seizures ([place])    Passes out frequently, but not seizures per pt  . Syncope    Pt states happens once per week approximately  . Tremor    There were no vitals taken for this visit.  Opioid Risk Score:   Fall Risk Score:  `1  Depression screen PHQ 2/9  Depression screen [facility] 2/9 09/09/2015 06/16/2015 05/15/2015 05/05/2015 09/16/2014 09/15/2014 03/13/2014  Decreased Interest 0 0 0 0 2 2 1   Down, Depressed, Hopeless 0 0 0 0 2 2 2   PHQ - 2 Score 0 0 0 0 4 4 3   Altered sleeping - - - - - 1 1  Tired, decreased energy - - - - - 2 2  Change in appetite - - - - - 1 0  Feeling bad or failure about yourself  - - - - - 2 1  Trouble  concentrating - - - - - 3 2  Moving slowly or fidgety/restless - - - - - 0 1  Suicidal thoughts - - - - - 0 0  PHQ-9 Score - - - - - 13 10  Difficult doing work/chores - - - - - Somewhat difficult -    Review of Systems  Musculoskeletal: Positive for gait problem.  Neurological:       Spasms  Psychiatric/Behavioral: Positive for dysphoric mood. The patient is nervous/anxious.   All other systems reviewed and are negative.      Objective:   Physical Exam  Constitutional: She is oriented to person, place, and time. She appears well-developed and well-nourished.  HENT:  Head: Normocephalic and atraumatic.  Neck: Normal range of motion. Neck supple.  Cardiovascular: Normal rate and regular rhythm.   Pulmonary/Chest: Effort normal and breath sounds normal.  Musculoskeletal:  Normal Muscle Bulk and Muscle Testing Reveals: Upper Extremities: Full ROM and Muscle Strength 5/5 Lumbar Paraspinal Tenderness Bilateral Greater Trochanteric Tenderness: L> R Lower Extremities: Full ROM and Muscle Strength 5/5 Arises from chair slowly using walker for support Narrow Based Gait   Neurological: She is alert and oriented to person, place, and time.  Skin: Skin is warm and dry.  Psychiatric: She has a normal mood and affect.  Nursing note and vitals reviewed.         Assessment & Plan:  1. Lumbar postlaminectomy syndrome- status post L4-5 and L5-S1 fusion with chronic postoperative pain as well as a chronic left L5 radiculopathy. Refilled: Oxycodone 7.5/325 mg one tablet every 8 hours #90. We will continue the opioid monitoring program, this consists of regular clinic visits, examinations, urine drug screen, pill counts as well as use of [place] Controlled Substance Reporting System. Continue with heat and exercise therapy. 2. Lumbar spondylosis above the level of the fusion: Continue to Monitor 3. Bilateral Greater Trochanteric Bursitis: Scheduled for Bilateral Hip Injection on  11/10/15 with Dr. Letta Pate. Continue with heat, Ice therapy and HEP as tolerated.  20 minutes of face to face patient care time was spent during this visit. All questions was encouraged and answered.   F/U in 1 month.

## 2015-11-06 ENCOUNTER — Other Ambulatory Visit: Payer: Self-pay | Admitting: Family Medicine

## 2015-11-06 MED ORDER — LIRAGLUTIDE 18 MG/3ML ~~LOC~~ SOPN: 1.8000 mg | PEN_INJECTOR | Freq: Every day | SUBCUTANEOUS | 11 refills | Status: DC

## 2015-11-06 NOTE — Telephone Encounter (Signed)
Needs refill on Victozia. She is taking 1.8 and the Rx is not lasting as long as it should. She has enough to last until Sunday/ she uses [facility] in [place]

## 2015-11-09 ENCOUNTER — Telehealth: Payer: Self-pay | Admitting: Physical Medicine & Rehabilitation

## 2015-11-09 NOTE — Telephone Encounter (Signed)
Should reschedule for next week

## 2015-11-09 NOTE — Telephone Encounter (Signed)
Patient is scheduled for a hip injection tomorrow and she had went to Urgent Care for a UTI, they put her on a antibiotic.  Can she still have the injection?  Please call patient.

## 2015-11-10 ENCOUNTER — Encounter: Payer: Self-pay | Admitting: Physical Medicine & Rehabilitation

## 2015-11-10 ENCOUNTER — Encounter: Payer: BLUE CROSS/BLUE SHIELD | Attending: Physical Medicine & Rehabilitation

## 2015-11-10 ENCOUNTER — Ambulatory Visit (HOSPITAL_BASED_OUTPATIENT_CLINIC_OR_DEPARTMENT_OTHER): Payer: BLUE CROSS/BLUE SHIELD | Admitting: Physical Medicine & Rehabilitation

## 2015-11-10 ENCOUNTER — Ambulatory Visit (INDEPENDENT_AMBULATORY_CARE_PROVIDER_SITE_OTHER): Payer: BLUE CROSS/BLUE SHIELD | Admitting: Family Medicine

## 2015-11-10 ENCOUNTER — Encounter: Payer: Self-pay | Admitting: Family Medicine

## 2015-11-10 VITALS — BP 135/81 | HR 93 | Temp 97.9°F

## 2015-11-10 VITALS — BP 101/69 | HR 95 | Temp 98.1°F | Resp 14

## 2015-11-10 DIAGNOSIS — M7062 Trochanteric bursitis, left hip: Secondary | ICD-10-CM | POA: Diagnosis not present

## 2015-11-10 DIAGNOSIS — N390 Urinary tract infection, site not specified: Secondary | ICD-10-CM

## 2015-11-10 DIAGNOSIS — R300 Dysuria: Secondary | ICD-10-CM | POA: Diagnosis not present

## 2015-11-10 DIAGNOSIS — M7061 Trochanteric bursitis, right hip: Secondary | ICD-10-CM

## 2015-11-10 DIAGNOSIS — M47816 Spondylosis without myelopathy or radiculopathy, lumbar region: Secondary | ICD-10-CM | POA: Insufficient documentation

## 2015-11-10 LAB — POCT URINALYSIS DIPSTICK
BILIRUBIN UA: NEGATIVE
GLUCOSE UA: NEGATIVE
Ketones, UA: NEGATIVE
NITRITE UA: NEGATIVE
PH UA: 7
Protein, UA: NEGATIVE
RBC UA: NEGATIVE
SPEC GRAV UA: 1.015
UROBILINOGEN UA: 0.2

## 2015-11-10 LAB — POCT UA - MICROSCOPIC ONLY

## 2015-11-10 MED ORDER — CEPHALEXIN 500 MG PO CAPS: 500.0000 mg | ORAL_CAPSULE | Freq: Two times a day (BID) | ORAL | 0 refills | Status: AC

## 2015-11-10 MED ORDER — OXYCODONE-ACETAMINOPHEN 7.5-325 MG PO TABS: 1.0000 | ORAL_TABLET | Freq: Three times a day (TID) | ORAL | 0 refills | Status: DC | PRN

## 2015-11-10 NOTE — Patient Instructions (Signed)
May use ice 20 minutes each and every 2 hours as needed for soreness related to injection. Injection may increased blood sugars, adjust insulin accordingly

## 2015-11-10 NOTE — Progress Notes (Signed)
    Subjective:  Christine Bradshaw is a 50 y.o. female who presents to the [facility] today with a chief complaint of UTI.   HPI:  UTI Patient diagnosed with a UTI at urgent care. Was having abdominal pain and dysuria for about 5 days. Two days ago went to urgent care and diagnosed her with a UTI. Was started on cipro, but only able to tolerate 3 doses due to side effects of weakness and ringing in her ears. She is currently still having some dysuria. No fevers. No back pain.   ROS: Per HPI  PMH: Smoking history reviewed.   Objective:  Physical Exam: BP 135/81   Pulse 93   Temp 97.9 F (36.6 C) (Oral)   SpO2 97%   Gen: NAD, resting comfortably CV: RRR with no murmurs appreciated Pulm: NWOB, CTAB with no crackles, wheezes, or rhonchi GI: Normal bowel sounds present. Soft, Nontender, Nondistended. MSK: no edema, cyanosis, or clubbing noted Skin: warm, dry Neuro: grossly normal, moves all extremities Psych: Normal affect and thought content  Results for orders placed or performed in visit on 11/10/15 (from the past 72 hour(s))  POCT urinalysis dipstick     Status: Abnormal   Collection Time: 11/10/15  2:49 PM  Result Value Ref Range   Color, UA YELLOW    Clarity, UA CLEAR    Glucose, UA NEG    Bilirubin, UA NEG    Ketones, UA NEG    Spec Grav, UA 1.015    Blood, UA NEG    pH, UA 7.0    Protein, UA NEG    Urobilinogen, UA 0.2    Nitrite, UA NEG    Leukocytes, UA small (1+) (A) Negative  POCT UA - Microscopic Only     Status: None   Collection Time: 11/10/15  2:49 PM  Result Value Ref Range   WBC, Ur, HPF, POC 0-3    RBC, urine, microscopic RARE    Bacteria, U Microscopic TRACE    Epithelial cells, urine per micros 0-3     Assessment/Plan:  UTI Will change treatment to keflex. Stop cipro. Will check urine culture. Strict return precautions reviewed. Follow up as needed.   Algis Greenhouse. Jerline Pain, [place] [facility] Resident PGY-3 11/10/2015 4:01 PM

## 2015-11-10 NOTE — Patient Instructions (Signed)
We will send in a new antibiotic for your UTI. Please take 1 pill twice a day for the next 7 days.   Come back to see me in 1 month to discuss your blood sugars.  Take care,  Dr Jerline Pain

## 2015-11-10 NOTE — Progress Notes (Signed)
Bilateral Trochanteric bursa injection With  ultrasound guidance  Indication Trochanteric bursitis. Exam has tenderness over the greater trochanter of the hip. Pain has not responded to conservative care such as exercise therapy and oral medications. Pain interferes with sleep or with mobility Informed consent was obtained after describing risks and benefits of the procedure with the patient these include bleeding bruising and infection. Patient has signed written consent form. Patient placed in a Left lateral decubitus position with the affected hip superior. Right Greater trochanter was visualized using curvilinear transducer area marked and prepped with Betadine.22-gauge 80 mm Echo block needle utilized And advanced to target the greater trochanter.Needle slightly withdrawn then 6mg  of betamethasone with 4 cc 1% lidocaine were injected. Same procedure was. On the left side Using same needle and technique as well as same injectate Patient tolerated procedure well. Post procedure instructions given.

## 2015-11-12 ENCOUNTER — Encounter: Payer: Self-pay | Admitting: Family Medicine

## 2015-11-12 LAB — URINE CULTURE: ORGANISM ID, BACTERIA: NO GROWTH

## 2015-12-02 ENCOUNTER — Telehealth: Payer: Self-pay | Admitting: Family Medicine

## 2015-12-02 ENCOUNTER — Encounter: Payer: Self-pay | Admitting: *Deleted

## 2015-12-02 NOTE — Telephone Encounter (Signed)
Mychart message sent to patient asking her to reschedule this appointment.  Did offer her an appointment that afternoon if she can make it then. Jazmin Hartsell,CMA

## 2015-12-08 ENCOUNTER — Encounter: Payer: Self-pay | Admitting: Family Medicine

## 2015-12-08 ENCOUNTER — Ambulatory Visit: Payer: BLUE CROSS/BLUE SHIELD | Admitting: Family Medicine

## 2015-12-08 ENCOUNTER — Ambulatory Visit (INDEPENDENT_AMBULATORY_CARE_PROVIDER_SITE_OTHER): Payer: BLUE CROSS/BLUE SHIELD | Admitting: Family Medicine

## 2015-12-08 VITALS — BP 124/78 | HR 88 | Temp 98.1°F | Ht 64.0 in | Wt 223.0 lb

## 2015-12-08 DIAGNOSIS — M549 Dorsalgia, unspecified: Secondary | ICD-10-CM

## 2015-12-08 DIAGNOSIS — Z794 Long term (current) use of insulin: Secondary | ICD-10-CM | POA: Diagnosis not present

## 2015-12-08 DIAGNOSIS — E118 Type 2 diabetes mellitus with unspecified complications: Secondary | ICD-10-CM

## 2015-12-08 DIAGNOSIS — I10 Essential (primary) hypertension: Secondary | ICD-10-CM

## 2015-12-08 DIAGNOSIS — D509 Iron deficiency anemia, unspecified: Secondary | ICD-10-CM

## 2015-12-08 DIAGNOSIS — E119 Type 2 diabetes mellitus without complications: Secondary | ICD-10-CM | POA: Diagnosis not present

## 2015-12-08 LAB — POCT GLYCOSYLATED HEMOGLOBIN (HGB A1C): HEMOGLOBIN A1C: 4.7

## 2015-12-08 LAB — CBC
HCT: 45 % (ref 35.0–45.0)
Hemoglobin: 15.2 g/dL (ref 11.7–15.5)
MCH: 31.7 pg (ref 27.0–33.0)
MCHC: 33.8 g/dL (ref 32.0–36.0)
MCV: 93.9 fL (ref 80.0–100.0)
MPV: 10 fL (ref 7.5–12.5)
Platelets: 288 10*3/uL (ref 140–400)
RBC: 4.79 MIL/uL (ref 3.80–5.10)
RDW: 14.2 % (ref 11.0–15.0)
WBC: 8 10*3/uL (ref 3.8–10.8)

## 2015-12-08 MED ORDER — ACETAMINOPHEN 500 MG PO TABS: 1000.0000 mg | ORAL_TABLET | Freq: Three times a day (TID) | ORAL | 11 refills | Status: DC

## 2015-12-08 MED ORDER — MELOXICAM 7.5 MG PO TABS: 7.5000 mg | ORAL_TABLET | Freq: Every day | ORAL | 0 refills | Status: DC

## 2015-12-08 MED ORDER — INSULIN GLARGINE 100 UNIT/ML SOLOSTAR PEN: 24.0000 [IU] | PEN_INJECTOR | Freq: Every day | SUBCUTANEOUS | 11 refills | Status: DC

## 2015-12-08 MED ORDER — IPRATROPIUM BROMIDE 0.03 % NA SOLN: 2.0000 | Freq: Two times a day (BID) | NASAL | 12 refills | Status: DC

## 2015-12-08 MED ORDER — OXYMETAZOLINE HCL 0.05 % NA SOLN: 1.0000 | Freq: Two times a day (BID) | NASAL | 0 refills | Status: DC

## 2015-12-08 NOTE — Assessment & Plan Note (Signed)
Will start tylenol 1000mg  tid today. Also gave Rx for mobic for patient to use if not improving with tylenol. Discussed long term side effects of NSAIDs including GI bleed and increased risk for cardiac events.

## 2015-12-08 NOTE — Progress Notes (Signed)
    Subjective:  Christine Bradshaw is a 50 y.o. female who presents to the [facility] today with a chief complaint of T2DM follow up.   HPI:  T2DM Currently on lantus 27U daily, metformin 100mg  bid, and liraglutide 1.8mg  daily. Is currently tolerating this regimen without any side effects. CBGs usually running in the 80s-120s. No symptomatic lows. Lowest CBG of 70.   URI Patient with URI symptoms for the last 3 days including congestion and cough. Husband has similar symptoms. She has tried taking benadryl, which has not helped. No fevers. No chills.  Iron Deficiency Anemia.  Currently on ferrous sulfate 325mg  tid. Tolerating this without any side effects.   Chronic Back Pain Patient with chronic back pain. She is being followed at the pain clinic for this. She was previously on diclofenac which was stopped last year. Patient says that the pain is worsening and is wondering if there is anything else to offer. No fevers or chills. No weakness or numbness.   Hypertension BP Readings from Last 3 Encounters:  12/08/15 124/78  11/10/15 135/81  11/10/15 101/69   Home BP monitoring-Yes Compliant with medications-yes, without side effects ROS-Denies any CP, HA, SOB, blurry vision, LE edema, transient weakness, orthopnea, PND.   HLD Tolerating pravastatin without side effects.   ROS: Per HPI  PMH: Smoking history reviewed.    Objective:  Physical Exam: BP 124/78   Pulse 88   Temp 98.1 F (36.7 C) (Oral)   Ht 5\' 4"  (1.626 m)   Wt 223 lb (101.2 kg)   BMI 38.28 kg/m   Gen: NAD, resting comfortably HEENT: Tms clear bilaterally. MMM. OP clear. Scant mucus drainage at nasal openings bilaterally.  CV: RRR with no murmurs appreciated Pulm: NWOB, CTAB with no crackles, wheezes, or rhonchi GI: Obese, Normal bowel sounds present. Soft, Nontender, Nondistended. MSK: no edema, cyanosis, or clubbing noted Skin: warm, dry Neuro: grossly normal, moves all extremities Psych: Normal affect and  thought content  Results for orders placed or performed in visit on 12/08/15 (from the past 72 hour(s))  POCT glycosylated hemoglobin (Hb A1C)     Status: None   Collection Time: 12/08/15  2:53 PM  Result Value Ref Range   Hemoglobin A1C 4.7    Assessment/Plan:  Diabetes mellitus, type II, insulin dependent ([facility]) Doing well with current regimen, though A1c very low to 4.7. Patient denies any symptomatic lows. Will decrease lantus to 24U daily, otherwise continue current regimen. Discussed hypoglycemic warning signs. Follow up in 3 months.   Iron deficiency anemia Check iron stores today. If normalized, would consider decreasing to once daily dosing, or stopping with intermittent checking.   Backache Will start tylenol 1000mg  tid today. Also gave Rx for mobic for patient to use if not improving with tylenol. Discussed long term side effects of NSAIDs including GI bleed and increased risk for cardiac events.   Essential hypertension Well controlled. Continue accupril.   Viral URI with cough Symptoms consistent with viral URI. Will treat with afrin for 3 days and atrovent. Return precautions reviewed.   Algis Greenhouse. Jerline Pain, [place] [facility] Resident PGY-3 12/08/2015 4:10 PM

## 2015-12-08 NOTE — Assessment & Plan Note (Addendum)
Doing well with current regimen, though A1c very low to 4.7. Patient denies any symptomatic lows. Will decrease lantus to 24U daily, otherwise continue current regimen. Discussed hypoglycemic warning signs. Follow up in 3 months.

## 2015-12-08 NOTE — Patient Instructions (Signed)
We will check your A1c today.  We will check your iron levels today.  Start taking tylenol 1000mg  every 6 hours for pain. We will also send in mobic to use if you are not getting better.  Start the afrin and atrovent nasal sprays. Please only use the afrin for 3 days max.  Come back to see me in 3 months, or sooner if you need anything else.  Take care,  Dr Jerline Pain

## 2015-12-08 NOTE — Assessment & Plan Note (Signed)
Check iron stores today. If normalized, would consider decreasing to once daily dosing, or stopping with intermittent checking.

## 2015-12-08 NOTE — Assessment & Plan Note (Signed)
Well controlled. Continue accupril.

## 2015-12-09 LAB — IRON AND TIBC
%SAT: 22 % (ref 11–50)
Iron: 91 ug/dL (ref 45–160)
TIBC: 408 ug/dL (ref 250–450)
UIBC: 317 ug/dL (ref 125–400)

## 2015-12-11 ENCOUNTER — Telehealth: Payer: Self-pay | Admitting: Family Medicine

## 2015-12-11 NOTE — Telephone Encounter (Signed)
Called patient to discuss lab results. Instructed her to decease PO iron to once a day. Labs otherwise unremarkable.  Algis Greenhouse. Jerline Pain, [place] Resident PGY-3 12/11/2015 10:55 AM

## 2015-12-17 ENCOUNTER — Ambulatory Visit: Payer: BLUE CROSS/BLUE SHIELD | Admitting: Registered Nurse

## 2016-01-08 ENCOUNTER — Other Ambulatory Visit: Payer: Self-pay | Admitting: Family Medicine

## 2016-01-08 NOTE — Telephone Encounter (Signed)
Pt needs refills on omega 3, omperazole, pravastatin, lasix, and quinapril. Pt uses [facility] mail order. Pt would like a 90 day supply. ep

## 2016-01-11 ENCOUNTER — Other Ambulatory Visit: Payer: Self-pay | Admitting: *Deleted

## 2016-01-11 MED ORDER — QUINAPRIL HCL 20 MG PO TABS: 20.0000 mg | ORAL_TABLET | Freq: Every day | ORAL | 3 refills | Status: DC

## 2016-01-11 MED ORDER — OMEGA-3-ACID ETHYL ESTERS 1 G PO CAPS: 2.0000 g | ORAL_CAPSULE | Freq: Two times a day (BID) | ORAL | 3 refills | Status: DC

## 2016-01-11 MED ORDER — METFORMIN HCL 1000 MG PO TABS: 1000.0000 mg | ORAL_TABLET | Freq: Two times a day (BID) | ORAL | 3 refills | Status: DC

## 2016-01-11 MED ORDER — OMEPRAZOLE 40 MG PO CPDR: 40.0000 mg | DELAYED_RELEASE_CAPSULE | Freq: Every day | ORAL | 3 refills | Status: DC

## 2016-01-11 MED ORDER — PRAVASTATIN SODIUM 40 MG PO TABS: 40.0000 mg | ORAL_TABLET | Freq: Every evening | ORAL | 3 refills | Status: DC

## 2016-01-12 ENCOUNTER — Encounter: Payer: Self-pay | Admitting: Registered Nurse

## 2016-01-12 ENCOUNTER — Encounter: Payer: BLUE CROSS/BLUE SHIELD | Attending: Physical Medicine & Rehabilitation | Admitting: Registered Nurse

## 2016-01-12 VITALS — BP 108/75 | HR 102

## 2016-01-12 DIAGNOSIS — G894 Chronic pain syndrome: Secondary | ICD-10-CM

## 2016-01-12 DIAGNOSIS — M961 Postlaminectomy syndrome, not elsewhere classified: Secondary | ICD-10-CM

## 2016-01-12 DIAGNOSIS — Z79899 Other long term (current) drug therapy: Secondary | ICD-10-CM

## 2016-01-12 DIAGNOSIS — Z5181 Encounter for therapeutic drug level monitoring: Secondary | ICD-10-CM

## 2016-01-12 DIAGNOSIS — M47816 Spondylosis without myelopathy or radiculopathy, lumbar region: Secondary | ICD-10-CM | POA: Diagnosis not present

## 2016-01-12 MED ORDER — OXYCODONE-ACETAMINOPHEN 7.5-325 MG PO TABS: 1.0000 | ORAL_TABLET | Freq: Three times a day (TID) | ORAL | 0 refills | Status: DC | PRN

## 2016-01-12 NOTE — Addendum Note (Signed)
Addended by: Marland Mcalpine B on: 01/12/2016 10:07 AM   Modules accepted: Orders

## 2016-01-12 NOTE — Progress Notes (Signed)
Subjective:    Patient ID: Christine Bradshaw, female    DOB: 1965/12/18, 51 y.o.   MRN: [id number]  HPI: [name] is a 51 year old female who returns for follow up appointment for chronic pain and medication refill. She states her pain is located in her lower back.She rates her pain 5. Her current exercise regime iswalking.  Pain Inventory Average Pain 5 Pain Right Now 5 My pain is .  In the last 24 hours, has pain interfered with the following? General activity 5 Relation with others 4 Enjoyment of life 6 What TIME of day is your pain at its worst? morning Sleep (in general) Good  Pain is worse with: walking, standing and some activites Pain improves with: rest, heat/ice, medication and injections Relief from Meds: 9  Mobility walk without assistance use a walker ability to climb steps?  yes do you drive?  no use a wheelchair transfers alone  Function I need assistance with the following:  meal prep, household duties and shopping  Neuro/Psych spasms depression anxiety  Prior Studies Any changes since last visit?  no  Physicians involved in your care Any changes since last visit?  no   Family History  Problem Relation Age of Onset  . Hyperlipidemia Father   . Hypertension Father   . Heart disease Father   . Colon polyps Neg Hx   . Colon cancer Neg Hx    Social History   Social History  . Marital status: Married    Spouse name: N/A  . Number of children: 2  . Years of education: N/A   Occupational History  . disability    Social History Main Topics  . Smoking status: Current Every Day Smoker    Packs/day: 1.00    Types: Cigarettes  . Smokeless tobacco: Never Used  . Alcohol use No  . Drug use: No  . Sexual activity: Yes    Partners: Male   Other Topics Concern  . Not on file   Social History Narrative   Husband on disability   Pt also on disability   Past Surgical History:  Procedure Laterality Date  . ABDOMINAL  HYSTERECTOMY    . APPENDECTOMY    . CHOLECYSTECTOMY    . COLONOSCOPY W/ BIOPSIES  2010   adenomatous polyp repeat 2015  . COLONOSCOPY WITH PROPOFOL N/A 03/13/2013   Procedure: COLONOSCOPY WITH PROPOFOL;  Surgeon: Lafayette Dragon, MD;  Location: [facility];  Service: Endoscopy;  Laterality: N/A;  . ESOPHAGOGASTRODUODENOSCOPY  2010   showed erosions  . LUMBAR FUSION  2011   L4-L5-S1 Dr. Marcial Pacas  . TONSILLECTOMY    . UPPER GASTROINTESTINAL ENDOSCOPY  2017   Past Medical History:  Diagnosis Date  . Anemia   . Arthritis   . Back pain, chronic   . Complication of anesthesia    manic episode  . Depression   . Diabetes mellitus type II   . GERD (gastroesophageal reflux disease)   . H/O hiatal hernia   . HEADACHE, CHRONIC 04/21/2008  . HX OF GALLSTONE 04/21/2008   Qualifier: Diagnosis of  By: Julaine Hua CMA ([place]), Estill Bamberg    . Hyperlipidemia   . Hypertension   . Insomnia    03-08-13"states is in control"  . Irritable bowel syndrome   . Memory loss of unknown cause   . Panic attack as reaction to stress   . Seizures ([place])    Passes out frequently, but not seizures per pt  . Syncope  Pt states happens once per week approximately  . Tremor    There were no vitals taken for this visit.  Opioid Risk Score:   Fall Risk Score:  `1  Depression screen PHQ 2/9  Depression screen [facility] 2/9 12/08/2015 11/10/2015 09/09/2015 06/16/2015 05/15/2015 05/05/2015 09/16/2014  Decreased Interest 0 0 0 0 0 0 2  Down, Depressed, Hopeless 0 0 0 0 0 0 2  PHQ - 2 Score 0 0 0 0 0 0 4  Altered sleeping - - - - - - -  Tired, decreased energy - - - - - - -  Change in appetite - - - - - - -  Feeling bad or failure about yourself  - - - - - - -  Trouble concentrating - - - - - - -  Moving slowly or fidgety/restless - - - - - - -  Suicidal thoughts - - - - - - -  PHQ-9 Score - - - - - - -  Difficult doing work/chores - - - - - - -   Review of Systems  Constitutional: Negative.   HENT: Negative.   Eyes: Negative.    Respiratory: Negative.   Cardiovascular: Negative.   Gastrointestinal: Negative.   Endocrine: Negative.   Genitourinary: Negative.   Musculoskeletal: Positive for back pain and gait problem.  Skin: Negative.   Allergic/Immunologic: Negative.   Hematological: Negative.   Psychiatric/Behavioral: Positive for dysphoric mood. The patient is nervous/anxious.   All other systems reviewed and are negative.      Objective:   Physical Exam  Constitutional: She appears well-developed and well-nourished.  HENT:  Head: Normocephalic and atraumatic.  Neck: Normal range of motion. Neck supple.  Cardiovascular: Normal rate and regular rhythm.   Nursing note and vitals reviewed.         Assessment & Plan:  1. Lumbar postlaminectomy syndrome- status post L4-5 and L5-S1 fusion with chronic postoperative pain as well as a chronic left L5 radiculopathy. Refilled: Oxycodone 7.5/325 mg one tablet every 8 hours #90. We will continue the opioid monitoring program, this consists of regular clinic visits, examinations, urine drug screen, pill counts as well as use of [place] Controlled Substance Reporting System. Continue with heat and exercise therapy. 2. Lumbar spondylosis above the level of the fusion: Continue to Monitor 3. Bilateral Greater Trochanteric Bursitis: No complaints Today. S/P  Bilateral Hip Injection on 11/10/15 with Dr. Letta Pate, with good relief noted. Continue with heat, Ice therapy and HEP as tolerated.  20 minutes of face to face patient care time was spent during this visit. All questions was encouraged and answered.   F/U in 1 month.

## 2016-01-15 ENCOUNTER — Other Ambulatory Visit: Payer: Self-pay | Admitting: Family Medicine

## 2016-01-15 NOTE — Telephone Encounter (Signed)
Pt is calling because we sent in mail orders prescriptions on 01/11/16. They only received 1 out of the 5 we sent in. We need to re-send her Accupril, Pravastatin, Prilosec, and Metformin. The mail order said that once they receive this it will at 10-15 business days for her to receive in the mail. Patient is requesting a 15 day supply of the Accupril, Pravastatin, Prilosec and Metformin be caller to her local pharmacy. She is using the [facility] in [place]

## 2016-01-18 MED ORDER — METFORMIN HCL 1000 MG PO TABS: 1000.0000 mg | ORAL_TABLET | Freq: Two times a day (BID) | ORAL | 3 refills | Status: DC

## 2016-01-18 MED ORDER — PRAVASTATIN SODIUM 40 MG PO TABS: 40.0000 mg | ORAL_TABLET | Freq: Every evening | ORAL | 3 refills | Status: DC

## 2016-01-18 MED ORDER — QUINAPRIL HCL 20 MG PO TABS: 20.0000 mg | ORAL_TABLET | Freq: Every day | ORAL | 3 refills | Status: DC

## 2016-01-18 MED ORDER — OMEPRAZOLE 40 MG PO CPDR: 40.0000 mg | DELAYED_RELEASE_CAPSULE | Freq: Every day | ORAL | 0 refills | Status: DC

## 2016-01-18 MED ORDER — PRAVASTATIN SODIUM 40 MG PO TABS: 40.0000 mg | ORAL_TABLET | Freq: Every evening | ORAL | 0 refills | Status: DC

## 2016-01-18 MED ORDER — METFORMIN HCL 1000 MG PO TABS: 1000.0000 mg | ORAL_TABLET | Freq: Two times a day (BID) | ORAL | 0 refills | Status: DC

## 2016-01-18 MED ORDER — OMEPRAZOLE 40 MG PO CPDR: 40.0000 mg | DELAYED_RELEASE_CAPSULE | Freq: Every day | ORAL | 3 refills | Status: DC

## 2016-01-18 MED ORDER — QUINAPRIL HCL 20 MG PO TABS: 20.0000 mg | ORAL_TABLET | Freq: Every day | ORAL | 0 refills | Status: DC

## 2016-01-18 NOTE — Telephone Encounter (Signed)
Pt informed of rx sent in

## 2016-01-18 NOTE — Telephone Encounter (Signed)
Rx sent in.  Algis Greenhouse. Jerline Pain, [place] Resident PGY-3 01/18/2016 1:45 PM

## 2016-01-19 LAB — TOXASSURE SELECT,+ANTIDEPR,UR

## 2016-01-21 ENCOUNTER — Telehealth: Payer: Self-pay | Admitting: Registered Nurse

## 2016-01-21 NOTE — Telephone Encounter (Signed)
On January 18,2018 NCCSR was reviewed: No conflict was seen on the [place] with Multiple Prescribers. Ms. Lenoir has a signed  Narcotic Contract with our office. If there were any discrepancies this would have been  reported to her  Physcian.

## 2016-01-26 NOTE — Progress Notes (Signed)
Urine drug screen for this encounter is consistent for prescribed medication 

## 2016-01-27 ENCOUNTER — Other Ambulatory Visit: Payer: Self-pay | Admitting: Family Medicine

## 2016-01-27 MED ORDER — FUROSEMIDE 40 MG PO TABS: ORAL_TABLET | ORAL | 3 refills | Status: DC

## 2016-01-27 NOTE — Telephone Encounter (Signed)
Pt needs a refill on Lasix. Pt uses [facility] pharm. ep

## 2016-02-01 ENCOUNTER — Other Ambulatory Visit: Payer: Self-pay | Admitting: Registered Nurse

## 2016-02-01 NOTE — Telephone Encounter (Signed)
Patient requests refill on robaxin 500mg , no mention in notes as to continue this medication, please advise

## 2016-02-17 ENCOUNTER — Other Ambulatory Visit: Payer: Self-pay | Admitting: *Deleted

## 2016-02-17 MED ORDER — METHOCARBAMOL 500 MG PO TABS: 500.0000 mg | ORAL_TABLET | Freq: Three times a day (TID) | ORAL | 1 refills | Status: DC | PRN

## 2016-02-23 ENCOUNTER — Encounter: Payer: BLUE CROSS/BLUE SHIELD | Attending: Physical Medicine & Rehabilitation | Admitting: Registered Nurse

## 2016-02-23 ENCOUNTER — Encounter: Payer: Self-pay | Admitting: Registered Nurse

## 2016-02-23 VITALS — BP 96/67 | HR 91

## 2016-02-23 DIAGNOSIS — M47816 Spondylosis without myelopathy or radiculopathy, lumbar region: Secondary | ICD-10-CM | POA: Diagnosis not present

## 2016-02-23 DIAGNOSIS — M7062 Trochanteric bursitis, left hip: Secondary | ICD-10-CM

## 2016-02-23 DIAGNOSIS — M7061 Trochanteric bursitis, right hip: Secondary | ICD-10-CM

## 2016-02-23 DIAGNOSIS — G894 Chronic pain syndrome: Secondary | ICD-10-CM | POA: Diagnosis not present

## 2016-02-23 DIAGNOSIS — Z5181 Encounter for therapeutic drug level monitoring: Secondary | ICD-10-CM

## 2016-02-23 DIAGNOSIS — M961 Postlaminectomy syndrome, not elsewhere classified: Secondary | ICD-10-CM | POA: Diagnosis not present

## 2016-02-23 DIAGNOSIS — Z79899 Other long term (current) drug therapy: Secondary | ICD-10-CM

## 2016-02-23 MED ORDER — OXYCODONE-ACETAMINOPHEN 7.5-325 MG PO TABS: 1.0000 | ORAL_TABLET | Freq: Three times a day (TID) | ORAL | 0 refills | Status: DC | PRN

## 2016-02-23 NOTE — Progress Notes (Signed)
Subjective:    Patient ID: Christine Bradshaw, female    DOB: 1965/07/04, 51 y.o.   MRN: [id number]  HPI: [name] is a 51 year old female who returns for follow up appointment for chronic pain and medication refill. She states her pain is located in her lower back mainly left side radiating into her bilateral hips left greater than right..She rates her pain 7. Her current exercise regime iswalking.  Pain Inventory Average Pain 7 Pain Right Now 7 My pain is sharp and stabbing  In the last 24 hours, has pain interfered with the following? General activity 6 Relation with others 5 Enjoyment of life 7 What TIME of day is your pain at its worst? daytime Sleep (in general) Good  Pain is worse with: walking, bending, standing and some activites Pain improves with: rest, heat/ice, medication and injections Relief from Meds: 7  Mobility use a walker ability to climb steps?  yes do you drive?  no use a wheelchair transfers alone  Function I need assistance with the following:  meal prep, household duties and shopping  Neuro/Psych spasms dizziness anxiety  Prior Studies Any changes since last visit?  no  Physicians involved in your care Any changes since last visit?  no   Family History  Problem Relation Age of Onset  . Hyperlipidemia Father   . Hypertension Father   . Heart disease Father   . Colon polyps Neg Hx   . Colon cancer Neg Hx    Social History   Social History  . Marital status: Married    Spouse name: N/A  . Number of children: 2  . Years of education: N/A   Occupational History  . disability    Social History Main Topics  . Smoking status: Current Every Day Smoker    Packs/day: 1.00    Types: Cigarettes  . Smokeless tobacco: Never Used  . Alcohol use No  . Drug use: No  . Sexual activity: Yes    Partners: Male   Other Topics Concern  . Not on file   Social History Narrative   Husband on disability   Pt also on  disability   Past Surgical History:  Procedure Laterality Date  . ABDOMINAL HYSTERECTOMY    . APPENDECTOMY    . CHOLECYSTECTOMY    . COLONOSCOPY W/ BIOPSIES  2010   adenomatous polyp repeat 2015  . COLONOSCOPY WITH PROPOFOL N/A 03/13/2013   Procedure: COLONOSCOPY WITH PROPOFOL;  Surgeon: Lafayette Dragon, MD;  Location: [facility];  Service: Endoscopy;  Laterality: N/A;  . ESOPHAGOGASTRODUODENOSCOPY  2010   showed erosions  . LUMBAR FUSION  2011   L4-L5-S1 Dr. Marcial Pacas  . TONSILLECTOMY    . UPPER GASTROINTESTINAL ENDOSCOPY  2017   Past Medical History:  Diagnosis Date  . Anemia   . Arthritis   . Back pain, chronic   . Complication of anesthesia    manic episode  . Depression   . Diabetes mellitus type II   . GERD (gastroesophageal reflux disease)   . H/O hiatal hernia   . HEADACHE, CHRONIC 04/21/2008  . HX OF GALLSTONE 04/21/2008   Qualifier: Diagnosis of  By: Julaine Hua CMA ([place]), Estill Bamberg    . Hyperlipidemia   . Hypertension   . Insomnia    03-08-13"states is in control"  . Irritable bowel syndrome   . Memory loss of unknown cause   . Panic attack as reaction to stress   . Seizures ([place])  Passes out frequently, but not seizures per pt  . Syncope    Pt states happens once per week approximately  . Tremor    There were no vitals taken for this visit.  Opioid Risk Score:   Fall Risk Score:  `1  Depression screen PHQ 2/9  Depression screen [facility] 2/9 12/08/2015 11/10/2015 09/09/2015 06/16/2015 05/15/2015 05/05/2015 09/16/2014  Decreased Interest 0 0 0 0 0 0 2  Down, Depressed, Hopeless 0 0 0 0 0 0 2  PHQ - 2 Score 0 0 0 0 0 0 4  Altered sleeping - - - - - - -  Tired, decreased energy - - - - - - -  Change in appetite - - - - - - -  Feeling bad or failure about yourself  - - - - - - -  Trouble concentrating - - - - - - -  Moving slowly or fidgety/restless - - - - - - -  Suicidal thoughts - - - - - - -  PHQ-9 Score - - - - - - -  Difficult doing work/chores - - - - - - -  Some  recent data might be hidden    Review of Systems  Constitutional: Negative.   HENT: Negative.   Eyes: Negative.   Respiratory: Negative.   Cardiovascular: Negative.   Gastrointestinal: Negative.   Endocrine: Negative.   Genitourinary: Negative.   Musculoskeletal: Positive for back pain.       Hip pain  Skin: Negative.   Allergic/Immunologic: Negative.   Neurological: Negative.   Hematological: Negative.   Psychiatric/Behavioral: Negative.   All other systems reviewed and are negative.      Objective:   Physical Exam  Constitutional: She is oriented to person, place, and time. She appears well-developed and well-nourished.  HENT:  Head: Normocephalic and atraumatic.  Neck: Normal range of motion. Neck supple.  Cardiovascular: Normal rate and regular rhythm.   Pulmonary/Chest: Effort normal and breath sounds normal.  Musculoskeletal:  Normal Muscle Bulk and Muscle Testing Reveals: Upper Extremities: Full ROM and Muscle Strength 5/5 Lumbar Paraspinal Tenderness: L-3-L-5 Mainly Left Side Bilateral Greater Trochanteric Tenderness: L>R Lower Extremities: Full ROM and Muscle Strength 5/5 Arises from Table slowly Narrow Based Gait  Neurological: She is alert and oriented to person, place, and time.  Skin: Skin is warm and dry.  Psychiatric: She has a normal mood and affect.  Nursing note and vitals reviewed.         Assessment & Plan:  1. Lumbar postlaminectomy syndrome- status post L4-5 and L5-S1 fusion with chronic postoperative pain as well as a chronic left L5 radiculopathy. 02/22/2014 Refilled: Oxycodone 7.5/325 mg one tablet every 8 hours #90. We will continue the opioid monitoring program, this consists of regular clinic visits, examinations, urine drug screen, pill counts as well as use of [place] Controlled Substance Reporting System. Continue with heat and exercise therapy. 2. Lumbar spondylosis above the level of the fusion: 02/23/2016 Continue to  Monitor 3. Bilateral Greater Trochanteric Bursitis: No complaints Today. 02/23/2016 Schedule  Bilateral Hip Injection with Dr. Letta Pate.Continue with heat, Ice therapy and HEP as tolerated. 02/23/2016  20 minutes of face to face patient care time was spent during this visit. All questions were encouraged and answered.  F/U in 1 month.

## 2016-03-07 ENCOUNTER — Ambulatory Visit (INDEPENDENT_AMBULATORY_CARE_PROVIDER_SITE_OTHER): Payer: BLUE CROSS/BLUE SHIELD | Admitting: Family Medicine

## 2016-03-07 ENCOUNTER — Encounter: Payer: Self-pay | Admitting: Family Medicine

## 2016-03-07 VITALS — BP 108/62 | HR 107 | Temp 98.2°F | Wt 217.0 lb

## 2016-03-07 DIAGNOSIS — E785 Hyperlipidemia, unspecified: Secondary | ICD-10-CM

## 2016-03-07 DIAGNOSIS — D509 Iron deficiency anemia, unspecified: Secondary | ICD-10-CM

## 2016-03-07 DIAGNOSIS — Z794 Long term (current) use of insulin: Secondary | ICD-10-CM

## 2016-03-07 DIAGNOSIS — E119 Type 2 diabetes mellitus without complications: Secondary | ICD-10-CM | POA: Diagnosis not present

## 2016-03-07 DIAGNOSIS — I10 Essential (primary) hypertension: Secondary | ICD-10-CM | POA: Diagnosis not present

## 2016-03-07 LAB — POCT GLYCOSYLATED HEMOGLOBIN (HGB A1C): Hemoglobin A1C: 4.7

## 2016-03-07 NOTE — Patient Instructions (Addendum)
  Your a1c today is 4.7  We will not make any medication changes today.  Please come back in 3 months to recheck your blood work.  Take care,  Dr Jerline Pain

## 2016-03-07 NOTE — Assessment & Plan Note (Signed)
Doing well on current regimen. A1c 4.7 today. Patient deferred decreasing dose of lantus at this point. Does not have any symptomatic lows. Will follow up in 3 months. Strongly suggest lowering lantus dose if A1c continues to be less than 6.

## 2016-03-07 NOTE — Assessment & Plan Note (Signed)
Currently on once daily oral iron. Will check iron stores again in 3 months. Plan for checking every 6-12 months going forward.

## 2016-03-07 NOTE — Assessment & Plan Note (Signed)
Continue pravastatin. Last LDL 40. Will check again in 3 months.

## 2016-03-07 NOTE — Assessment & Plan Note (Signed)
At goal. Continue acupril. Consider lowering dose if continues to be well controlled.

## 2016-03-07 NOTE — Progress Notes (Signed)
    Subjective:  Christine Bradshaw is a 51 y.o. female who presents to the [facility] today with a chief complaint of T2DM follow up.   HPI:  T2DM Currently on metformin 1000mg  bid,  lantus 24U daily and liraglutide 1.8mg  daily. She is tolerating this regimen well without side effects. No polyuria or polydipsia. No hypoglycemic symptoms (shakiness, sweating, palpitations, anxiety, etc.) Sugars have been running in the 90s.   Hypertension BP Readings from Last 3 Encounters:  03/07/16 108/62  02/23/16 96/67  01/12/16 108/75   Home BP monitoring-yes Compliant with medications-yes, without side effects ROS-Denies any CP, HA, SOB, blurry vision, LE edema, transient weakness, orthopnea, PND.   Iron Deficiency Anemia Taking PO iron once daily and is tolerating well. No overt signs of bleeding. No fatigue, shortness of breath, or chest pain.   HLD Tolerating pravastatin without side effects.   ROS: Per HPI  PMH: Smoking history reviewed.   Objective:  Physical Exam: BP 108/62   Pulse (!) 107   Temp 98.2 F (36.8 C) (Oral)   Wt 217 lb (98.4 kg)   SpO2 99%   BMI 37.25 kg/m   Gen: NAD, resting comfortably CV: RRR with no murmurs appreciated Pulm: NWOB, CTAB with no crackles, wheezes, or rhonchi MSK: no edema, cyanosis, or clubbing noted Skin: warm, dry Neuro: grossly normal, moves all extremities Psych: Normal affect and thought content  Results for orders placed or performed in visit on 03/07/16 (from the past 72 hour(s))  HgB A1c     Status: None   Collection Time: 03/07/16  9:00 AM  Result Value Ref Range   Hemoglobin A1C 4.7    Assessment/Plan:  Diabetes mellitus, type II, insulin dependent (HCC) Doing well on current regimen. A1c 4.7 today. Patient deferred decreasing dose of lantus at this point. Does not have any symptomatic lows. Will follow up in 3 months. Strongly suggest lowering lantus dose if A1c continues to be less than 6.   Essential hypertension At goal.  Continue acupril. Consider lowering dose if continues to be well controlled.   Hyperlipidemia Continue pravastatin. Last LDL 40. Will check again in 3 months.   Iron deficiency anemia Currently on once daily oral iron. Will check iron stores again in 3 months. Plan for checking every 6-12 months going forward.   Algis Greenhouse. Jerline Pain, [place] Resident PGY-3 03/07/2016 9:36 AM

## 2016-03-17 ENCOUNTER — Ambulatory Visit: Payer: BLUE CROSS/BLUE SHIELD

## 2016-03-17 ENCOUNTER — Encounter: Payer: Self-pay | Admitting: Physical Medicine & Rehabilitation

## 2016-03-17 ENCOUNTER — Encounter: Payer: BLUE CROSS/BLUE SHIELD | Attending: Physical Medicine & Rehabilitation

## 2016-03-17 ENCOUNTER — Ambulatory Visit (HOSPITAL_BASED_OUTPATIENT_CLINIC_OR_DEPARTMENT_OTHER): Payer: BLUE CROSS/BLUE SHIELD | Admitting: Physical Medicine & Rehabilitation

## 2016-03-17 VITALS — BP 94/67 | HR 95

## 2016-03-17 DIAGNOSIS — M7062 Trochanteric bursitis, left hip: Secondary | ICD-10-CM

## 2016-03-17 DIAGNOSIS — M47816 Spondylosis without myelopathy or radiculopathy, lumbar region: Secondary | ICD-10-CM | POA: Insufficient documentation

## 2016-03-17 DIAGNOSIS — M961 Postlaminectomy syndrome, not elsewhere classified: Secondary | ICD-10-CM

## 2016-03-17 DIAGNOSIS — M7061 Trochanteric bursitis, right hip: Secondary | ICD-10-CM

## 2016-03-17 MED ORDER — OXYCODONE-ACETAMINOPHEN 7.5-325 MG PO TABS: 1.0000 | ORAL_TABLET | Freq: Three times a day (TID) | ORAL | 0 refills | Status: DC | PRN

## 2016-03-17 NOTE — Patient Instructions (Signed)

## 2016-03-17 NOTE — Progress Notes (Signed)
Trochanteric bursa injection Withultrasound guidance  Indication Trochanteric bursitis. Exam has tenderness over the greater trochanter of the hip. Pain has not responded to conservative care such as exercise therapy and oral medications. Pain interferes with sleep or with mobility Informed consent was obtained after describing risks and benefits of the procedure with the patient these include bleeding bruising and infection. Patient has signed written consent form. Patient placed in a lateral decubitus position with the affected hip superior.Greater trochanter was scanned and identified,  marked and prepped with Betadine and entered with a needle to bone contact under direct [place] visualization. Needle slightly withdrawn then 6mg  of betamethasone with 4 cc 1% lidocaine were injected. Patient tolerated procedure well. Post procedure instructions given.  Continue weaning oxycodone next month may be able to go down to 5 mg every 8 hours

## 2016-03-23 ENCOUNTER — Telehealth: Payer: Self-pay | Admitting: *Deleted

## 2016-03-23 DIAGNOSIS — M25551 Pain in right hip: Secondary | ICD-10-CM

## 2016-03-23 DIAGNOSIS — M25552 Pain in left hip: Principal | ICD-10-CM

## 2016-03-23 NOTE — Telephone Encounter (Signed)
Patient left a message stating, hip injections did not work, still in pain, is there anything else that can be done

## 2016-03-24 NOTE — Telephone Encounter (Signed)
Please order bilateral hip and pelvis xrays

## 2016-03-28 NOTE — Telephone Encounter (Signed)
Order placed, patient notified.

## 2016-03-31 ENCOUNTER — Ambulatory Visit
Admission: RE | Admit: 2016-03-31 | Discharge: 2016-03-31 | Disposition: A | Discharge status: Home / Self Care | Payer: BLUE CROSS/BLUE SHIELD | Source: Ambulatory Visit | Attending: Physical Medicine & Rehabilitation | Admitting: Physical Medicine & Rehabilitation

## 2016-03-31 ENCOUNTER — Other Ambulatory Visit: Payer: Self-pay | Admitting: Physical Medicine & Rehabilitation

## 2016-03-31 DIAGNOSIS — M25552 Pain in left hip: Principal | ICD-10-CM

## 2016-03-31 DIAGNOSIS — M25551 Pain in right hip: Secondary | ICD-10-CM

## 2016-04-28 ENCOUNTER — Encounter: Payer: BLUE CROSS/BLUE SHIELD | Attending: Physical Medicine & Rehabilitation | Admitting: Registered Nurse

## 2016-04-28 ENCOUNTER — Encounter: Payer: Self-pay | Admitting: Registered Nurse

## 2016-04-28 ENCOUNTER — Telehealth: Payer: Self-pay | Admitting: Registered Nurse

## 2016-04-28 VITALS — BP 106/75 | HR 76 | Resp 14

## 2016-04-28 DIAGNOSIS — G894 Chronic pain syndrome: Secondary | ICD-10-CM | POA: Diagnosis not present

## 2016-04-28 DIAGNOSIS — M7062 Trochanteric bursitis, left hip: Secondary | ICD-10-CM

## 2016-04-28 DIAGNOSIS — Z79899 Other long term (current) drug therapy: Secondary | ICD-10-CM | POA: Diagnosis not present

## 2016-04-28 DIAGNOSIS — M961 Postlaminectomy syndrome, not elsewhere classified: Secondary | ICD-10-CM

## 2016-04-28 DIAGNOSIS — M62838 Other muscle spasm: Secondary | ICD-10-CM | POA: Diagnosis not present

## 2016-04-28 DIAGNOSIS — Z5181 Encounter for therapeutic drug level monitoring: Secondary | ICD-10-CM

## 2016-04-28 DIAGNOSIS — M47816 Spondylosis without myelopathy or radiculopathy, lumbar region: Secondary | ICD-10-CM | POA: Insufficient documentation

## 2016-04-28 MED ORDER — OXYCODONE-ACETAMINOPHEN 7.5-325 MG PO TABS: 1.0000 | ORAL_TABLET | Freq: Three times a day (TID) | ORAL | 0 refills | Status: DC | PRN

## 2016-04-28 NOTE — Progress Notes (Signed)
Subjective:    Patient ID: Christine Bradshaw, female    DOB: [birth year], 51 y.o.   MRN: [id number]  HPI: [name] is a 51 year old female who returns for follow up appointment for chronic pain and medication refill. She states her pain is located in her lower back mainly left side radiating into her left buttock, left hip and left lower extremity to her left knee. She rates her pain 5. Her current exercise regime iswalking. S/P Bilateral hip injections with good relief noted.  Last UDS was on 01/12/2016, it was consistent, scheduled for UDS today.  Ms. Acord will be out of town for the month of May, we will schedule her for 6 weeks, she verbalizes understanding.    Pain Inventory Average Pain 5 Pain Right Now 5 My pain is constant, burning and stabbing  In the last 24 hours, has pain interfered with the following? General activity 5 Relation with others 4 Enjoyment of life 5 What TIME of day is your pain at its worst? morning, evening Sleep (in general) Good  Pain is worse with: walking and standing Pain improves with: heat/ice, medication and injections Relief from Meds: 8  Mobility walk without assistance walk with assistance use a walker how many minutes can you walk? 10 ability to climb steps?  yes do you drive?  no use a wheelchair transfers alone  Function I need assistance with the following:  household duties and shopping  Neuro/Psych spasms depression anxiety  Prior Studies Any changes since last visit?  no  Physicians involved in your care Any changes since last visit?  no   Family History  Problem Relation Age of Onset  . Hyperlipidemia Father   . Hypertension Father   . Heart disease Father   . Colon polyps Neg Hx   . Colon cancer Neg Hx    Social History   Social History  . Marital status: Married    Spouse name: N/A  . Number of children: 2  . Years of education: N/A   Occupational History  . disability     Social History Main Topics  . Smoking status: Current Every Day Smoker    Packs/day: 1.00    Types: Cigarettes  . Smokeless tobacco: Never Used  . Alcohol use No  . Drug use: No  . Sexual activity: Yes    Partners: Male   Other Topics Concern  . None   Social History Narrative   Husband on disability   Pt also on disability   Past Surgical History:  Procedure Laterality Date  . ABDOMINAL HYSTERECTOMY    . APPENDECTOMY    . CHOLECYSTECTOMY    . COLONOSCOPY W/ BIOPSIES  2010   adenomatous polyp repeat 2015  . COLONOSCOPY WITH PROPOFOL N/A 03/13/2013   Procedure: COLONOSCOPY WITH PROPOFOL;  Surgeon: Lafayette Dragon, MD;  Location: [facility];  Service: Endoscopy;  Laterality: N/A;  . ESOPHAGOGASTRODUODENOSCOPY  2010   showed erosions  . LUMBAR FUSION  2011   L4-L5-S1 Dr. Marcial Pacas  . TONSILLECTOMY    . UPPER GASTROINTESTINAL ENDOSCOPY  2017   Past Medical History:  Diagnosis Date  . Anemia   . Arthritis   . Back pain, chronic   . Complication of anesthesia    manic episode  . Depression   . Diabetes mellitus type II   . GERD (gastroesophageal reflux disease)   . H/O hiatal hernia   . HEADACHE, CHRONIC 04/21/2008  . HX OF GALLSTONE 04/21/2008  Qualifier: Diagnosis of  By: Julaine Hua CMA ([place]), Estill Bamberg    . Hyperlipidemia   . Hypertension   . Insomnia    03-08-13"states is in control"  . Irritable bowel syndrome   . Memory loss of unknown cause   . Panic attack as reaction to stress   . Seizures ([place])    Passes out frequently, but not seizures per pt  . Syncope    Pt states happens once per week approximately  . Tremor    BP 106/75   Pulse 76   Resp 14   SpO2 96%   Opioid Risk Score:   Fall Risk Score:  `1  Depression screen PHQ 2/9  Depression screen [facility] 2/9 03/07/2016 12/08/2015 11/10/2015 09/09/2015 06/16/2015 05/15/2015 05/05/2015  Decreased Interest 0 0 0 0 0 0 0  Down, Depressed, Hopeless 0 0 0 0 0 0 0  PHQ - 2 Score 0 0 0 0 0 0 0  Altered sleeping - - - - -  - -  Tired, decreased energy - - - - - - -  Change in appetite - - - - - - -  Feeling bad or failure about yourself  - - - - - - -  Trouble concentrating - - - - - - -  Moving slowly or fidgety/restless - - - - - - -  Suicidal thoughts - - - - - - -  PHQ-9 Score - - - - - - -  Difficult doing work/chores - - - - - - -  Some recent data might be hidden    Review of Systems  Constitutional: Negative.   HENT: Negative.   Eyes: Negative.   Respiratory: Negative.   Cardiovascular: Negative.   Gastrointestinal: Negative.   Endocrine: Negative.   Genitourinary: Negative.   Musculoskeletal: Positive for back pain.  Skin: Negative.   Allergic/Immunologic: Negative.   Neurological: Negative.   Hematological: Negative.   Psychiatric/Behavioral: Positive for dysphoric mood. The patient is nervous/anxious.   All other systems reviewed and are negative.      Objective:   Physical Exam  Constitutional: She is oriented to person, place, and time. She appears well-developed and well-nourished.  HENT:  Head: Normocephalic and atraumatic.  Neck: Normal range of motion. Neck supple.  Cardiovascular: Normal rate and regular rhythm.   Pulmonary/Chest: Effort normal and breath sounds normal.  Musculoskeletal:  Normal Muscle Bulk and Muscle Testing Reveals: Upper Extremities: Full ROM and Muscle Strength 5/5 Lumbar Paraspinal Tenderness: L-3-L-5 Mainly Right Side Lower Extremities: Full ROM and Muscle Strength 5/5 Arises from chair with ease Narrow Based Gait   Neurological: She is alert and oriented to person, place, and time.  Skin: Skin is warm and dry.  Psychiatric: She has a normal mood and affect.  Nursing note and vitals reviewed.         Assessment & Plan:  1. Lumbar postlaminectomy syndrome- status post L4-5 and L5-S1 fusion with chronic postoperative pain as well as a chronic left L5 radiculopathy. 04/29/2014 Refilled: Oxycodone 7.5/325 mg one tablet every 8 hours  #90. We will continue the opioid monitoring program, this consists of regular clinic visits, examinations, urine drug screen, pill counts as well as use of [place] Controlled Substance Reporting System. Continue with heat and exercise therapy. 2. Lumbar spondylosis above the level of the fusion: 04/28/2016 Continue to Monitor 3. Bilateral Greater Trochanteric Bursitis: S/P Bilateral Hip Injections with good relief noted. Continue with heat, Ice therapy and HEP as tolerated. 04/28/2016 4.  Muscle Spasms: Continue Robaxin as needed.  20  minutes of face to face patient care time was spent during this visit. All questions were encouraged and answered.  F/U in 1 month.

## 2016-04-28 NOTE — Telephone Encounter (Signed)
On 04/28/2016 the  [place] was reviewed no conflict was seen on the [place] with multiple prescribers. Christine Bradshaw has a signed narcotic contract with our office. If there were any discrepancies this would have been reported to her physician.

## 2016-05-04 LAB — TOXASSURE SELECT,+ANTIDEPR,UR

## 2016-05-05 ENCOUNTER — Telehealth: Payer: Self-pay | Admitting: *Deleted

## 2016-05-05 NOTE — Telephone Encounter (Signed)
Urine drug screen for this encounter is consistent for prescribed medication.  She is prescribed percocet so oxycodone is expected.

## 2016-05-16 ENCOUNTER — Other Ambulatory Visit: Payer: Self-pay | Admitting: Family Medicine

## 2016-05-16 MED ORDER — INSULIN GLARGINE 100 UNIT/ML SOLOSTAR PEN: 24.0000 [IU] | PEN_INJECTOR | Freq: Every day | SUBCUTANEOUS | 11 refills | Status: DC

## 2016-05-16 NOTE — Telephone Encounter (Signed)
Rx filled.  Algis Greenhouse. Jerline Pain, [place] [facility] Resident PGY-3 05/16/2016 2:40 PM

## 2016-05-16 NOTE — Telephone Encounter (Signed)
Pt is calling from out of town. She is on vacation in [place]. She needs a refill on her Lantus called into the [facility] in [place]. There number is [phone]. [email or web address]

## 2016-06-08 ENCOUNTER — Telehealth: Payer: Self-pay | Admitting: Family Medicine

## 2016-06-08 ENCOUNTER — Encounter: Payer: Self-pay | Admitting: Family Medicine

## 2016-06-08 ENCOUNTER — Ambulatory Visit (INDEPENDENT_AMBULATORY_CARE_PROVIDER_SITE_OTHER): Payer: BLUE CROSS/BLUE SHIELD | Admitting: Family Medicine

## 2016-06-08 VITALS — BP 110/60 | HR 84 | Temp 98.5°F | Ht 64.0 in | Wt 212.0 lb

## 2016-06-08 DIAGNOSIS — E119 Type 2 diabetes mellitus without complications: Secondary | ICD-10-CM

## 2016-06-08 DIAGNOSIS — D509 Iron deficiency anemia, unspecified: Secondary | ICD-10-CM | POA: Diagnosis not present

## 2016-06-08 DIAGNOSIS — Z794 Long term (current) use of insulin: Secondary | ICD-10-CM

## 2016-06-08 DIAGNOSIS — E785 Hyperlipidemia, unspecified: Secondary | ICD-10-CM

## 2016-06-08 DIAGNOSIS — I10 Essential (primary) hypertension: Secondary | ICD-10-CM | POA: Diagnosis not present

## 2016-06-08 LAB — POCT GLYCOSYLATED HEMOGLOBIN (HGB A1C): HEMOGLOBIN A1C: 4.9

## 2016-06-08 MED ORDER — QUINAPRIL HCL 10 MG PO TABS: 10.0000 mg | ORAL_TABLET | Freq: Every day | ORAL | 3 refills | Status: DC

## 2016-06-08 MED ORDER — INSULIN GLARGINE 100 UNIT/ML SOLOSTAR PEN: 22.0000 [IU] | PEN_INJECTOR | Freq: Every day | SUBCUTANEOUS | 11 refills | Status: DC

## 2016-06-08 NOTE — Assessment & Plan Note (Signed)
Continue pravastatin. Check lipid panel today.

## 2016-06-08 NOTE — Telephone Encounter (Signed)
Pt contacted and a VM was left informing her rx called in.

## 2016-06-08 NOTE — Assessment & Plan Note (Signed)
A1c 4.9 today. Will decrease lantus to 22 units daily. Continue liraglutide. Patient is very hesitant to make changes to her regimen. Follow up in 3 months. Continue to titrate down lantus if A1c continues to be less than 6.

## 2016-06-08 NOTE — Patient Instructions (Addendum)
Decrease your lantus to 22 units daily.  We will check blood work.  Come back to meet your new doctor in 3 months.  Take care,  Dr Jerline Pain

## 2016-06-08 NOTE — Progress Notes (Signed)
   Subjective:  Christine Bradshaw is a 51 y.o. female who presents to the [facility] today with a chief complaint of T2DM.   HPI:  T2DM Patient currently on lantus 24 units daily and liraglutide 1.8mg  daily. She has been tolerating this regimen well without side effects. Sugars have been running in the 80s-100s. Lowest CBG of 65, and had some sweaty palms with this. Very rarely gets symptomatic lows. Highest CBG of 140. No polyuria or polydipsia.   Hypertension BP Readings from Last 3 Encounters:  06/08/16 110/60  04/28/16 106/75  03/17/16 94/67   Home BP monitoring-No Compliant with medications-yes. without side effects ROS-Denies any CP, HA, SOB, blurry vision, LE edema, transient weakness, orthopnea, PND.   HLD Tolerating pravastatin without side effects.  Iron Deficiency Anemia Currently on iron once a day and tolerating well.   ROS: Per HPI  PMH: Smoking history reviewed.   Objective:  Physical Exam: BP 110/60   Pulse 84   Temp 98.5 F (36.9 C) (Oral)   Ht 5\' 4"  (1.626 m)   Wt 212 lb (96.2 kg)   SpO2 99%   BMI 36.39 kg/m   Gen: NAD, resting comfortably CV: RRR with no murmurs appreciated Pulm: NWOB, CTAB with no crackles, wheezes, or rhonchi GI: Normal bowel sounds present. Soft, Nontender, Nondistended. MSK: no edema, cyanosis, or clubbing noted Skin: warm, dry Neuro: grossly normal, moves all extremities Psych: Normal affect and thought content  Results for orders placed or performed in visit on 06/08/16 (from the past 72 hour(s))  POCT glycosylated hemoglobin (Hb A1C)     Status: None   Collection Time: 06/08/16  9:05 AM  Result Value Ref Range   Hemoglobin A1C 4.9    Assessment/Plan:  Diabetes mellitus, type II, insulin dependent (HCC) A1c 4.9 today. Will decrease lantus to 22 units daily. Continue liraglutide. Patient is very hesitant to make changes to her regimen. Follow up in 3 months. Continue to titrate down lantus if A1c continues to be less than 6.    Essential hypertension At goal. Discussed lowering dose of acupril, however patient deferred. Check CMET today.   Hyperlipidemia Continue pravastatin. Check lipid panel today.   Iron deficiency anemia Check iron stores today.   Algis Greenhouse. Jerline Pain, [place] Resident PGY-3 06/08/2016 9:37 AM

## 2016-06-08 NOTE — Assessment & Plan Note (Signed)
At goal. Discussed lowering dose of acupril, however patient deferred. Check CMET today.

## 2016-06-08 NOTE — Telephone Encounter (Signed)
Pt has decided to lower the dosage on her blood pressure. Please call in the new RX to [place] [facility]

## 2016-06-08 NOTE — Assessment & Plan Note (Signed)
Check iron stores today.

## 2016-06-08 NOTE — Telephone Encounter (Signed)
Will forward to MD to advise. Corky Blumstein,CMA  

## 2016-06-08 NOTE — Telephone Encounter (Signed)
Rx sent in.  Algis Greenhouse. Jerline Pain, [place] Resident PGY-3 06/08/2016 12:05 PM

## 2016-06-09 ENCOUNTER — Encounter: Payer: Self-pay | Admitting: Family Medicine

## 2016-06-09 ENCOUNTER — Encounter: Payer: Self-pay | Admitting: Registered Nurse

## 2016-06-09 ENCOUNTER — Encounter: Payer: BLUE CROSS/BLUE SHIELD | Attending: Physical Medicine & Rehabilitation | Admitting: Registered Nurse

## 2016-06-09 VITALS — BP 102/70 | HR 84 | Resp 14

## 2016-06-09 DIAGNOSIS — M47816 Spondylosis without myelopathy or radiculopathy, lumbar region: Secondary | ICD-10-CM | POA: Diagnosis not present

## 2016-06-09 DIAGNOSIS — G894 Chronic pain syndrome: Secondary | ICD-10-CM

## 2016-06-09 DIAGNOSIS — M961 Postlaminectomy syndrome, not elsewhere classified: Secondary | ICD-10-CM | POA: Diagnosis not present

## 2016-06-09 DIAGNOSIS — M5416 Radiculopathy, lumbar region: Secondary | ICD-10-CM | POA: Diagnosis not present

## 2016-06-09 DIAGNOSIS — Z5181 Encounter for therapeutic drug level monitoring: Secondary | ICD-10-CM

## 2016-06-09 DIAGNOSIS — M62838 Other muscle spasm: Secondary | ICD-10-CM

## 2016-06-09 DIAGNOSIS — Z79899 Other long term (current) drug therapy: Secondary | ICD-10-CM | POA: Diagnosis not present

## 2016-06-09 LAB — LIPID PANEL
CHOL/HDL RATIO: 3.4 ratio (ref 0.0–4.4)
CHOLESTEROL TOTAL: 125 mg/dL (ref 100–199)
HDL: 37 mg/dL — ABNORMAL LOW (ref >39)
LDL Calculated: 42 mg/dL (ref 0–99)
TRIGLYCERIDES: 232 mg/dL — AB (ref 0–149)
VLDL Cholesterol Cal: 46 mg/dL — ABNORMAL HIGH (ref 5–40)

## 2016-06-09 LAB — CMP14+EGFR
A/G RATIO: 2.1 (ref 1.2–2.2)
ALK PHOS: 74 IU/L (ref 39–117)
ALT: 21 IU/L (ref 0–32)
AST: 24 IU/L (ref 0–40)
Albumin: 4.7 g/dL (ref 3.5–5.5)
BUN / CREAT RATIO: 20 (ref 9–23)
BUN: 17 mg/dL (ref 6–24)
Bilirubin Total: 0.3 mg/dL (ref 0.0–1.2)
CO2: 26 mmol/L (ref 18–29)
Calcium: 9.7 mg/dL (ref 8.7–10.2)
Chloride: 95 mmol/L — ABNORMAL LOW (ref 96–106)
Creatinine, Ser: 0.86 mg/dL (ref 0.57–1.00)
GFR calc Af Amer: 89 mL/min/{1.73_m2} (ref >59)
GFR, EST NON AFRICAN AMERICAN: 77 mL/min/{1.73_m2} (ref >59)
GLOBULIN, TOTAL: 2.2 g/dL (ref 1.5–4.5)
Glucose: 99 mg/dL (ref 65–99)
POTASSIUM: 4.4 mmol/L (ref 3.5–5.2)
SODIUM: 139 mmol/L (ref 134–144)
Total Protein: 6.9 g/dL (ref 6.0–8.5)

## 2016-06-09 LAB — CBC
Hematocrit: 42.7 % (ref 34.0–46.6)
Hemoglobin: 14.3 g/dL (ref 11.1–15.9)
MCH: 31.2 pg (ref 26.6–33.0)
MCHC: 33.5 g/dL (ref 31.5–35.7)
MCV: 93 fL (ref 79–97)
PLATELETS: 333 10*3/uL (ref 150–379)
RBC: 4.59 x10E6/uL (ref 3.77–5.28)
RDW: 13.5 % (ref 12.3–15.4)
WBC: 8.6 10*3/uL (ref 3.4–10.8)

## 2016-06-09 LAB — IRON AND TIBC
IRON SATURATION: 19 % (ref 15–55)
IRON: 73 ug/dL (ref 27–159)
TIBC: 390 ug/dL (ref 250–450)
UIBC: 317 ug/dL (ref 131–425)

## 2016-06-09 LAB — FERRITIN: Ferritin: 42 ng/mL (ref 15–150)

## 2016-06-09 MED ORDER — OXYCODONE-ACETAMINOPHEN 7.5-325 MG PO TABS: 1.0000 | ORAL_TABLET | Freq: Three times a day (TID) | ORAL | 0 refills | Status: DC | PRN

## 2016-06-09 NOTE — Progress Notes (Signed)
Subjective:    Patient ID: Christine Bradshaw, female    DOB: Nov 14, 1965, 51 y.o.   MRN: [id number]  HPI: [name] is a 51 year old female who returns for follow up appointment for chronic pain and medication refill. She states her pain is located in her lower back mainly left side radiating into her left buttock. She rates her pain 5. Her current exercise regime iswalking.  We discuss hopefully being able to  taper Oxycodone to 5 mg per Dr. Letta Pate note, Ms. Reiger states she has become more active and her pain is controlled with this current dose. We will review next month, she verbalizes understanding.   Last UDS was on 04/28/2016, it was consistent.   Pain Inventory Average Pain 4 Pain Right Now 5 My pain is sharp and burning  In the last 24 hours, has pain interfered with the following? General activity 5 Relation with others 4 Enjoyment of life 4 What TIME of day is your pain at its worst? morning, evening Sleep (in general) Good  Pain is worse with: walking, standing and some activites Pain improves with: heat/ice, medication and injections Relief from Meds: 9  Mobility walk without assistance walk with assistance use a walker how many minutes can you walk? 5 ability to climb steps?  yes do you drive?  no use a wheelchair transfers alone Do you have any goals in this area?  yes  Function not employed: date last employed .  Neuro/Psych spasms anxiety  Prior Studies Any changes since last visit?  no  Physicians involved in your care Any changes since last visit?  no   Family History  Problem Relation Age of Onset  . Hyperlipidemia Father   . Hypertension Father   . Heart disease Father   . Colon polyps Neg Hx   . Colon cancer Neg Hx    Social History   Social History  . Marital status: Married    Spouse name: N/A  . Number of children: 2  . Years of education: N/A   Occupational History  . disability    Social History  Main Topics  . Smoking status: Current Every Day Smoker    Types: Cigarettes  . Smokeless tobacco: Never Used     Comment: working on quitting.  Down to 3 cigs/day 06/08/16  . Alcohol use No  . Drug use: No  . Sexual activity: Yes    Partners: Male   Other Topics Concern  . None   Social History Narrative   Husband on disability   Pt also on disability   Past Surgical History:  Procedure Laterality Date  . ABDOMINAL HYSTERECTOMY    . APPENDECTOMY    . CHOLECYSTECTOMY    . COLONOSCOPY W/ BIOPSIES  2010   adenomatous polyp repeat 2015  . COLONOSCOPY WITH PROPOFOL N/A 03/13/2013   Procedure: COLONOSCOPY WITH PROPOFOL;  Surgeon: Lafayette Dragon, MD;  Location: [facility];  Service: Endoscopy;  Laterality: N/A;  . ESOPHAGOGASTRODUODENOSCOPY  2010   showed erosions  . LUMBAR FUSION  2011   L4-L5-S1 Dr. Marcial Pacas  . TONSILLECTOMY    . UPPER GASTROINTESTINAL ENDOSCOPY  2017   Past Medical History:  Diagnosis Date  . Anemia   . Arthritis   . Back pain, chronic   . Complication of anesthesia    manic episode  . Depression   . Diabetes mellitus type II   . GERD (gastroesophageal reflux disease)   . H/O hiatal hernia   .  HEADACHE, CHRONIC 04/21/2008  . HX OF GALLSTONE 04/21/2008   Qualifier: Diagnosis of  By: Julaine Hua CMA ([place]), Estill Bamberg    . Hyperlipidemia   . Hypertension   . Insomnia    03-08-13"states is in control"  . Irritable bowel syndrome   . Memory loss of unknown cause   . Panic attack as reaction to stress   . Seizures ([place])    Passes out frequently, but not seizures per pt  . Syncope    Pt states happens once per week approximately  . Tremor    BP 102/70 (BP Location: Left Arm, Patient Position: Sitting, Cuff Size: Normal)   Pulse 84   Resp 14   SpO2 97%   Opioid Risk Score:   Fall Risk Score:  `1  Depression screen PHQ 2/9  Depression screen [facility] 2/9 06/08/2016 03/07/2016 12/08/2015 11/10/2015 09/09/2015 06/16/2015 05/15/2015  Decreased Interest 0 0 0 0 0 0 0  Down,  Depressed, Hopeless 0 0 0 0 0 0 0  PHQ - 2 Score 0 0 0 0 0 0 0  Altered sleeping - - - - - - -  Tired, decreased energy - - - - - - -  Change in appetite - - - - - - -  Feeling bad or failure about yourself  - - - - - - -  Trouble concentrating - - - - - - -  Moving slowly or fidgety/restless - - - - - - -  Suicidal thoughts - - - - - - -  PHQ-9 Score - - - - - - -  Difficult doing work/chores - - - - - - -  Some recent data might be hidden    Review of Systems  Constitutional: Negative.   HENT: Negative.   Eyes: Negative.   Respiratory: Negative.   Cardiovascular: Negative.   Gastrointestinal: Negative.   Endocrine: Negative.   Genitourinary: Negative.   Musculoskeletal: Positive for arthralgias, back pain and myalgias.       Spasms  Skin: Negative.   Allergic/Immunologic: Negative.   Neurological: Negative.   Hematological: Negative.   Psychiatric/Behavioral: The patient is nervous/anxious.   All other systems reviewed and are negative.      Objective:   Physical Exam  Constitutional: She is oriented to person, place, and time. She appears well-developed and well-nourished.  HENT:  Head: Normocephalic and atraumatic.  Neck: Normal range of motion. Neck supple.  Cardiovascular: Normal rate and regular rhythm.   Pulmonary/Chest: Effort normal and breath sounds normal.  Musculoskeletal:  Normal Muscle Bulk and Muscle Testing Reveals: Upper Extremities: Full ROM and Muscle Strength 5/5 Lumbar Paraspinal Tenderness: L-3-L-5 Left Buttock Tenderness Lower Extremities: Full ROM and Muscle Strength 5/5 Arises from chair with ease Narrow Based Gait   Neurological: She is alert and oriented to person, place, and time.  Skin: Skin is warm and dry.  Psychiatric: She has a normal mood and affect.  Nursing note and vitals reviewed.         Assessment & Plan:  1. Lumbar postlaminectomy syndrome- status post L4-5 and L5-S1 fusion with chronic postoperative pain as well  as a chronic left L5 radiculopathy. 06/09/2016 Refilled: Oxycodone 7.5/325 mg one tablet every 8 hours #90. We will continue the opioid monitoring program, this consists of regular clinic visits, examinations, urine drug screen, pill counts as well as use of [place] Controlled Substance Reporting System. Continue with heat and exercise therapy. 2. Lumbar spondylosis above the level of the fusion:  Continue HEP as tolerated. Continue to Monitor.06/09/2016 3. Bilateral Greater Trochanteric Bursitis: S/P Bilateral Hip Injections on 03/17/2016, with good relief noted. Continue with heat, Ice therapy and HEP as tolerated. 06/09/2016 4. Muscle Spasms: Continue Robaxin as needed. 06/09/2016  20 minutes of face to face patient care time was spent during this visit. All questions were encouraged and answered.   F/U in 1 month.

## 2016-07-07 ENCOUNTER — Other Ambulatory Visit: Payer: Self-pay | Admitting: Registered Nurse

## 2016-07-19 ENCOUNTER — Encounter: Payer: BLUE CROSS/BLUE SHIELD | Attending: Physical Medicine & Rehabilitation | Admitting: Registered Nurse

## 2016-07-19 ENCOUNTER — Encounter: Payer: Self-pay | Admitting: Registered Nurse

## 2016-07-19 VITALS — BP 98/69 | HR 88

## 2016-07-19 DIAGNOSIS — M961 Postlaminectomy syndrome, not elsewhere classified: Secondary | ICD-10-CM

## 2016-07-19 DIAGNOSIS — M47816 Spondylosis without myelopathy or radiculopathy, lumbar region: Secondary | ICD-10-CM | POA: Insufficient documentation

## 2016-07-19 DIAGNOSIS — Z5181 Encounter for therapeutic drug level monitoring: Secondary | ICD-10-CM | POA: Diagnosis not present

## 2016-07-19 DIAGNOSIS — M7061 Trochanteric bursitis, right hip: Secondary | ICD-10-CM | POA: Diagnosis not present

## 2016-07-19 DIAGNOSIS — G894 Chronic pain syndrome: Secondary | ICD-10-CM | POA: Diagnosis not present

## 2016-07-19 DIAGNOSIS — Z79899 Other long term (current) drug therapy: Secondary | ICD-10-CM | POA: Diagnosis not present

## 2016-07-19 DIAGNOSIS — M5416 Radiculopathy, lumbar region: Secondary | ICD-10-CM

## 2016-07-19 DIAGNOSIS — M7062 Trochanteric bursitis, left hip: Secondary | ICD-10-CM

## 2016-07-19 MED ORDER — OXYCODONE-ACETAMINOPHEN 7.5-325 MG PO TABS: 1.0000 | ORAL_TABLET | Freq: Three times a day (TID) | ORAL | 0 refills | Status: DC | PRN

## 2016-07-19 NOTE — Progress Notes (Signed)
Subjective:    Patient ID: Christine Bradshaw, female    DOB: 1965-02-04, 51 y.o.   MRN: [id number]  HPI: Christine Bradshaw is a 51year old female who returns for follow up appointment for chronic pain and medication refill. She states her pain is located in her lower back mainly left side and bilateral hip pain. She rates her pain 5. Her current exercise regime iswalking.  Last UDS was on 04/28/2016, it was consistent.  Pain Inventory Average Pain 5 Pain Right Now 5 My pain is sharp and burning  In the last 24 hours, has pain interfered with the following? General activity 6 Relation with others 5 Enjoyment of life 5 What TIME of day is your pain at its worst? evening Sleep (in general) Good  Pain is worse with: walking, standing and some activites Pain improves with: rest, heat/ice, medication and injections Relief from Meds: 8  Mobility walk without assistance use a walker ability to climb steps?  yes do you drive?  no  Function I need assistance with the following:  meal prep, household duties and shopping  Neuro/Psych spasms depression anxiety  Prior Studies Any changes since last visit?  no  Physicians involved in your care Any changes since last visit?  no   Family History  Problem Relation Age of Onset  . Hyperlipidemia Father   . Hypertension Father   . Heart disease Father   . Colon polyps Neg Hx   . Colon cancer Neg Hx    Social History   Social History  . Marital status: Married    Spouse name: N/A  . Number of children: 2  . Years of education: N/A   Occupational History  . disability    Social History Main Topics  . Smoking status: Current Every Day Smoker    Types: Cigarettes  . Smokeless tobacco: Never Used     Comment: working on quitting.  Down to 3 cigs/day 06/08/16  . Alcohol use No  . Drug use: No  . Sexual activity: Yes    Partners: Male   Other Topics Concern  . None   Social History Narrative   Husband on  disability   Pt also on disability   Past Surgical History:  Procedure Laterality Date  . ABDOMINAL HYSTERECTOMY    . APPENDECTOMY    . CHOLECYSTECTOMY    . COLONOSCOPY W/ BIOPSIES  2010   adenomatous polyp repeat 2015  . COLONOSCOPY WITH PROPOFOL N/A 03/13/2013   Procedure: COLONOSCOPY WITH PROPOFOL;  Surgeon: Lafayette Dragon, MD;  Location: [facility];  Service: Endoscopy;  Laterality: N/A;  . ESOPHAGOGASTRODUODENOSCOPY  2010   showed erosions  . LUMBAR FUSION  2011   L4-L5-S1 Dr. Marcial Pacas  . TONSILLECTOMY    . UPPER GASTROINTESTINAL ENDOSCOPY  2017   Past Medical History:  Diagnosis Date  . Anemia   . Arthritis   . Back pain, chronic   . Complication of anesthesia    manic episode  . Depression   . Diabetes mellitus type II   . GERD (gastroesophageal reflux disease)   . H/O hiatal hernia   . HEADACHE, CHRONIC 04/21/2008  . HX OF GALLSTONE 04/21/2008   Qualifier: Diagnosis of  By: Julaine Hua CMA ([place]), Estill Bamberg    . Hyperlipidemia   . Hypertension   . Insomnia    03-08-13"states is in control"  . Irritable bowel syndrome   . Memory loss of unknown cause   . Panic attack as reaction to  stress   . Seizures ([place])    Passes out frequently, but not seizures per pt  . Syncope    Pt states happens once per week approximately  . Tremor    BP 98/69   Pulse 88   SpO2 96%   Opioid Risk Score:   Fall Risk Score:  `1  Depression screen PHQ 2/9  Depression screen [facility] 2/9 06/08/2016 03/07/2016 12/08/2015 11/10/2015 09/09/2015 06/16/2015 05/15/2015  Decreased Interest 0 0 0 0 0 0 0  Down, Depressed, Hopeless 0 0 0 0 0 0 0  PHQ - 2 Score 0 0 0 0 0 0 0  Altered sleeping - - - - - - -  Tired, decreased energy - - - - - - -  Change in appetite - - - - - - -  Feeling bad or failure about yourself  - - - - - - -  Trouble concentrating - - - - - - -  Moving slowly or fidgety/restless - - - - - - -  Suicidal thoughts - - - - - - -  PHQ-9 Score - - - - - - -  Difficult doing work/chores - - -  - - - -  Some recent data might be hidden     Review of Systems  Constitutional: Negative.   HENT: Negative.   Eyes: Negative.   Respiratory: Negative.   Cardiovascular: Negative.   Gastrointestinal: Negative.   Endocrine: Negative.   Genitourinary: Negative.   Musculoskeletal: Negative.   Skin: Negative.   Allergic/Immunologic: Negative.   Neurological: Negative.   Hematological: Negative.   All other systems reviewed and are negative.      Objective:   Physical Exam  Constitutional: She is oriented to person, place, and time. She appears well-developed and well-nourished.  HENT:  Head: Normocephalic and atraumatic.  Neck: Normal range of motion. Neck supple.  Cardiovascular: Normal rate and regular rhythm.   Pulmonary/Chest: Effort normal and breath sounds normal.  Musculoskeletal:  Normal Muscle Bulk and Muscle Testing Reveals: Upper Extremities: Full ROM and Muscle Strength 5/5 Lumbar Paraspinal Tenderness: L-3-L-5 Lower Extremities: Full ROM and Muscle Strength 5/5 Arises from chair with ease Narrow Based Gait   Neurological: She is alert and oriented to person, place, and time.  Skin: Skin is warm and dry.  Psychiatric: She has a normal mood and affect.  Nursing note and vitals reviewed.         Assessment & Plan:  1. Lumbar postlaminectomy syndrome- status post L4-5 and L5-S1 fusion with chronic postoperative pain as well as a chronic left L5 radiculopathy. 07/19/2016 Refilled: Oxycodone 7.5/325 mg one tablet every 8 hours #90. We will continue the opioid monitoring program, this consists of regular clinic visits, examinations, urine drug screen, pill counts as well as use of [place] Controlled Substance Reporting System. Continue with heat and exercise therapy. 2. Lumbar spondylosis above the level of the fusion:  Continue HEP as tolerated. Continue to Monitor.07/19/2016 3. Bilateral Greater Trochanteric Bursitis: S/P Bilateral Hip Injections on  03/17/2016, with good relief noted. Continue with heat, Ice therapy and HEP as tolerated. 07/19/2016 4. Muscle Spasms: Continue Robaxin as needed. 07/19/2016  20 minutes of face to face patient care time was spent during this visit. All questions were encouraged and answered.    F/U in 1 month.

## 2016-08-26 ENCOUNTER — Other Ambulatory Visit: Payer: Self-pay | Admitting: Family Medicine

## 2016-08-26 DIAGNOSIS — Z1231 Encounter for screening mammogram for malignant neoplasm of breast: Secondary | ICD-10-CM

## 2016-08-30 ENCOUNTER — Encounter: Payer: Self-pay | Admitting: Registered Nurse

## 2016-08-30 ENCOUNTER — Encounter: Payer: BLUE CROSS/BLUE SHIELD | Attending: Physical Medicine & Rehabilitation | Admitting: Registered Nurse

## 2016-08-30 VITALS — BP 105/73 | HR 88 | Resp 14

## 2016-08-30 DIAGNOSIS — M7062 Trochanteric bursitis, left hip: Secondary | ICD-10-CM

## 2016-08-30 DIAGNOSIS — M961 Postlaminectomy syndrome, not elsewhere classified: Secondary | ICD-10-CM | POA: Diagnosis not present

## 2016-08-30 DIAGNOSIS — Z5181 Encounter for therapeutic drug level monitoring: Secondary | ICD-10-CM

## 2016-08-30 DIAGNOSIS — Z79899 Other long term (current) drug therapy: Secondary | ICD-10-CM

## 2016-08-30 DIAGNOSIS — M7061 Trochanteric bursitis, right hip: Secondary | ICD-10-CM | POA: Diagnosis not present

## 2016-08-30 DIAGNOSIS — M62838 Other muscle spasm: Secondary | ICD-10-CM | POA: Diagnosis not present

## 2016-08-30 DIAGNOSIS — M47816 Spondylosis without myelopathy or radiculopathy, lumbar region: Secondary | ICD-10-CM | POA: Diagnosis not present

## 2016-08-30 DIAGNOSIS — G894 Chronic pain syndrome: Secondary | ICD-10-CM | POA: Diagnosis not present

## 2016-08-30 DIAGNOSIS — M5416 Radiculopathy, lumbar region: Secondary | ICD-10-CM

## 2016-08-30 MED ORDER — OXYCODONE-ACETAMINOPHEN 7.5-325 MG PO TABS: 1.0000 | ORAL_TABLET | Freq: Three times a day (TID) | ORAL | 0 refills | Status: DC | PRN

## 2016-08-30 MED ORDER — METHOCARBAMOL 500 MG PO TABS: ORAL_TABLET | ORAL | 2 refills | Status: DC

## 2016-08-30 NOTE — Progress Notes (Signed)
Subjective:    Patient ID: Christine Bradshaw, female    DOB: 08-17-1965, 51 y.o.   MRN: [id number]  HPI: Christine Bradshaw is a 51year old female who returns for follow up appointment for chronic pain and medication refill. She states her pain is located in her lower back  Radiating into her buttocks,  bilateral hips L>R and left lower extremity.She rates her pain 7. Her current exercise regime iswalking.  Last UDS was on 04/28/2016, it was consistent.   Pain Inventory Average Pain 6 Pain Right Now 7 My pain is constant, sharp, burning and stabbing  In the last 24 hours, has pain interfered with the following? General activity 6 Relation with others 5 Enjoyment of life 7 What TIME of day is your pain at its worst? morning Sleep (in general) Good  Pain is worse with: walking, standing and some activites Pain improves with: rest, heat/ice, medication and injections Relief from Meds: 6  Mobility walk without assistance walk with assistance use a walker how many minutes can you walk? 5 ability to climb steps?  yes do you drive?  no use a wheelchair transfers alone  Function I need assistance with the following:  household duties and shopping  Neuro/Psych trouble walking spasms depression anxiety  Prior Studies Any changes since last visit?  no  Physicians involved in your care Any changes since last visit?  no   Family History  Problem Relation Age of Onset  . Hyperlipidemia Father   . Hypertension Father   . Heart disease Father   . Colon polyps Neg Hx   . Colon cancer Neg Hx    Social History   Social History  . Marital status: Married    Spouse name: N/A  . Number of children: 2  . Years of education: N/A   Occupational History  . disability    Social History Main Topics  . Smoking status: Current Every Day Smoker    Types: Cigarettes  . Smokeless tobacco: Never Used     Comment: working on quitting.  Down to 3 cigs/day 06/08/16  .  Alcohol use No  . Drug use: No  . Sexual activity: Yes    Partners: Male   Other Topics Concern  . None   Social History Narrative   Husband on disability   Pt also on disability   Past Surgical History:  Procedure Laterality Date  . ABDOMINAL HYSTERECTOMY    . APPENDECTOMY    . CHOLECYSTECTOMY    . COLONOSCOPY W/ BIOPSIES  2010   adenomatous polyp repeat 2015  . COLONOSCOPY WITH PROPOFOL N/A 03/13/2013   Procedure: COLONOSCOPY WITH PROPOFOL;  Surgeon: Lafayette Dragon, MD;  Location: [facility];  Service: Endoscopy;  Laterality: N/A;  . ESOPHAGOGASTRODUODENOSCOPY  2010   showed erosions  . LUMBAR FUSION  2011   L4-L5-S1 Dr. Marcial Pacas  . TONSILLECTOMY    . UPPER GASTROINTESTINAL ENDOSCOPY  2017   Past Medical History:  Diagnosis Date  . Anemia   . Arthritis   . Back pain, chronic   . Complication of anesthesia    manic episode  . Depression   . Diabetes mellitus type II   . GERD (gastroesophageal reflux disease)   . H/O hiatal hernia   . HEADACHE, CHRONIC 04/21/2008  . HX OF GALLSTONE 04/21/2008   Qualifier: Diagnosis of  By: Julaine Hua CMA ([place]), Estill Bamberg    . Hyperlipidemia   . Hypertension   . Insomnia    03-08-13"states is  in control"  . Irritable bowel syndrome   . Memory loss of unknown cause   . Panic attack as reaction to stress   . Seizures ([place])    Passes out frequently, but not seizures per pt  . Syncope    Pt states happens once per week approximately  . Tremor    BP 105/73 (BP Location: Left Arm, Patient Position: Sitting, Cuff Size: Normal)   Pulse 88   Resp 14   SpO2 95%   Opioid Risk Score:   Fall Risk Score:  `1  Depression screen PHQ 2/9  Depression screen [facility] 2/9 06/08/2016 03/07/2016 12/08/2015 11/10/2015 09/09/2015 06/16/2015 05/15/2015  Decreased Interest 0 0 0 0 0 0 0  Down, Depressed, Hopeless 0 0 0 0 0 0 0  PHQ - 2 Score 0 0 0 0 0 0 0  Altered sleeping - - - - - - -  Tired, decreased energy - - - - - - -  Change in appetite - - - - - - -    Feeling bad or failure about yourself  - - - - - - -  Trouble concentrating - - - - - - -  Moving slowly or fidgety/restless - - - - - - -  Suicidal thoughts - - - - - - -  PHQ-9 Score - - - - - - -  Difficult doing work/chores - - - - - - -  Some recent data might be hidden    Review of Systems  Constitutional: Negative.   HENT: Negative.   Eyes: Negative.   Respiratory: Negative.   Cardiovascular: Negative.   Gastrointestinal: Negative.   Endocrine: Negative.   Genitourinary: Negative.   Musculoskeletal: Positive for arthralgias, back pain and gait problem.       Spasms  Skin: Negative.   Allergic/Immunologic: Negative.   Neurological: Positive for dizziness.  Hematological: Negative.   Psychiatric/Behavioral: Negative.        Objective:   Physical Exam  Constitutional: She is oriented to person, place, and time. She appears well-developed and well-nourished.  HENT:  Head: Normocephalic and atraumatic.  Neck: Normal range of motion. Neck supple.  Cardiovascular: Normal rate and regular rhythm.   Pulmonary/Chest: Effort normal and breath sounds normal.  Musculoskeletal:  Normal Muscle Bulk and Muscle Testing Reveals: Upper Extremities: Full ROM and Muscle Strength 5/5 Lumbar Paraspinal Tenderness: L-3-L-5 Mainly Left Side Bilateral Greater Trochanter Tenderness Lower Extremities: Full ROM and Muscle Strength 5/5 Arises from Chair with ease Narrow Based Gait  Neurological: She is alert and oriented to person, place, and time.  Skin: Skin is warm and dry.  Psychiatric: She has a normal mood and affect.  Nursing note and vitals reviewed.         Assessment & Plan:  1. Lumbar postlaminectomy syndrome- status post L4-5 and L5-S1 fusion with chronic postoperative pain as well as a chronic left L5 radiculopathy. 08/30/2016 Refilled: Oxycodone 7.5/325 mg one tablet every 8 hours #90. We will continue the opioid monitoring program, this consists of regular clinic  visits, examinations, urine drug screen, pill counts as well as use of [place] Controlled Substance Reporting System. Continue with heat and exercise therapy. 2. Lumbar spondylosis above the level of the fusion: Continue HEP as tolerated. Continue to Monitor.08/30/2016 3. Bilateral Greater Trochanteric Bursitis: S/P Bilateral Hip Injections on 03/17/2016,with good relief noted. Continue with heat, Ice therapy and HEP as tolerated. 08/30/2016 4. Muscle Spasms: Continue Robaxin as needed. 08/30/2016  20 minutes of  face to face patient care time was spent during this visit. All questions were encouraged and answered.  F/U in 1 month.

## 2016-09-01 ENCOUNTER — Telehealth: Payer: Self-pay | Admitting: Registered Nurse

## 2016-09-01 NOTE — Telephone Encounter (Signed)
On 09/01/2016 the  Christine Bradshaw was reviewed no conflict was seen on the [place] with multiple prescribers.  [name] has a signed narcotic contract with our office. If there were any discrepancies this would have been reported to her physician.

## 2016-09-14 ENCOUNTER — Ambulatory Visit
Admission: RE | Admit: 2016-09-14 | Discharge: 2016-09-14 | Disposition: A | Discharge status: Home / Self Care | Payer: BLUE CROSS/BLUE SHIELD | Source: Ambulatory Visit | Attending: Obstetrics and Gynecology | Admitting: Obstetrics and Gynecology

## 2016-09-14 DIAGNOSIS — Z1231 Encounter for screening mammogram for malignant neoplasm of breast: Secondary | ICD-10-CM

## 2016-09-20 NOTE — Progress Notes (Signed)
   Subjective:   Patient ID: Christine Bradshaw    DOB: 06/07/1965, 51 y.o. female   MRN: [id number]  Christine Bradshaw is a 51 y.o. female with a history of HTN, type 2 diabetes here to meet new PCP and follow up on blood pressure and diabetes.  Needs Flu shot Needs Pneumococcal vaccine  #Diabetes A1c 4.9 6/6, recheck today 4.7 Victoza 1.8mg  qd Metformin 1000mg  lantus 22u every morning Due for foot exam (05/2015) and eye exam (12/2014)  #HTN Quinapril 10mg  Lasix 40mg  - patient reports she took 80mg  for two days last week because her legs were swelling.  Review of Systems:  Per HPI.   [place]: reviewed. Smoking status reviewed. Medications reviewed.  Objective:   BP 98/62   Pulse (!) 101   Temp 98.4 F (36.9 C) (Oral)   Wt 209 lb (94.8 kg)   SpO2 97%   BMI 35.87 kg/m  Vitals and nursing note reviewed.  General: well nourished, well developed, in no acute distress with non-toxic appearance HEENT: normocephalic, atraumatic, moist mucous membranes CV: regular rate and rhythm without murmurs, rubs, or gallops, no lower extremity edema Lungs: clear to auscultation bilaterally with normal work of breathing Skin: warm, dry, no rashes  Extremities: warm and well perfused, normal tone, dorsal pedal pulses appreciated MSK: Full ROM, strength intact, gait normal Neuro: Alert and oriented, speech normal  Assessment & Plan:   Essential hypertension 98/62 today. Currently on Quinapril 10mg , Lasix 40mg . Pt reports she took 80 mg twice last week due to leg swelling. No LE edema on today's exam. - Decrease quinapril to 5mg  - Counseled against doubling medication without direction. - Keep BP diary - f/u in one month   Diabetes mellitus, type II, insulin dependent (HCC) A1c today 4.7. Will decrease Lantus to 15u. Patient was initially hesitant but agreeable with counseling and warned on dangers of low blood glucose.  - Decrease Lantus to 15u. - Continue Metformin 1000mg , Victoza  1.8 - Follow up in one month - Keep record of daily CBG  Orders Placed This Encounter  Procedures  . Pneumococcal conjugate vaccine 13-valent  . Flu Vaccine QUAD 36+ mos IM  . HgB A1c   Meds ordered this encounter  Medications  . quinapril (ACCUPRIL) 10 MG tablet    Sig: Take 0.5 tablets (5 mg total) by mouth at bedtime.    Dispense:  45 tablet    Refill:  0  . Insulin Glargine (LANTUS) 100 UNIT/ML Solostar Pen    Sig: Inject 15 Units into the skin daily at 10 pm.    Dispense:  15 mL    Refill:  [place], DO PGY-1, [facility] 09/22/2016 2:45 PM

## 2016-09-22 ENCOUNTER — Ambulatory Visit (INDEPENDENT_AMBULATORY_CARE_PROVIDER_SITE_OTHER): Payer: BLUE CROSS/BLUE SHIELD | Admitting: Family Medicine

## 2016-09-22 ENCOUNTER — Encounter: Payer: Self-pay | Admitting: Family Medicine

## 2016-09-22 VITALS — BP 98/62 | HR 101 | Temp 98.4°F | Wt 209.0 lb

## 2016-09-22 DIAGNOSIS — I10 Essential (primary) hypertension: Secondary | ICD-10-CM | POA: Diagnosis not present

## 2016-09-22 DIAGNOSIS — Z794 Long term (current) use of insulin: Secondary | ICD-10-CM

## 2016-09-22 DIAGNOSIS — E119 Type 2 diabetes mellitus without complications: Secondary | ICD-10-CM

## 2016-09-22 DIAGNOSIS — Z23 Encounter for immunization: Secondary | ICD-10-CM | POA: Diagnosis not present

## 2016-09-22 LAB — POCT GLYCOSYLATED HEMOGLOBIN (HGB A1C): Hemoglobin A1C: 4.7

## 2016-09-22 MED ORDER — QUINAPRIL HCL 10 MG PO TABS: 5.0000 mg | ORAL_TABLET | Freq: Every day | ORAL | 0 refills | Status: DC

## 2016-09-22 MED ORDER — INSULIN GLARGINE 100 UNIT/ML SOLOSTAR PEN: 15.0000 [IU] | PEN_INJECTOR | Freq: Every day | SUBCUTANEOUS | 11 refills | Status: DC

## 2016-09-22 NOTE — Patient Instructions (Signed)
It was great to see you!  For your diabetes,  - We are decreasing your Lantus to 15units - Document your blood sugars every day  For your blood pressure, - We are decreasing your Accupril to 5mg  - Document your blood pressure readings every day.  Please make an appointment to follow up on your blood pressure and diabetes in 1 month. Congratulations on working hard to eat right and lose weight!  You're doing a great job!!  Take care and seek immediate care sooner if you develop any concerns.   Be well, Rory Percy, DO [place]

## 2016-09-22 NOTE — Assessment & Plan Note (Signed)
98/62 today. Currently on Quinapril 10mg , Lasix 40mg . Pt reports she took 80 mg twice last week due to leg swelling. No LE edema on today's exam. - Decrease quinapril to 5mg  - Counseled against doubling medication without direction. - Keep BP diary - f/u in one month

## 2016-09-22 NOTE — Assessment & Plan Note (Signed)
A1c today 4.7. Will decrease Lantus to 15u. Patient was initially hesitant but agreeable with counseling and warned on dangers of low blood glucose.  - Decrease Lantus to 15u. - Continue Metformin 1000mg , Victoza 1.8 - Follow up in one month - Keep record of daily CBG

## 2016-09-27 ENCOUNTER — Encounter: Payer: BLUE CROSS/BLUE SHIELD | Attending: Physical Medicine & Rehabilitation

## 2016-09-27 ENCOUNTER — Encounter: Payer: Self-pay | Admitting: Physical Medicine & Rehabilitation

## 2016-09-27 ENCOUNTER — Ambulatory Visit (HOSPITAL_BASED_OUTPATIENT_CLINIC_OR_DEPARTMENT_OTHER): Payer: BLUE CROSS/BLUE SHIELD | Admitting: Physical Medicine & Rehabilitation

## 2016-09-27 VITALS — BP 106/76 | HR 92 | Resp 14

## 2016-09-27 DIAGNOSIS — M7062 Trochanteric bursitis, left hip: Secondary | ICD-10-CM

## 2016-09-27 DIAGNOSIS — M7061 Trochanteric bursitis, right hip: Secondary | ICD-10-CM | POA: Diagnosis not present

## 2016-09-27 DIAGNOSIS — M47816 Spondylosis without myelopathy or radiculopathy, lumbar region: Secondary | ICD-10-CM | POA: Diagnosis not present

## 2016-09-27 MED ORDER — OXYCODONE HCL 5 MG PO TABS: 5.0000 mg | ORAL_TABLET | Freq: Four times a day (QID) | ORAL | 0 refills | Status: DC | PRN

## 2016-09-27 NOTE — Progress Notes (Signed)
Trochanteric bursa injection Withultrasound guidance  Indication Trochanteric bursitis. Exam has tenderness over the greater trochanter of the hip. Pain has not responded to conservative care such as exercise therapy and oral medications. Pain interferes with sleep or with mobility Informed consent was obtained after describing risks and benefits of the procedure with the patient these include bleeding bruising and infection. Patient has signed written consent form. Patient placed in a lateral decubitus position with the affected hip superior.Greater trochanter was scanned and identified,  marked and prepped with Betadine and entered with a needle to bone contact under direct [place] visualization. Needle slightly withdrawn then 6mg  of betamethasone with 4 cc 1% lidocaine were injected. Patient tolerated procedure well. Post procedure instructions given.  Continue weaning oxycodone . This month go down to 5 mg every 8 hours

## 2016-09-27 NOTE — Patient Instructions (Signed)
Decrease the dose of oxycodone 5 mg this month, please let Christine Bradshaw know next month how this is doing

## 2016-10-24 ENCOUNTER — Encounter: Payer: BLUE CROSS/BLUE SHIELD | Attending: Physical Medicine & Rehabilitation | Admitting: Registered Nurse

## 2016-10-24 ENCOUNTER — Telehealth: Payer: Self-pay | Admitting: Registered Nurse

## 2016-10-24 ENCOUNTER — Encounter: Payer: Self-pay | Admitting: Registered Nurse

## 2016-10-24 VITALS — BP 109/78 | HR 92

## 2016-10-24 DIAGNOSIS — M961 Postlaminectomy syndrome, not elsewhere classified: Secondary | ICD-10-CM

## 2016-10-24 DIAGNOSIS — M47816 Spondylosis without myelopathy or radiculopathy, lumbar region: Secondary | ICD-10-CM

## 2016-10-24 DIAGNOSIS — G894 Chronic pain syndrome: Secondary | ICD-10-CM

## 2016-10-24 DIAGNOSIS — Z5181 Encounter for therapeutic drug level monitoring: Secondary | ICD-10-CM | POA: Diagnosis not present

## 2016-10-24 DIAGNOSIS — Z79899 Other long term (current) drug therapy: Secondary | ICD-10-CM

## 2016-10-24 MED ORDER — OXYCODONE HCL 5 MG PO TABS: 5.0000 mg | ORAL_TABLET | Freq: Four times a day (QID) | ORAL | 0 refills | Status: DC | PRN

## 2016-10-24 NOTE — Progress Notes (Signed)
Subjective:    Patient ID: Christine Bradshaw, female    DOB: 01/29/1965, 51 y.o.   MRN: [id number]  HPI: [name] is a 51year old female who returns for follow up appointment for chronic pain and medication refill. She states her pain is located in her lower back. She rates her pain 5. Her current exercise regime is walking and using treadmill 5 minutes a day three times a day.  Christine Bradshaw Morphine equivalent is 30.68 MME. She is also prescribed Klonopin  by Dr Huel Cote, prescription written by Beryle Lathe NP. We have discussed the black box warning of using opioids and benzodiazepines. I highlighted the dangers of using these drugs together and discussed the adverse events including respiratory suppression, overdose, cognitive impairment and importance of  compliance with current regimen. She verbalizes understanding, we will continue to monitor and adjust as indicated.   She is being closely monitored and under the care of her psychiatrist Dr Christine Bradshaw.   S/P Bilateral Trochanteric Bursa Injection with relief noted.   Last UDS was on 04/28/2016, it was consistent. Oral Swab Performed today.    Pain Inventory Average Pain 6 Pain Right Now 5 My pain is sharp, burning and aching  In the last 24 hours, has pain interfered with the following? General activity 7 Relation with others 4 Enjoyment of life 7 What TIME of day is your pain at its worst? morning Sleep (in general) Good  Pain is worse with: walking, standing and some activites Pain improves with: heat/ice, medication and injections Relief from Meds: 6  Mobility walk without assistance walk with assistance use a walker how many minutes can you walk? 5 ability to climb steps?  yes do you drive?  no use a wheelchair transfers alone  Function I need assistance with the following:  household duties and shopping  Neuro/Psych trouble walking spasms depression anxiety  Prior Studies Any  changes since last visit?  no  Physicians involved in your care Any changes since last visit?  no   Family History  Problem Relation Age of Onset  . Hyperlipidemia Father   . Hypertension Father   . Heart disease Father   . Colon polyps Neg Hx   . Colon cancer Neg Hx    Social History   Social History  . Marital status: Married    Spouse name: N/A  . Number of children: 2  . Years of education: N/A   Occupational History  . disability    Social History Main Topics  . Smoking status: Current Every Day Smoker    Types: Cigarettes  . Smokeless tobacco: Never Used     Comment: working on quitting.  Down to 3 cigs/day 06/08/16  . Alcohol use No  . Drug use: No  . Sexual activity: Yes    Partners: Male   Other Topics Concern  . None   Social History Narrative   Husband on disability   Pt also on disability   Past Surgical History:  Procedure Laterality Date  . ABDOMINAL HYSTERECTOMY    . APPENDECTOMY    . CHOLECYSTECTOMY    . COLONOSCOPY W/ BIOPSIES  2010   adenomatous polyp repeat 2015  . COLONOSCOPY WITH PROPOFOL N/A 03/13/2013   Procedure: COLONOSCOPY WITH PROPOFOL;  Surgeon: Lafayette Dragon, MD;  Location: [facility];  Service: Endoscopy;  Laterality: N/A;  . ESOPHAGOGASTRODUODENOSCOPY  2010   showed erosions  . LUMBAR FUSION  2011   L4-L5-S1 Dr. Marcial Pacas  .  TONSILLECTOMY    . UPPER GASTROINTESTINAL ENDOSCOPY  2017   Past Medical History:  Diagnosis Date  . Anemia   . Arthritis   . Back pain, chronic   . Complication of anesthesia    manic episode  . Depression   . Diabetes mellitus type II   . GERD (gastroesophageal reflux disease)   . H/O hiatal hernia   . HEADACHE, CHRONIC 04/21/2008  . HX OF GALLSTONE 04/21/2008   Qualifier: Diagnosis of  By: Julaine Hua CMA ([employer]), Estill Bamberg    . Hyperlipidemia   . Hypertension   . Insomnia    03-08-13"states is in control"  . Irritable bowel syndrome   . Memory loss of unknown cause   . Panic attack as reaction to  stress   . Seizures ([place])    Passes out frequently, but not seizures per pt  . Syncope    Pt states happens once per week approximately  . Tremor    BP 109/78   Pulse 92   SpO2 97%   Opioid Risk Score:   Fall Risk Score:  `1  Depression screen PHQ 2/9  Depression screen [facility] 2/9 09/22/2016 06/08/2016 03/07/2016 12/08/2015 11/10/2015 09/09/2015 06/16/2015  Decreased Interest 0 0 0 0 0 0 0  Down, Depressed, Hopeless 0 0 0 0 0 0 0  PHQ - 2 Score 0 0 0 0 0 0 0  Altered sleeping - - - - - - -  Tired, decreased energy - - - - - - -  Change in appetite - - - - - - -  Feeling bad or failure about yourself  - - - - - - -  Trouble concentrating - - - - - - -  Moving slowly or fidgety/restless - - - - - - -  Suicidal thoughts - - - - - - -  PHQ-9 Score - - - - - - -  Difficult doing work/chores - - - - - - -  Some recent data might be hidden    Review of Systems  Constitutional: Negative.   HENT: Negative.   Eyes: Negative.   Respiratory: Negative.   Cardiovascular: Negative.   Gastrointestinal: Negative.   Endocrine: Negative.   Genitourinary: Negative.   Musculoskeletal: Positive for arthralgias, back pain and gait problem.       Spasms  Skin: Negative.   Allergic/Immunologic: Negative.   Hematological: Negative.   Psychiatric/Behavioral: Negative.        Objective:   Physical Exam  Constitutional: She is oriented to person, place, and time. She appears well-developed and well-nourished.  HENT:  Head: Normocephalic and atraumatic.  Neck: Normal range of motion. Neck supple.  Cardiovascular: Normal rate and regular rhythm.   Pulmonary/Chest: Effort normal and breath sounds normal.  Musculoskeletal:  Normal Muscle Bulk and Muscle Testing Reveals: Upper Extremities: Full ROM and Muscle Strength 5/5 Lumbar Paraspinal Tenderness: L-3-L-5  Lower Extremities: Full ROM and Muscle Strength 5/5 Arises from Chair with ease Narrow Based Gait  Neurological: She is alert and oriented  to person, place, and time.  Skin: Skin is warm and dry.  Psychiatric: She has a normal mood and affect.  Nursing note and vitals reviewed.         Assessment & Plan:  1. Lumbar postlaminectomy syndrome- status post L4-5 and L5-S1 fusion with chronic postoperative pain as well as a chronic left L5 radiculopathy. 10/24/2016 Refilled: Oxycodone 5 mg one tablet every 8 hours #90. We will continue the opioid monitoring program, this  consists of regular clinic visits, examinations, urine drug screen, pill counts as well as use of [place] Controlled Substance Reporting System. Continue with heat and exercise therapy. 2. Lumbar spondylosis above the level of the fusion: Continue HEP as tolerated. Continue to Monitor.10/24/2016 3. Bilateral Greater Trochanteric Bursitis: S/P Bilateral Hip Injections with good relief noted. Continue with heat, Ice therapy and HEP as tolerated. 10/24/2016 4. Muscle Spasms: Continue Robaxin as needed. 10/24/2016  20 minutes of face to face patient care time was spent during this visit. All questions were encouraged and answered.  F/U in 1 month.

## 2016-10-24 NOTE — Telephone Encounter (Signed)
On 10/24/2016 the  [place] was reviewed no conflict was seen on the [place] with multiple prescribers. Christine Bradshaw  has a signed narcotic contract with our office. If there were any discrepancies this would have been reported to her physician.

## 2016-10-25 ENCOUNTER — Encounter: Payer: BLUE CROSS/BLUE SHIELD | Admitting: Registered Nurse

## 2016-10-27 ENCOUNTER — Ambulatory Visit: Payer: BLUE CROSS/BLUE SHIELD | Admitting: Family Medicine

## 2016-10-30 LAB — DRUG TOX MONITOR 1 W/CONF, ORAL FLD
Amphetamines: NEGATIVE ng/mL (ref <10)
Barbiturates: NEGATIVE ng/mL (ref <10)
Benzodiazepines: NEGATIVE ng/mL (ref <0.50)
Buprenorphine: NEGATIVE ng/mL (ref <0.10)
CODEINE: NEGATIVE ng/mL (ref <2.5)
Cocaine: NEGATIVE ng/mL (ref <5.0)
Cotinine: 13 ng/mL — ABNORMAL HIGH (ref <5.0)
DIHYDROCODEINE: NEGATIVE ng/mL (ref <2.5)
Fentanyl: NEGATIVE ng/mL (ref <0.10)
HEROIN METABOLITE: NEGATIVE ng/mL (ref <1.0)
HYDROMORPHONE: NEGATIVE ng/mL (ref <2.5)
Hydrocodone: NEGATIVE ng/mL (ref <2.5)
MARIJUANA: NEGATIVE ng/mL (ref <2.5)
MDMA: NEGATIVE ng/mL (ref <10)
METHADONE: NEGATIVE ng/mL (ref <5.0)
Meprobamate: NEGATIVE ng/mL (ref <2.5)
Morphine: NEGATIVE ng/mL (ref <2.5)
NICOTINE METABOLITE: POSITIVE ng/mL — AB (ref <5.0)
NOROXYCODONE: 6.5 ng/mL — AB (ref <2.5)
Norhydrocodone: NEGATIVE ng/mL (ref <2.5)
OPIATES: POSITIVE ng/mL — AB (ref <2.5)
OXYCODONE: 34.3 ng/mL — AB (ref <2.5)
Oxymorphone: NEGATIVE ng/mL (ref <2.5)
PHENCYCLIDINE: NEGATIVE ng/mL (ref <10)
TAPENTADOL: NEGATIVE ng/mL (ref <5.0)
TRAMADOL: NEGATIVE ng/mL (ref <5.0)
Zolpidem: NEGATIVE ng/mL (ref <5.0)

## 2016-10-30 LAB — DRUG TOX ALC METAB W/CON, ORAL FLD: Alcohol Metabolite: NEGATIVE ng/mL (ref <25)

## 2016-11-03 ENCOUNTER — Telehealth: Payer: Self-pay | Admitting: *Deleted

## 2016-11-03 NOTE — Telephone Encounter (Signed)
Oral swab drug screen was consistent for prescribed medications.  ?

## 2016-11-04 ENCOUNTER — Ambulatory Visit: Payer: BLUE CROSS/BLUE SHIELD | Admitting: Family Medicine

## 2016-11-06 NOTE — Progress Notes (Signed)
   Subjective:   Patient ID: Christine Bradshaw    DOB: 1965-12-26, 51 y.o. female   MRN: [id number]  Christine Bradshaw is a 51 y.o. female with a history of HTN, diabetes here for   Hypertension: - Medications: Quinapril 5mg , Lasix 40mg  - Compliance: yes - Checking BP at home: yes, 90-110s SBP - Denies any SOB, CP, vision changes, medication SEs, or symptoms of hypotension. Occasional LE edema - Exercise: treadmill, walking. Not lately due to back pain  - has been keeping BP diary per last visit recommendations, at last visit had doubled Lasix dose without direction.  Diabetes, Type 2 - Last A1c 4.7 09/22/16 - Medications: Lantus 15u, Metformin 1000mg , Victoza 1.8mg  - Compliance: yes - Checking BG at home: yes, 60-110 - Exercise: see above - Eye exam: due, has appointment next month - Foot exam: 05/2015, due - Denies symptoms of hypoglycemia, polyuria, polydipsia, numbness in extremities, foot ulcers/trauma  Acid Reflux - currently on omeprazole 40mg , and has been on same dose for years, previously well controlled and recently has increase in "stomach burning, getting indigestion." Wants to go up on dose. - has hiatal hernia  - not worse at any particular time of day or exacerbated by positional changes - no foods or drinks make it worse - feels it every day, no relation to when she takes omeprazole - no recent travel lately - tums makes it go away, takes 2 at a time and it gets better. - in between meals is worse. 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   [place]: reviewed. Smoking status reviewed. Medications reviewed.  Objective:   BP 110/70   Pulse (!) 101   Temp 98.1 F (36.7 C) (Oral)   Ht 5\' 4"  (1.626 m)   Wt 209 lb (94.8 kg)   SpO2 97%   BMI 35.87 kg/m  Vitals and nursing note reviewed.  General: well nourished, well developed, in no acute distress with non-toxic appearance CV: regular rate  and rhythm without murmurs, rubs, or gallops, no lower extremity edema Lungs: clear to auscultation bilaterally with normal work of breathing Skin: warm, dry, no rashes or lesions Extremities: warm and well perfused, normal tone MSK: ROM grossly intact, strength intact, gait normal Neuro: Alert and oriented, speech normal  Assessment & Plan:   Essential hypertension On Quinapril 5mg , Lasix 40mg . SBP 90-110s. Denies hypotensive symptoms.  - Decrease Lasix to 20mg  - Keep BP diary - F/u in one month  Diabetes mellitus, type II, insulin dependent (HCC) On Lantus 15u, Metformin 1000mg , Victoza 1.8mg  A1c 09/22/16 4.7. CBGs monitored at home 63-110. Denies hypoglycemic symptoms. - Discontinue Lantus  - Continue Metformin and Victoza - check CBGs - follow up in one month  Gastroesophageal reflux disease On Omeprazole 40mg  daily for years with previously good control. H/o hiatal hernia. Recent worsening of heartburn in past few months relieved by Tums. Endoscopy last year notable for small bowel AVMs - continue Tums - H pylori POCT  Orders Placed This Encounter  Procedures  . H. pylori Screen     Rory Percy, DO PGY-1, [facility] 11/07/2016 3:27 PM

## 2016-11-07 ENCOUNTER — Other Ambulatory Visit: Payer: Self-pay | Admitting: Family Medicine

## 2016-11-07 ENCOUNTER — Encounter: Payer: Self-pay | Admitting: Family Medicine

## 2016-11-07 ENCOUNTER — Ambulatory Visit (INDEPENDENT_AMBULATORY_CARE_PROVIDER_SITE_OTHER): Payer: BLUE CROSS/BLUE SHIELD | Admitting: Family Medicine

## 2016-11-07 VITALS — BP 110/70 | HR 101 | Temp 98.1°F | Ht 64.0 in | Wt 209.0 lb

## 2016-11-07 DIAGNOSIS — I10 Essential (primary) hypertension: Secondary | ICD-10-CM | POA: Diagnosis not present

## 2016-11-07 DIAGNOSIS — E119 Type 2 diabetes mellitus without complications: Secondary | ICD-10-CM | POA: Diagnosis not present

## 2016-11-07 DIAGNOSIS — K219 Gastro-esophageal reflux disease without esophagitis: Secondary | ICD-10-CM | POA: Diagnosis not present

## 2016-11-07 DIAGNOSIS — Z794 Long term (current) use of insulin: Secondary | ICD-10-CM | POA: Diagnosis not present

## 2016-11-07 LAB — POCT H PYLORI SCREEN: H Pylori Screen, POC: NEGATIVE

## 2016-11-07 MED ORDER — FUROSEMIDE 20 MG PO TABS
20.0000 mg | ORAL_TABLET | Freq: Once | ORAL | Status: DC
Start: 2016-11-07 — End: 2016-11-07

## 2016-11-07 NOTE — Assessment & Plan Note (Signed)
On Lantus 15u, Metformin 1000mg , Victoza 1.8mg  A1c 09/22/16 4.7. CBGs monitored at home 63-110. Denies hypoglycemic symptoms. - Discontinue Lantus  - Continue Metformin and Victoza - check CBGs - follow up in one month

## 2016-11-07 NOTE — Assessment & Plan Note (Signed)
On Quinapril 5mg , Lasix 40mg . SBP 90-110s. Denies hypotensive symptoms.  - Decrease Lasix to 20mg  - Keep BP diary - F/u in one month

## 2016-11-07 NOTE — Patient Instructions (Signed)
It was great to see you!  For your diabetes,  - You are doing so well with your blood sugars, we are discontinuing your Lantus.  For your blood pressure, - We are decreasing your Lasix to 20mg .  For your acid reflux, - We are checking a lab today to check for a bacterial infection. We will let you know what those results are. Continue taking Tums for relief.  Follow up in one month.  Take care and seek immediate care sooner if you develop any concerns.   Rory Percy, DO [facility]

## 2016-11-07 NOTE — Assessment & Plan Note (Signed)
On Omeprazole 40mg  daily for years with previously good control. H/o hiatal hernia. Recent worsening of heartburn in past few months relieved by Tums. 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

## 2016-11-22 ENCOUNTER — Ambulatory Visit: Payer: BLUE CROSS/BLUE SHIELD | Admitting: Registered Nurse

## 2016-11-29 ENCOUNTER — Encounter: Payer: Self-pay | Admitting: Registered Nurse

## 2016-11-29 ENCOUNTER — Encounter: Payer: BLUE CROSS/BLUE SHIELD | Attending: Physical Medicine & Rehabilitation | Admitting: Registered Nurse

## 2016-11-29 VITALS — BP 114/80 | HR 80

## 2016-11-29 DIAGNOSIS — M62838 Other muscle spasm: Secondary | ICD-10-CM

## 2016-11-29 DIAGNOSIS — M47816 Spondylosis without myelopathy or radiculopathy, lumbar region: Secondary | ICD-10-CM

## 2016-11-29 DIAGNOSIS — G894 Chronic pain syndrome: Secondary | ICD-10-CM | POA: Diagnosis not present

## 2016-11-29 DIAGNOSIS — M5416 Radiculopathy, lumbar region: Secondary | ICD-10-CM

## 2016-11-29 DIAGNOSIS — M961 Postlaminectomy syndrome, not elsewhere classified: Secondary | ICD-10-CM | POA: Diagnosis not present

## 2016-11-29 DIAGNOSIS — Z79899 Other long term (current) drug therapy: Secondary | ICD-10-CM | POA: Diagnosis not present

## 2016-11-29 DIAGNOSIS — M7062 Trochanteric bursitis, left hip: Secondary | ICD-10-CM | POA: Diagnosis not present

## 2016-11-29 DIAGNOSIS — Z5181 Encounter for therapeutic drug level monitoring: Secondary | ICD-10-CM

## 2016-11-29 MED ORDER — OXYCODONE-ACETAMINOPHEN 7.5-325 MG PO TABS: 1.0000 | ORAL_TABLET | Freq: Three times a day (TID) | ORAL | 0 refills | Status: DC | PRN

## 2016-11-29 MED ORDER — METHOCARBAMOL 500 MG PO TABS: ORAL_TABLET | ORAL | 2 refills | Status: DC

## 2016-11-29 NOTE — Progress Notes (Signed)
Subjective:    Patient ID: Christine Bradshaw, female    DOB: 12/23/1965, 51 y.o.   MRN: [id number]  HPI: [name] is a 51year old female who returns for follow up appointment for chronic pain and medication refill. She states her pain is located in her lower back radiating into her left hip and left lower extremity . Christine Bradshaw states her lower back pain and left hip pain has increased in intensity over the last two weeks, she has notice the correlation with her hip injection wearing off, she had 6 weeks of relief from hip injection. Based on her assessment we will resume her percocet, she verbalizes understanding. She rates her pain 6. Her current exercise regime is walking and using treadmill 5 minutes a day three times a day.  Christine Bradshaw Morphine equivalent is 30.68 MME. She is also prescribed Klonopin  by Dr Huel Cote, prescription written by Beryle Lathe NP. We have reviewed the black box warning of using opioids and benzodiazepines. I highlighted the dangers of using these drugs together and discussed the adverse events including respiratory suppression, overdose, cognitive impairment and importance of  compliance with current regimen. She verbalizes understanding, we will continue to monitor and adjust as indicated.   She is being closely monitored and under the care of her psychiatrist Dr Caprice Beaver.   S/P Bilateral Trochanteric Bursa Injection with 6 weeks of relief noted.   Oral Swab  was on 10/24/2016, it was consistent.    Pain Inventory Average Pain 6 Pain Right Now 6 My pain is sharp, burning and aching  In the last 24 hours, has pain interfered with the following? General activity 7 Relation with others 5 Enjoyment of life 6 What TIME of day is your pain at its worst? morning Sleep (in general) Good  Pain is worse with: walking, standing and some activites Pain improves with: heat/ice, medication and injections Relief from Meds:  6  Mobility walk without assistance walk with assistance use a walker how many minutes can you walk? 5 ability to climb steps?  yes do you drive?  no use a wheelchair transfers alone  Function I need assistance with the following:  household duties and shopping  Neuro/Psych trouble walking spasms depression anxiety  Prior Studies Any changes since last visit?  no  Physicians involved in your care Any changes since last visit?  no   Family History  Problem Relation Age of Onset  . Hyperlipidemia Father   . Hypertension Father   . Heart disease Father   . Colon polyps Neg Hx   . Colon cancer Neg Hx    Social History   Socioeconomic History  . Marital status: Married    Spouse name: Not on file  . Number of children: 2  . Years of education: Not on file  . Highest education level: Not on file  Social Needs  . Financial resource strain: Not on file  . Food insecurity - worry: Not on file  . Food insecurity - inability: Not on file  . Transportation needs - medical: Not on file  . Transportation needs - non-medical: Not on file  Occupational History  . Occupation: disability  Tobacco Use  . Smoking status: Current Every Day Smoker    Types: Cigarettes  . Smokeless tobacco: Never Used  . Tobacco comment: working on quitting.  Down to 3 cigs/day 06/08/16  Substance and Sexual Activity  . Alcohol use: No    Alcohol/week: 0.0 oz  .  Drug use: No  . Sexual activity: Yes    Partners: Male  Other Topics Concern  . Not on file  Social History Narrative   Husband on disability   Pt also on disability   Past Surgical History:  Procedure Laterality Date  . ABDOMINAL HYSTERECTOMY    . APPENDECTOMY    . CHOLECYSTECTOMY    . COLONOSCOPY W/ BIOPSIES  2010   adenomatous polyp repeat 2015  . COLONOSCOPY WITH PROPOFOL N/A 03/13/2013   Procedure: COLONOSCOPY WITH PROPOFOL;  Surgeon: Lafayette Dragon, MD;  Location: [facility];  Service: Endoscopy;  Laterality: N/A;  .  ESOPHAGOGASTRODUODENOSCOPY  2010   showed erosions  . LUMBAR FUSION  2011   L4-L5-S1 Dr. Marcial Pacas  . TONSILLECTOMY    . UPPER GASTROINTESTINAL ENDOSCOPY  2017   Past Medical History:  Diagnosis Date  . Anemia   . Arthritis   . Back pain, chronic   . Complication of anesthesia    manic episode  . Depression   . Diabetes mellitus type II   . GERD (gastroesophageal reflux disease)   . H/O hiatal hernia   . HEADACHE, CHRONIC 04/21/2008  . HX OF GALLSTONE 04/21/2008   Qualifier: Diagnosis of  By: Julaine Hua CMA ([place]), Estill Bamberg    . Hyperlipidemia   . Hypertension   . Insomnia    03-08-13"states is in control"  . Irritable bowel syndrome   . Memory loss of unknown cause   . Panic attack as reaction to stress   . Seizures ([place])    Passes out frequently, but not seizures per pt  . Syncope    Pt states happens once per week approximately  . Tremor    There were no vitals taken for this visit.  Opioid Risk Score:   Fall Risk Score:  `1  Depression screen PHQ 2/9  Depression screen [facility] 2/9 11/07/2016 09/22/2016 06/08/2016 03/07/2016 12/08/2015 11/10/2015 09/09/2015  Decreased Interest 0 0 0 0 0 0 0  Down, Depressed, Hopeless 0 0 0 0 0 0 0  PHQ - 2 Score 0 0 0 0 0 0 0  Altered sleeping - - - - - - -  Tired, decreased energy - - - - - - -  Change in appetite - - - - - - -  Feeling bad or failure about yourself  - - - - - - -  Trouble concentrating - - - - - - -  Moving slowly or fidgety/restless - - - - - - -  Suicidal thoughts - - - - - - -  PHQ-9 Score - - - - - - -  Difficult doing work/chores - - - - - - -  Some recent data might be hidden    Review of Systems  Constitutional: Negative.   HENT: Negative.   Eyes: Negative.   Respiratory: Negative.   Cardiovascular: Negative.   Gastrointestinal: Positive for constipation.  Endocrine: Negative.   Genitourinary: Negative.   Musculoskeletal: Positive for arthralgias, back pain and gait problem.       Spasms  Skin: Negative.     Allergic/Immunologic: Negative.   Hematological: Negative.   Psychiatric/Behavioral: Negative.        Objective:   Physical Exam  Constitutional: She is oriented to person, place, and time. She appears well-developed and well-nourished.  HENT:  Head: Normocephalic and atraumatic.  Neck: Normal range of motion. Neck supple.  Cardiovascular: Normal rate and regular rhythm.  Pulmonary/Chest: Effort normal and breath sounds normal.  Musculoskeletal:  Normal Muscle Bulk and Muscle Testing Reveals: Upper Extremities: Full ROM and Muscle Strength 5/5 Lumbar Paraspinal Tenderness: L-3-L-5  Mainly Left Side Left Greater Trochanter Tenderness Lower Extremities: Full ROM and Muscle Strength 5/5 Left Lower Extremity Flexion Produces Pain into Left Hip Arises from Chair with ease Narrow Based Gait  Neurological: She is alert and oriented to person, place, and time.  Skin: Skin is warm and dry.  Psychiatric: She has a normal mood and affect.  Nursing note and vitals reviewed.         Assessment & Plan:  1. Lumbar postlaminectomy syndrome- status post L4-5 and L5-S1 fusion with chronic postoperative pain as well as a chronic left L5 radiculopathy. 11/29/2016 RX: Oxycodone 7.5/325 mg one tablet every 8 hours #90. We will continue the opioid monitoring program, this consists of regular clinic visits, examinations, urine drug screen, pill counts as well as use of [place] Controlled Substance Reporting System. Continue with heat and exercise therapy. 2. Lumbar spondylosis above the level of the fusion: Continue HEP as tolerated. Continue to Monitor.11/29/2016 3. Left Greater Trochanteric Bursitis: S/P Bilateral Hip Injections with good relief noted. Continue with heat, Ice therapy and HEP as tolerated. 11/29/2016 4. Muscle Spasms: Continue Robaxin as needed. 11/29/2016  20 minutes of face to face patient care time was spent during this visit. All questions were encouraged and  answered.  F/U in 1 month.

## 2016-12-03 ENCOUNTER — Other Ambulatory Visit: Payer: Self-pay | Admitting: Family Medicine

## 2016-12-05 LAB — HM DIABETES EYE EXAM

## 2016-12-10 ENCOUNTER — Other Ambulatory Visit: Payer: Self-pay | Admitting: Family Medicine

## 2016-12-15 ENCOUNTER — Other Ambulatory Visit: Payer: Self-pay | Admitting: Family Medicine

## 2016-12-15 NOTE — Telephone Encounter (Signed)
Refills sent to pharmacy yesterday. Jazmin Hartsell,CMA

## 2016-12-15 NOTE — Telephone Encounter (Signed)
[place] delivery through [facility] needs dr to call them at [phone] because they have been trying to get in touch with dr to refill pt meds and dr hasnt responded. Please advise

## 2016-12-19 ENCOUNTER — Other Ambulatory Visit: Payer: Self-pay | Admitting: Family Medicine

## 2016-12-20 NOTE — Telephone Encounter (Signed)
Refilled patient's Quinapril but should make a follow up appointment for her diabetes and HTN as we changed her regimen at last visit

## 2016-12-21 ENCOUNTER — Ambulatory Visit: Payer: BLUE CROSS/BLUE SHIELD | Admitting: Registered Nurse

## 2016-12-21 NOTE — Telephone Encounter (Signed)
LM for patient ok per DPR and on identified VM asking her to call back and make a follow up appt for her diabetes and blood pressure. Jazmin Hartsell,CMA

## 2016-12-22 ENCOUNTER — Ambulatory Visit: Payer: BLUE CROSS/BLUE SHIELD | Admitting: Registered Nurse

## 2016-12-30 ENCOUNTER — Encounter: Payer: BLUE CROSS/BLUE SHIELD | Admitting: Registered Nurse

## 2017-01-01 ENCOUNTER — Other Ambulatory Visit: Payer: Self-pay | Admitting: Family Medicine

## 2017-01-04 ENCOUNTER — Encounter: Payer: BLUE CROSS/BLUE SHIELD | Attending: Physical Medicine & Rehabilitation | Admitting: Registered Nurse

## 2017-01-04 DIAGNOSIS — M47816 Spondylosis without myelopathy or radiculopathy, lumbar region: Secondary | ICD-10-CM | POA: Insufficient documentation

## 2017-01-06 ENCOUNTER — Other Ambulatory Visit: Payer: Self-pay | Admitting: Family Medicine

## 2017-01-06 DIAGNOSIS — E1140 Type 2 diabetes mellitus with diabetic neuropathy, unspecified: Secondary | ICD-10-CM

## 2017-01-06 DIAGNOSIS — Z794 Long term (current) use of insulin: Principal | ICD-10-CM

## 2017-01-11 NOTE — Progress Notes (Signed)
Patient had eye exam on 12/05/2016 at [facility]. Report is in the order level scan box. Hubbard Hartshorn, RN, BSN

## 2017-01-12 ENCOUNTER — Encounter: Payer: BLUE CROSS/BLUE SHIELD | Admitting: Registered Nurse

## 2017-01-12 ENCOUNTER — Encounter: Payer: Self-pay | Admitting: Registered Nurse

## 2017-01-12 ENCOUNTER — Other Ambulatory Visit: Payer: Self-pay

## 2017-01-12 VITALS — BP 107/77 | HR 80

## 2017-01-12 DIAGNOSIS — Z79899 Other long term (current) drug therapy: Secondary | ICD-10-CM

## 2017-01-12 DIAGNOSIS — M7062 Trochanteric bursitis, left hip: Secondary | ICD-10-CM | POA: Diagnosis not present

## 2017-01-12 DIAGNOSIS — M961 Postlaminectomy syndrome, not elsewhere classified: Secondary | ICD-10-CM | POA: Diagnosis not present

## 2017-01-12 DIAGNOSIS — Z5181 Encounter for therapeutic drug level monitoring: Secondary | ICD-10-CM | POA: Diagnosis not present

## 2017-01-12 DIAGNOSIS — M5416 Radiculopathy, lumbar region: Secondary | ICD-10-CM

## 2017-01-12 DIAGNOSIS — M47816 Spondylosis without myelopathy or radiculopathy, lumbar region: Secondary | ICD-10-CM | POA: Diagnosis not present

## 2017-01-12 DIAGNOSIS — G894 Chronic pain syndrome: Secondary | ICD-10-CM | POA: Diagnosis not present

## 2017-01-12 MED ORDER — METHOCARBAMOL 500 MG PO TABS: ORAL_TABLET | ORAL | 2 refills | Status: DC

## 2017-01-12 MED ORDER — OXYCODONE-ACETAMINOPHEN 7.5-325 MG PO TABS: 1.0000 | ORAL_TABLET | Freq: Three times a day (TID) | ORAL | 0 refills | Status: DC | PRN

## 2017-01-12 NOTE — Progress Notes (Signed)
Subjective:    Patient ID: Christine Bradshaw, female    DOB: June 29, 1965, 52 y.o.   MRN: [id number]  HPI: [name] is a 52year old female who returns for follow up appointment for chronic pain and medication refill. She states her pain is located in her lower back radiating into her left hip and left lower extremity. [name] states her pain has been controlled with the increase in Percocet.  She rates her pain 6. Her current exercise regime is walking and using treadmill 5 minutes a day three days a week.  Ms. Strain Morphine equivalent is 30.38 MME. She is also prescribed Klonopin  by Dr Huel Cote, prescription written by Beryle Lathe NP. We have reviewed the black box warning again regarding using opioids and benzodiazepines. I highlighted the dangers of using these drugs together and discussed the adverse events including respiratory suppression, overdose, cognitive impairment and importance of  compliance with current regimen. She verbalizes understanding, we will continue to monitor and adjust as indicated.    She is being closely monitored and under the care of her psychiatrist Dr Caprice Beaver.   Oral Swab  was on 10/24/2016, it was consistent.  UDS Performed Today.    Pain Inventory Average Pain 5 Pain Right Now 6 My pain is sharp and burning  In the last 24 hours, has pain interfered with the following? General activity 5 Relation with others 5 Enjoyment of life 7 What TIME of day is your pain at its worst? morning and daytime Sleep (in general) Good  Pain is worse with: walking, standing and some activites Pain improves with: rest, heat/ice and medication Relief from Meds: 7  Mobility walk with assistance use a walker how many minutes can you walk? 5 ability to climb steps?  yes do you drive?  no transfers alone  Function I need assistance with the following:  household duties and shopping  Neuro/Psych spasms depression anxiety  Prior  Studies Any changes since last visit?  no  Physicians involved in your care Any changes since last visit?  no   Family History  Problem Relation Age of Onset  . Hyperlipidemia Father   . Hypertension Father   . Heart disease Father   . Colon polyps Neg Hx   . Colon cancer Neg Hx    Social History   Socioeconomic History  . Marital status: Married    Spouse name: None  . Number of children: 2  . Years of education: None  . Highest education level: None  Social Needs  . Financial resource strain: None  . Food insecurity - worry: None  . Food insecurity - inability: None  . Transportation needs - medical: None  . Transportation needs - non-medical: None  Occupational History  . Occupation: disability  Tobacco Use  . Smoking status: Current Every Day Smoker    Types: Cigarettes  . Smokeless tobacco: Never Used  . Tobacco comment: working on quitting.  Down to 3 cigs/day 06/08/16  Substance and Sexual Activity  . Alcohol use: No    Alcohol/week: 0.0 oz  . Drug use: No  . Sexual activity: Yes    Partners: Male  Other Topics Concern  . None  Social History Narrative   Husband on disability   Pt also on disability   Past Surgical History:  Procedure Laterality Date  . ABDOMINAL HYSTERECTOMY    . APPENDECTOMY    . CHOLECYSTECTOMY    . COLONOSCOPY W/ BIOPSIES  2010  adenomatous polyp repeat 2015  . COLONOSCOPY WITH PROPOFOL N/A 03/13/2013   Procedure: COLONOSCOPY WITH PROPOFOL;  Surgeon: Lafayette Dragon, MD;  Location: [facility];  Service: Endoscopy;  Laterality: N/A;  . ESOPHAGOGASTRODUODENOSCOPY  2010   showed erosions  . LUMBAR FUSION  2011   L4-L5-S1 Dr. Marcial Pacas  . TONSILLECTOMY    . UPPER GASTROINTESTINAL ENDOSCOPY  2017   Past Medical History:  Diagnosis Date  . Anemia   . Arthritis   . Back pain, chronic   . Complication of anesthesia    manic episode  . Depression   . Diabetes mellitus type II   . GERD (gastroesophageal reflux disease)   . H/O  hiatal hernia   . HEADACHE, CHRONIC 04/21/2008  . HX OF GALLSTONE 04/21/2008   Qualifier: Diagnosis of  By: Julaine Hua CMA ([place]), Estill Bamberg    . Hyperlipidemia   . Hypertension   . Insomnia    03-08-13"states is in control"  . Irritable bowel syndrome   . Memory loss of unknown cause   . Panic attack as reaction to stress   . Seizures ([place])    Passes out frequently, but not seizures per pt  . Syncope    Pt states happens once per week approximately  . Tremor    There were no vitals taken for this visit.  Opioid Risk Score:  3 Fall Risk Score:  `1  Depression screen PHQ 2/9  Depression screen [facility] 2/9 01/12/2017 11/07/2016 09/22/2016 06/08/2016 03/07/2016 12/08/2015 11/10/2015  Decreased Interest 0 0 0 0 0 0 0  Down, Depressed, Hopeless 0 0 0 0 0 0 0  PHQ - 2 Score 0 0 0 0 0 0 0  Altered sleeping - - - - - - -  Tired, decreased energy - - - - - - -  Change in appetite - - - - - - -  Feeling bad or failure about yourself  - - - - - - -  Trouble concentrating - - - - - - -  Moving slowly or fidgety/restless - - - - - - -  Suicidal thoughts - - - - - - -  PHQ-9 Score - - - - - - -  Difficult doing work/chores - - - - - - -  Some recent data might be hidden    Review of Systems  Constitutional: Negative.   HENT: Negative.   Eyes: Negative.   Respiratory: Negative.   Cardiovascular: Negative.   Gastrointestinal: Positive for constipation.  Endocrine: Negative.   Genitourinary: Negative.   Musculoskeletal: Positive for arthralgias, back pain and gait problem.       Spasms  Skin: Negative.   Allergic/Immunologic: Negative.   Hematological: Negative.   Psychiatric/Behavioral: Negative.        Objective:   Physical Exam  Constitutional: She is oriented to person, place, and time. She appears well-developed and well-nourished.  HENT:  Head: Normocephalic and atraumatic.  Neck: Normal range of motion. Neck supple.  Cardiovascular: Normal rate and regular rhythm.  Pulmonary/Chest:  Effort normal and breath sounds normal.  Musculoskeletal:  Normal Muscle Bulk and Muscle Testing Reveals: Upper Extremities: Full ROM and Muscle Strength 5/5 Lumbar Paraspinal Tenderness: L-3-L-5  Mainly Left Side Left Greater Trochanter Tenderness Lower Extremities: Full ROM and Muscle Strength 5/5 Arises from Chair with ease Narrow Based Gait  Neurological: She is alert and oriented to person, place, and time.  Skin: Skin is warm and dry.  Psychiatric: She has a normal mood and affect.  Nursing note and vitals reviewed.         Assessment & Plan:  1. Lumbar postlaminectomy syndrome- status post L4-5 and L5-S1 fusion with chronic postoperative pain as well as a chronic left L5 radiculopathy. 01/12/2017 Refilled: Oxycodone 7.5/325 mg one tablet every 8 hours #90. We will continue the opioid monitoring program, this consists of regular clinic visits, examinations, urine drug screen, pill counts as well as use of [place] Controlled Substance Reporting System. Continue with heat and exercise therapy. 2. Lumbar spondylosis above the level of the fusion: Continue HEP as tolerated. Continue to Monitor.01/12/2017 3. Left Greater Trochanteric Bursitis: S/P Bilateral Hip Injections on 09/27/2016 with good relief noted. Continue with heat, Ice therapy and HEP as tolerated. 01/12/2017 4. Muscle Spasms: Continue Robaxin as needed. 01/12/2017  20 minutes of face to face patient care time was spent during this visit. All questions were encouraged and answered.  F/U in 1 month.

## 2017-01-12 NOTE — Progress Notes (Signed)
Hey! What do I need to do in order to update this to her health maintenance? Thanks! --Bryson Ha

## 2017-01-16 ENCOUNTER — Encounter: Payer: Self-pay | Admitting: Family Medicine

## 2017-01-19 LAB — TOXASSURE SELECT,+ANTIDEPR,UR

## 2017-01-23 ENCOUNTER — Telehealth: Payer: Self-pay | Admitting: *Deleted

## 2017-01-23 NOTE — Telephone Encounter (Signed)
Urine drug screen for this encounter is consistent for prescribed medication 

## 2017-01-25 NOTE — Progress Notes (Signed)
   Subjective:   Patient ID: Christine Bradshaw    DOB: 31-May-1965, 52 y.o. female   MRN: [id number]  Christine Bradshaw is a 52 y.o. female with a history of diabetes, HTN here for   Hypertension: - At last visit 11/2016, instructed to keep BP diary - Medications: Quinapril 5mg , Lasix 20mg  - somtimes takes 40mg  Lasix when she notices swelling, then takes her weight. Ends up taking increased Lasix ~3x per month. - Compliance: good - Checking BP at home: yes, BP recordings with SBP 99-120s the past week. - Endorsing frequent "dizzy spells" where she "misses her step when walking." Denies tripping, falls, lightheadedness, loss of consciousness, vision changes. Notices it when standing from a sitting position. Time of day doesn't matter.   Diabetes, Type 2 - Last A1c 4.7 09/22/2016. Lantus d/ced at last visit.  - A1c 5.0 today. - Medications: Metformin 1000mg  BID, Victoza 1.8mg  - Compliance: good - Checking BG at home: yes, 70-110s - Foot exam: complete at last visit. Has known neuropathy in feet. - Eye exam: complete a few months ago. Will obtain records   Review of Systems:  Per HPI.   Banks: reviewed. Smoking status reviewed. Medications reviewed.  Objective:   BP 110/78   Pulse (!) 104   Temp 98.2 F (36.8 C) (Oral)   Ht 5\' 4"  (1.626 m)   Wt 210 lb 3.2 oz (95.3 kg)   SpO2 99%   BMI 36.08 kg/m  Vitals and nursing note reviewed.  General: well nourished, well developed, in no acute distress with non-toxic appearance CV: regular rate  Lungs: normal work of breathing Skin: warm, dry, no rashes or lesions Extremities: warm and well perfused, normal tone MSK: ROM grossly intact, strength intact, gait normal Neuro: Alert and oriented, speech normal  Orthostatics: Lying - 104/72 Sitting - 98/70 Standing - 88/62  Assessment & Plan:   Diabetes mellitus, type II, insulin dependent (HCC) A1c 5.0 today. On Victoza 1.8mg , Metformin 1000mg .  - Will obtain recent eye exam  results - decrease Victoza to 1.2mg  daily - f/u 3 months  Essential hypertension Low normal BP today 110/78. Patient endorsed "dizzy spells" when standing from seated position, likely due to medication-induced orthostatic hypotension. Orthostatics positive in office today. Explained dangers of too tightly controlled BP, especially in the elderly. Return precautions given. - Stop Lasix. Take 20mg  ONLY when needed for weight gain >2 lbs in one day or extensive swelling. - continue Quinapril 5 mg.  - f/u 1 month  Orders Placed This Encounter  Procedures  . HgB A1c   Meds ordered this encounter  Medications  . liraglutide (VICTOZA) 18 MG/3ML SOPN    Sig: Inject 0.2 mLs (1.2 mg total) into the skin daily.    Dispense:  3 mL    Refill:  2    Rory Percy, DO PGY-1, [facility] 01/27/2017 3:46 PM

## 2017-01-27 ENCOUNTER — Encounter: Payer: Self-pay | Admitting: Family Medicine

## 2017-01-27 ENCOUNTER — Other Ambulatory Visit: Payer: Self-pay

## 2017-01-27 ENCOUNTER — Ambulatory Visit: Payer: BLUE CROSS/BLUE SHIELD | Admitting: Family Medicine

## 2017-01-27 VITALS — BP 110/78 | HR 104 | Temp 98.2°F | Ht 64.0 in | Wt 210.2 lb

## 2017-01-27 DIAGNOSIS — Z794 Long term (current) use of insulin: Secondary | ICD-10-CM | POA: Diagnosis not present

## 2017-01-27 DIAGNOSIS — I10 Essential (primary) hypertension: Secondary | ICD-10-CM | POA: Diagnosis not present

## 2017-01-27 DIAGNOSIS — E119 Type 2 diabetes mellitus without complications: Secondary | ICD-10-CM

## 2017-01-27 LAB — POCT GLYCOSYLATED HEMOGLOBIN (HGB A1C): Hemoglobin A1C: 5

## 2017-01-27 MED ORDER — LIRAGLUTIDE 18 MG/3ML ~~LOC~~ SOPN: 1.2000 mg | PEN_INJECTOR | Freq: Every day | SUBCUTANEOUS | 2 refills | Status: DC

## 2017-01-27 NOTE — Patient Instructions (Signed)
It was great to see you!  For your diabetes,  - Your blood sugars are fantastic! Your A1c today is 5.0. - We are decreasing the dose of your Victoza to 1.2mg . I sent a prescription to the pharmacy in case you need a refill.  For your blood pressure, - Your dizzy spells are due to low blood pressures. I am concerned that tight control of your blood pressure will cause falls and injury if we keep too tight of control of them with medication. - Stop the Lasix and only use when needed for a lot of swelling or weight gain greater than 2 lbs in one day. - Continue on Quinapril 5mg  for kidney protection for your diabetes.  Take care and seek immediate care sooner if you develop any concerns.   Rory Percy, DO [facility]

## 2017-01-27 NOTE — Assessment & Plan Note (Addendum)
A1c 5.0 today. On Victoza 1.8mg , Metformin 1000mg .  - Will obtain recent eye exam results - decrease Victoza to 1.2mg  daily - f/u 3 months

## 2017-01-27 NOTE — Assessment & Plan Note (Addendum)
Low normal BP today 110/78. Patient endorsed "dizzy spells" when standing from seated position, likely due to medication-induced orthostatic hypotension. Orthostatics positive in office today. Explained dangers of too tightly controlled BP, especially in the elderly. Return precautions given. - Stop Lasix. Take 20mg  ONLY when needed for weight gain >2 lbs in one day or extensive swelling. - continue Quinapril 5 mg.  - f/u 1 month

## 2017-02-02 ENCOUNTER — Encounter: Payer: Self-pay | Admitting: Family Medicine

## 2017-02-10 ENCOUNTER — Other Ambulatory Visit: Payer: Self-pay | Admitting: Family Medicine

## 2017-02-10 ENCOUNTER — Encounter: Payer: Self-pay | Admitting: Registered Nurse

## 2017-02-10 ENCOUNTER — Encounter: Payer: BLUE CROSS/BLUE SHIELD | Attending: Physical Medicine & Rehabilitation | Admitting: Registered Nurse

## 2017-02-10 ENCOUNTER — Other Ambulatory Visit: Payer: Self-pay

## 2017-02-10 VITALS — BP 100/71 | HR 99

## 2017-02-10 DIAGNOSIS — M7061 Trochanteric bursitis, right hip: Secondary | ICD-10-CM

## 2017-02-10 DIAGNOSIS — M7062 Trochanteric bursitis, left hip: Secondary | ICD-10-CM

## 2017-02-10 DIAGNOSIS — M47816 Spondylosis without myelopathy or radiculopathy, lumbar region: Secondary | ICD-10-CM | POA: Diagnosis not present

## 2017-02-10 DIAGNOSIS — Z79899 Other long term (current) drug therapy: Secondary | ICD-10-CM

## 2017-02-10 DIAGNOSIS — G894 Chronic pain syndrome: Secondary | ICD-10-CM

## 2017-02-10 DIAGNOSIS — M961 Postlaminectomy syndrome, not elsewhere classified: Secondary | ICD-10-CM

## 2017-02-10 DIAGNOSIS — M5416 Radiculopathy, lumbar region: Secondary | ICD-10-CM

## 2017-02-10 DIAGNOSIS — Z5181 Encounter for therapeutic drug level monitoring: Secondary | ICD-10-CM

## 2017-02-10 MED ORDER — OXYCODONE-ACETAMINOPHEN 7.5-325 MG PO TABS: 1.0000 | ORAL_TABLET | Freq: Three times a day (TID) | ORAL | 0 refills | Status: DC | PRN

## 2017-02-10 NOTE — Addendum Note (Signed)
Addended by: Caro Hight on: 02/10/2017 10:48 AM   Modules accepted: Orders

## 2017-02-10 NOTE — Progress Notes (Signed)
Subjective:    Patient ID: ABBIEGAIL Bradshaw, female    DOB: 01-13-1965, 52 y.o.   MRN: [id number]  HPI: Christine Bradshaw is a 52year old female who returns for follow up appointment for chronic pain and medication refill. She states her pain is located in her lower back radiating into her buttocks, bilateral hips L>R and left lower extremity to her left patella. She rates her pain 6. Her current exercise regime is walking.   [name] Morphine equivalent is 29.25 MME. She is also prescribed Klonopin  by Dr Huel Cote, prescription written by Beryle Lathe NP. We have reviewed the black box warning again regarding using opioids and benzodiazepines. I highlighted the dangers of using these drugs together and discussed the adverse events including respiratory suppression, overdose, cognitive impairment and importance of  compliance with current regimen. She verbalizes understanding, we will continue to monitor and adjust as indicated.    She is being closely monitored and under the care of her psychiatrist Dr Caprice Beaver.   UDS was Performed on  01/12/2017, it was consistent.    Pain Inventory Average Pain 6 Pain Right Now 7 My pain is sharp, burning and aching  In the last 24 hours, has pain interfered with the following? General activity 6 Relation with others 5 Enjoyment of life 7 What TIME of day is your pain at its worst? morning and daytime Sleep (in general) Good  Pain is worse with: walking, standing and some activites Pain improves with: rest, heat/ice and medication Relief from Meds: 7  Mobility walk with assistance use a walker how many minutes can you walk? 5 ability to climb steps?  yes do you drive?  no transfers alone  Function I need assistance with the following:  household duties and shopping  Neuro/Psych spasms depression anxiety  Prior Studies Any changes since last visit?  no  Physicians involved in your care Any changes since last  visit?  no   Family History  Problem Relation Age of Onset  . Hyperlipidemia Father   . Hypertension Father   . Heart disease Father   . Colon polyps Neg Hx   . Colon cancer Neg Hx    Social History   Socioeconomic History  . Marital status: Married    Spouse name: None  . Number of children: 2  . Years of education: None  . Highest education level: None  Social Needs  . Financial resource strain: None  . Food insecurity - worry: None  . Food insecurity - inability: None  . Transportation needs - medical: None  . Transportation needs - non-medical: None  Occupational History  . Occupation: disability  Tobacco Use  . Smoking status: Current Every Day Smoker    Types: Cigarettes  . Smokeless tobacco: Never Used  . Tobacco comment: working on quitting.  Down to 3 cigs/day 06/08/16  Substance and Sexual Activity  . Alcohol use: No    Alcohol/week: 0.0 oz  . Drug use: No  . Sexual activity: Yes    Partners: Male  Other Topics Concern  . None  Social History Narrative   Husband on disability   Pt also on disability   Past Surgical History:  Procedure Laterality Date  . ABDOMINAL HYSTERECTOMY    . APPENDECTOMY    . CHOLECYSTECTOMY    . COLONOSCOPY W/ BIOPSIES  2010   adenomatous polyp repeat 2015  . COLONOSCOPY WITH PROPOFOL N/A 03/13/2013   Procedure: COLONOSCOPY WITH PROPOFOL;  Surgeon: Sydell Axon  Andris Baumann, MD;  Location: [facility];  Service: Endoscopy;  Laterality: N/A;  . ESOPHAGOGASTRODUODENOSCOPY  2010   showed erosions  . LUMBAR FUSION  2011   L4-L5-S1 Dr. Marcial Pacas  . TONSILLECTOMY    . UPPER GASTROINTESTINAL ENDOSCOPY  2017   Past Medical History:  Diagnosis Date  . Anemia   . Arthritis   . Back pain, chronic   . Complication of anesthesia    manic episode  . Depression   . Diabetes mellitus type II   . GERD (gastroesophageal reflux disease)   . H/O hiatal hernia   . HEADACHE, CHRONIC 04/21/2008  . HX OF GALLSTONE 04/21/2008   Qualifier: Diagnosis of   By: Julaine Hua CMA ([place]), Estill Bamberg    . Hyperlipidemia   . Hypertension   . Insomnia    03-08-13"states is in control"  . Irritable bowel syndrome   . Memory loss of unknown cause   . Panic attack as reaction to stress   . Seizures ([place])    Passes out frequently, but not seizures per pt  . Syncope    Pt states happens once per week approximately  . Tremor    BP 100/71   Pulse 99   SpO2 97%   Opioid Risk Score:  3 Fall Risk Score:  `1  Depression screen PHQ 2/9  Depression screen [facility] 2/9 02/10/2017 01/27/2017 01/12/2017 11/07/2016 09/22/2016 06/08/2016 03/07/2016  Decreased Interest 2 0 0 0 0 0 0  Down, Depressed, Hopeless 2 0 0 0 0 0 0  PHQ - 2 Score 4 0 0 0 0 0 0  Altered sleeping - - - - - - -  Tired, decreased energy - - - - - - -  Change in appetite - - - - - - -  Feeling bad or failure about yourself  - - - - - - -  Trouble concentrating - - - - - - -  Moving slowly or fidgety/restless - - - - - - -  Suicidal thoughts - - - - - - -  PHQ-9 Score - - - - - - -  Difficult doing work/chores - - - - - - -  Some recent data might be hidden    Review of Systems  Constitutional: Negative.   HENT: Negative.   Eyes: Negative.   Respiratory: Negative.   Cardiovascular: Negative.   Gastrointestinal: Positive for constipation.  Endocrine: Negative.   Genitourinary: Negative.   Musculoskeletal: Positive for arthralgias, back pain and gait problem.       Spasms  Skin: Negative.   Allergic/Immunologic: Negative.   Hematological: Negative.   Psychiatric/Behavioral: Negative.        Objective:   Physical Exam  Constitutional: She is oriented to person, place, and time. She appears well-developed and well-nourished.  HENT:  Head: Normocephalic and atraumatic.  Neck: Normal range of motion. Neck supple.  Cardiovascular: Normal rate and regular rhythm.  Pulmonary/Chest: Effort normal and breath sounds normal.  Musculoskeletal:  Normal Muscle Bulk and Muscle Testing Reveals: Upper  Extremities: Full ROM and Muscle Strength 5/5 Lumbar Paraspinal Tenderness: L-3-L-5 Bilateral  Greater Trochanter Tenderness: L>R Lower Extremities: Full ROM and Muscle Strength 5/5 Arises from Chair with ease Narrow Based Gait  Neurological: She is alert and oriented to person, place, and time.  Skin: Skin is warm and dry.  Psychiatric: She has a normal mood and affect.  Nursing note and vitals reviewed.         Assessment & Plan:  1.  Lumbar postlaminectomy syndrome- status post L4-5 and L5-S1 fusion with chronic postoperative pain as well as a chronic left L5 radiculopathy. 02/10/2017 Refilled: Oxycodone 7.5/325 mg one tablet every 8 hours #90. We will continue the opioid monitoring program, this consists of regular clinic visits, examinations, urine drug screen, pill counts as well as use of [place] Controlled Substance Reporting System. Continue with heat and exercise therapy. 2. Lumbar spondylosis above the level of the fusion: Continue HEP as tolerated. Continue to Monitor.02/10/2017 3. Bilateral Greater Trochanteric Bursitis: Schedule Bilateral Hip Injections with Dr. Letta Pate. Continue with heat, Ice therapy and HEP as tolerated. 02/10/2017 4. Muscle Spasms: Continue Robaxin as needed. 02/10/2017  20 minutes of face to face patient care time was spent during this visit. All questions were encouraged and answered.  F/U in 1 month.

## 2017-02-16 ENCOUNTER — Other Ambulatory Visit: Payer: Self-pay | Admitting: Family Medicine

## 2017-02-21 ENCOUNTER — Other Ambulatory Visit: Payer: Self-pay

## 2017-02-21 MED ORDER — IPRATROPIUM BROMIDE 0.03 % NA SOLN: NASAL | 4 refills | Status: DC

## 2017-02-21 NOTE — Telephone Encounter (Signed)
Patient left message on nurse line requesting refill of Atrovent for her nasal allergies. Danley Danker, RN [facility] RN)

## 2017-03-07 ENCOUNTER — Ambulatory Visit: Payer: BLUE CROSS/BLUE SHIELD | Admitting: Physical Medicine & Rehabilitation

## 2017-03-07 ENCOUNTER — Encounter: Payer: BLUE CROSS/BLUE SHIELD | Attending: Physical Medicine & Rehabilitation

## 2017-03-07 ENCOUNTER — Encounter: Payer: Self-pay | Admitting: Physical Medicine & Rehabilitation

## 2017-03-07 VITALS — BP 116/81 | HR 82 | Resp 14

## 2017-03-07 DIAGNOSIS — M7061 Trochanteric bursitis, right hip: Secondary | ICD-10-CM

## 2017-03-07 DIAGNOSIS — M5416 Radiculopathy, lumbar region: Secondary | ICD-10-CM

## 2017-03-07 DIAGNOSIS — M47816 Spondylosis without myelopathy or radiculopathy, lumbar region: Secondary | ICD-10-CM | POA: Insufficient documentation

## 2017-03-07 DIAGNOSIS — M7062 Trochanteric bursitis, left hip: Secondary | ICD-10-CM | POA: Diagnosis not present

## 2017-03-07 MED ORDER — OXYCODONE-ACETAMINOPHEN 7.5-325 MG PO TABS: 1.0000 | ORAL_TABLET | Freq: Three times a day (TID) | ORAL | 0 refills | Status: DC | PRN

## 2017-03-07 NOTE — Progress Notes (Signed)
Bilateral Trochanteric bursa injection With ultrasound guidance  Indication Trochanteric bursitis. Exam has tenderness over the greater trochanter of the hip. Pain has not responded to conservative care such as exercise therapy and oral medications. Pain interferes with sleep or with mobility Informed consent was obtained after describing risks and benefits of the procedure with the patient these include bleeding bruising and infection. Patient has signed written consent form. Patient placed in a lateral decubitus position with the affected hip superior.Greater trochanter was scanned and identified,  marked and prepped with Betadine and entered with a needle to bone contact under direct [place] visualization. Needle slightly withdrawn then 6mg  of betamethasone with 4 cc 1% lidocaine were injected. This was first done on th eRIght and then on  The left.  Patient tolerated procedure well. Post procedure instructions given.   Continue oxycodone   7.5 mg every 8 hours

## 2017-03-07 NOTE — Patient Instructions (Addendum)
Will do epidural injection next visit for the left sided back pain going into the thigh

## 2017-03-09 ENCOUNTER — Other Ambulatory Visit: Payer: Self-pay

## 2017-03-09 MED ORDER — FUROSEMIDE 20 MG PO TABS: 20.0000 mg | ORAL_TABLET | Freq: Every day | ORAL | 1 refills | Status: DC

## 2017-03-09 NOTE — Telephone Encounter (Signed)
Pt requesting 3 month refill of furosemide 40mg . Pt call back [phone] Wallace Cullens, RN

## 2017-03-10 ENCOUNTER — Ambulatory Visit: Payer: BLUE CROSS/BLUE SHIELD | Admitting: Physical Medicine & Rehabilitation

## 2017-03-14 ENCOUNTER — Other Ambulatory Visit: Payer: Self-pay | Admitting: Family Medicine

## 2017-03-15 ENCOUNTER — Other Ambulatory Visit: Payer: Self-pay | Admitting: Family Medicine

## 2017-03-15 ENCOUNTER — Telehealth: Payer: Self-pay | Admitting: Physical Medicine & Rehabilitation

## 2017-03-15 MED ORDER — PRAVASTATIN SODIUM 40 MG PO TABS: 40.0000 mg | ORAL_TABLET | Freq: Every evening | ORAL | 0 refills | Status: DC

## 2017-03-15 MED ORDER — FUROSEMIDE 20 MG PO TABS: 20.0000 mg | ORAL_TABLET | Freq: Every day | ORAL | 1 refills | Status: DC

## 2017-03-15 MED ORDER — QUINAPRIL HCL 5 MG PO TABS: 5.0000 mg | ORAL_TABLET | Freq: Every day | ORAL | 0 refills | Status: DC

## 2017-03-15 MED ORDER — OMEPRAZOLE 40 MG PO CPDR: 40.0000 mg | DELAYED_RELEASE_CAPSULE | Freq: Every day | ORAL | 0 refills | Status: DC

## 2017-03-15 MED ORDER — METFORMIN HCL 1000 MG PO TABS: 1000.0000 mg | ORAL_TABLET | Freq: Two times a day (BID) | ORAL | 0 refills | Status: DC

## 2017-03-15 NOTE — Addendum Note (Signed)
Addended by: Valerie Roys on: 03/15/2017 12:59 PM   Modules accepted: Orders

## 2017-03-15 NOTE — Telephone Encounter (Signed)
Patient is having an injection on 3/26 and would like to know more about this injection.  Please call patient.

## 2017-03-15 NOTE — Telephone Encounter (Signed)
Injection is a Transforminal ESI

## 2017-03-16 NOTE — Telephone Encounter (Signed)
Informed patient, she verbalized understanding

## 2017-03-16 NOTE — Telephone Encounter (Signed)
Epidural injection is for pain that originates from the spinal nerve routes.  In her case it is the Left L3 nerve root that can cause pain sensations in the left thigh.  Lidocaine and a steroid are injected.  Duration of pain relief varies from weeks to months.  Injection side effect include bleeding and bruising, temporary leg weakness ~30 min, rare side effects are infection and paralysis of leg.  We can discuss further before injection

## 2017-03-20 ENCOUNTER — Other Ambulatory Visit: Payer: Self-pay | Admitting: Family Medicine

## 2017-03-22 ENCOUNTER — Other Ambulatory Visit: Payer: Self-pay | Admitting: Family Medicine

## 2017-03-22 ENCOUNTER — Telehealth: Payer: Self-pay

## 2017-03-22 ENCOUNTER — Other Ambulatory Visit: Payer: Self-pay

## 2017-03-22 MED ORDER — QUINAPRIL HCL 5 MG PO TABS: 5.0000 mg | ORAL_TABLET | Freq: Every day | ORAL | 0 refills | Status: AC

## 2017-03-22 MED ORDER — OMEPRAZOLE 40 MG PO CPDR: 40.0000 mg | DELAYED_RELEASE_CAPSULE | Freq: Every day | ORAL | 0 refills | Status: DC

## 2017-03-22 MED ORDER — PRAVASTATIN SODIUM 40 MG PO TABS: 40.0000 mg | ORAL_TABLET | Freq: Every evening | ORAL | 0 refills | Status: DC

## 2017-03-22 MED ORDER — METFORMIN HCL 1000 MG PO TABS: 1000.0000 mg | ORAL_TABLET | Freq: Two times a day (BID) | ORAL | 2 refills | Status: DC

## 2017-03-22 NOTE — Telephone Encounter (Signed)
Pharmacy calling for clarification of metformin- written for BID, dispense # 30. Wanting to clarify of patient should receive 30 or 60, but also requesting to fill a 3 month supply. I see where she was only given a 1 month supply with no refills. Will forward to MD to check if 90 day supply is ok. [place] Wallace Cullens, RN

## 2017-03-22 NOTE — Telephone Encounter (Signed)
Updated refill sent.  Rory Percy, DO PGY-1, [facility] 03/22/2017 6:14 PM

## 2017-03-22 NOTE — Telephone Encounter (Signed)
Pt called nurse line requesting refills of quinapril, omeprazole and pravastatin. Pt call back [phone] Wallace Cullens, RN

## 2017-03-28 ENCOUNTER — Encounter: Payer: Self-pay | Admitting: Physical Medicine & Rehabilitation

## 2017-03-28 ENCOUNTER — Ambulatory Visit (HOSPITAL_BASED_OUTPATIENT_CLINIC_OR_DEPARTMENT_OTHER): Payer: BLUE CROSS/BLUE SHIELD | Admitting: Physical Medicine & Rehabilitation

## 2017-03-28 VITALS — BP 91/58 | HR 106

## 2017-03-28 DIAGNOSIS — M47816 Spondylosis without myelopathy or radiculopathy, lumbar region: Secondary | ICD-10-CM | POA: Diagnosis not present

## 2017-03-28 DIAGNOSIS — M5416 Radiculopathy, lumbar region: Secondary | ICD-10-CM

## 2017-03-28 MED ORDER — OXYCODONE-ACETAMINOPHEN 7.5-325 MG PO TABS: 1.0000 | ORAL_TABLET | Freq: Three times a day (TID) | ORAL | 0 refills | Status: DC | PRN

## 2017-03-28 NOTE — Progress Notes (Signed)
  PROCEDURE RECORD [facility]   Name: Christine Bradshaw DOB:Sep 22, 1965 MRN: [id number]  Date:03/28/2017  Physician: Alysia Penna, MD    Nurse/CMA: Jarel Cuadra CMA / Truman Hayward CMA  Allergies:  Allergies  Allergen Reactions  . Abilify [Aripiprazole]     Shortness of breath, cramps, shakes   . Lithium     Kidneys stop    Consent Signed: Yes.    Is patient diabetic? Yes.    CBG today? NA  Pregnant: No. LMP: No LMP recorded. Patient has had a hysterectomy. (age 41-55)  Anticoagulants: no Anti-inflammatory: yes (ASA 81mg  this AM) Antibiotics: no  Procedure: Transforminal ESI L3 left   Position: Prone   Start Time:  1045am            End Time: 1052am             Fluoro Time: 28s                   RN/CMA Janah Mcculloh CMA Jere Bostrom CMA    Time 1027am 1058    BP 91/58 109/75    Pulse 106 101    Respirations 16 16    O2 Sat 94 94    S/S 6 6    Pain Level 6/10 4/10     D/C home with Husband, [name] & O X 3, D/C instructions reviewed, and sits independently.

## 2017-03-28 NOTE — Progress Notes (Signed)
LEFT L3 Lumbar transforaminal epidural steroid injection under fluoroscopic guidance  Indication: Lumbosacral radiculitis is not relieved by medication management or other conservative care and interfering with self-care and mobility.   Informed consent was obtained after describing risk and benefits of the procedure with the patient, this includes bleeding, bruising, infection, paralysis and medication side effects.  The patient wishes to proceed and has given written consent.  Patient was placed in prone position.  The lumbar area was marked and prepped with Betadine.  It was entered with a 25-gauge 1-1/2 inch needle and one mL of 1% lidocaine was injected into the skin and subcutaneous tissue.  Then a 22-gauge 5in spinal needle was inserted into the Left L3-4  intervertebral foramen under AP, lateral, and oblique view.  Then a solution containing one mL of 10 mg per mL dexamethasone and 2 mL of 1% lidocaine was injected.  The patient tolerated procedure well.  Post procedure instructions were given.  Please see post procedure form.

## 2017-03-28 NOTE — Patient Instructions (Signed)

## 2017-04-01 ENCOUNTER — Other Ambulatory Visit: Payer: Self-pay | Admitting: Family Medicine

## 2017-04-07 ENCOUNTER — Other Ambulatory Visit: Payer: Self-pay

## 2017-04-07 ENCOUNTER — Ambulatory Visit: Payer: BLUE CROSS/BLUE SHIELD | Admitting: Family Medicine

## 2017-04-07 ENCOUNTER — Encounter: Payer: Self-pay | Admitting: Family Medicine

## 2017-04-07 VITALS — BP 100/68 | HR 89 | Temp 98.1°F | Ht 64.0 in | Wt 211.2 lb

## 2017-04-07 DIAGNOSIS — E119 Type 2 diabetes mellitus without complications: Secondary | ICD-10-CM

## 2017-04-07 DIAGNOSIS — Z794 Long term (current) use of insulin: Secondary | ICD-10-CM | POA: Diagnosis not present

## 2017-04-07 DIAGNOSIS — I10 Essential (primary) hypertension: Secondary | ICD-10-CM

## 2017-04-07 MED ORDER — OMEPRAZOLE 40 MG PO CPDR: 40.0000 mg | DELAYED_RELEASE_CAPSULE | Freq: Every day | ORAL | 1 refills | Status: AC

## 2017-04-07 MED ORDER — PRAVASTATIN SODIUM 40 MG PO TABS: 40.0000 mg | ORAL_TABLET | Freq: Every evening | ORAL | 2 refills | Status: AC

## 2017-04-07 NOTE — Patient Instructions (Signed)
It was great to see you!  For your diabetes,  - Your diabetes is very well controlled! - Stop the Victoza and come back in a couple months for recheck an A1c, we'll do labs at that time.  For your blood pressure, - Your blood pressure continues to be low. When you have swelling, you can cut the tablets in half instead of taking a whole pill.  Follow up in 1-2 months.  Take care and seek immediate care sooner if you develop any concerns.   Dr. [facility]

## 2017-04-07 NOTE — Assessment & Plan Note (Addendum)
Continues to be low normal despite optimizing and lowering medical therapy. Taking Lasix prn for LE swelling, instructed to cut tablet in half. Continue quinapril for renal protection. Asymptomatic for hypotension. Follow up in 1-2 months.

## 2017-04-07 NOTE — Progress Notes (Signed)
   Subjective:   Patient ID: KINLEY DOZIER    DOB: [birth year], 52 y.o. female   MRN: [id number]  MALAYAH DEMURO is a 52 y.o. female with a history of HTN, DM, GERD, HLD, obesity, dipolar d/o, IBS here for   Hypertension: - Medications: Lasix 20mg  PRN for swelling, Quinapril 5mg  - Compliance: good, taking lasix as needed about 3x per week. - Checking BP at home: yes, low normal per patient documentation (SBP 96-116, DBP 66-76) - Denies any SOB, CP, vision changes, LE edema, medication SEs, or symptoms of hypotension - Exercise: moves around quite a bit but can't exercise due to chronic back pain - takes muscle relaxer for back TID, percocet TID scheduled  Diabetes, Type 2 - Last A1c 5.0 01/27/2017 - Medications: Victoza 1.2mg , Metformin 1000mg  - Compliance: good - Checking BG at home: yes, 80s-100s - Eye exam: 12/05/2016 - Foot exam: 01/27/17 - Denies symptoms of hypoglycemia, polyuria, polydipsia, numbness extremities, foot ulcers/trauma.  Review of Systems:  Per HPI.   [place]: reviewed. Smoking status reviewed. Medications reviewed.  Objective:   BP 100/68   Pulse 89   Temp 98.1 F (36.7 C) (Oral)   Ht 5\' 4"  (1.626 m)   Wt 211 lb 3.2 oz (95.8 kg)   SpO2 99%   BMI 36.25 kg/m  Vitals and nursing note reviewed.  General: well nourished, well developed, in no acute distress with non-toxic appearance CV: regular rate and rhythm without murmurs, rubs, or gallops, no lower extremity edema Lungs: clear to auscultation bilaterally with normal work of breathing Skin: warm, dry, no rashes or lesions Extremities: warm and well perfused, normal tone MSK: ROM grossly intact, strength intact, gait normal Neuro: Alert and oriented, speech normal  Assessment & Plan:   Essential hypertension Continues to be low normal despite optimizing and lowering medical therapy. Taking Lasix prn for LE swelling, instructed to cut tablet in half. Continue quinapril for renal protection.  Asymptomatic for hypotension. Follow up in 1-2 months.  Diabetes mellitus, type II, insulin dependent ([place]) Too early for A1c. Based on patient's charting of CBGs, low normal and good control. On discussion with patient, will trial off of Victoza and see her back in 1-2 months for repeat A1c. Will do labs at that time.  No orders of the defined types were placed in this encounter.  Meds ordered this encounter  Medications  . pravastatin (PRAVACHOL) 40 MG tablet    Sig: Take 1 tablet (40 mg total) by mouth every evening.    Dispense:  90 tablet    Refill:  2  . omeprazole (PRILOSEC) 40 MG capsule    Sig: Take 1 capsule (40 mg total) by mouth daily.    Dispense:  90 capsule    Refill:  [place], DO PGY-1, [facility] 04/07/2017 3:35 PM

## 2017-04-07 NOTE — Assessment & Plan Note (Signed)
Too early for A1c. Based on patient's charting of CBGs, low normal and good control. On discussion with patient, will trial off of Victoza and see her back in 1-2 months for repeat A1c. Will do labs at that time.

## 2017-04-10 ENCOUNTER — Telehealth: Payer: Self-pay

## 2017-04-10 NOTE — Telephone Encounter (Signed)
Pt called stating she had procedure done a few weeks ago and pain as not stopped. She is having an achy pain in her left hip going to her knee and it gives away at times. She uses heat/ice and pain level is 5, when walking pain level 7. Please advice.

## 2017-04-25 ENCOUNTER — Encounter: Payer: Self-pay | Admitting: Registered Nurse

## 2017-04-25 ENCOUNTER — Encounter: Payer: BLUE CROSS/BLUE SHIELD | Attending: Physical Medicine & Rehabilitation | Admitting: Registered Nurse

## 2017-04-25 VITALS — BP 106/82 | HR 100 | Ht 64.0 in | Wt 212.0 lb

## 2017-04-25 DIAGNOSIS — M47816 Spondylosis without myelopathy or radiculopathy, lumbar region: Secondary | ICD-10-CM | POA: Insufficient documentation

## 2017-04-25 DIAGNOSIS — M5416 Radiculopathy, lumbar region: Secondary | ICD-10-CM | POA: Diagnosis not present

## 2017-04-25 DIAGNOSIS — M62838 Other muscle spasm: Secondary | ICD-10-CM

## 2017-04-25 DIAGNOSIS — G894 Chronic pain syndrome: Secondary | ICD-10-CM | POA: Diagnosis not present

## 2017-04-25 DIAGNOSIS — Z5181 Encounter for therapeutic drug level monitoring: Secondary | ICD-10-CM

## 2017-04-25 DIAGNOSIS — Z79899 Other long term (current) drug therapy: Secondary | ICD-10-CM

## 2017-04-25 DIAGNOSIS — M961 Postlaminectomy syndrome, not elsewhere classified: Secondary | ICD-10-CM | POA: Diagnosis not present

## 2017-04-25 DIAGNOSIS — M7062 Trochanteric bursitis, left hip: Secondary | ICD-10-CM | POA: Diagnosis not present

## 2017-04-25 DIAGNOSIS — M7061 Trochanteric bursitis, right hip: Secondary | ICD-10-CM | POA: Diagnosis not present

## 2017-04-25 MED ORDER — GABAPENTIN 100 MG PO CAPS: 100.0000 mg | ORAL_CAPSULE | Freq: Three times a day (TID) | ORAL | 3 refills | Status: DC

## 2017-04-25 MED ORDER — OXYCODONE HCL 10 MG PO TABS: 10.0000 mg | ORAL_TABLET | Freq: Three times a day (TID) | ORAL | 0 refills | Status: DC | PRN

## 2017-04-25 NOTE — Patient Instructions (Addendum)
Start Gabapentin Tonight one capsule at bedtime Only for the next 4 nights  On Saturday 04/ 27 /2019, if pain persits : Take Gabapentin One capsule in the Mornin and one capsule at bedtime   Call Office to evaluate medication change on Friday 05/05/2017  [phone]

## 2017-04-25 NOTE — Progress Notes (Signed)
Subjective:    Patient ID: Christine Bradshaw, female    DOB: 1965-06-19, 52 y.o.   MRN: [id number]  HPI: Christine Bradshaw is a 52 year old female who returns for follow up appointment for chronic pain and medication refill. She states her pain is located in her lower back radiating into her left buttock and left lower extremity, bilateral hip pain L>R. Also reports her pain has increase in intensity since her ESI, she's not receiving any relief in her pain. Also complaining of neuropathic pain, we will prescribe gabapentin and instructions given. Oxycodone dosage increased this month, will evaluate next month, she realizes the goal will be to decrease to 7.5 mg every 8 hours as needed, she verbalizes understanding.   S/P ESI with no relief noted.   Christine Bradshaw Morphine Equivalent is 30.38 MME. She is also prescribed Clonazepam by Beryle Lathe..We have discussed the black box warning of using opioids and benzodiazepines. I highlighted the dangers of using these drugs together and discussed the adverse events including respiratory suppression, overdose, cognitive impairment and importance of  compliance with current regimen. She verbalizes understanding, we will continue to monitor and adjust as indicated.   She is being closely monitored and under the care of her/him psychiatrist Dr. Caprice Beaver.  Last UDS was Performed on 01/12/2017, it was consistent.   Pain Inventory Average Pain 6 Pain Right Now 6 My pain is sharp, burning and aching  In the last 24 hours, has pain interfered with the following? General activity 6 Relation with others 5 Enjoyment of life 7 What TIME of day is your pain at its worst? daytime Sleep (in general) Good  Pain is worse with: walking, bending, standing and some activites Pain improves with: rest, heat/ice, medication and injections Relief from Meds: 7  Mobility walk without assistance walk with assistance use a walker ability to climb steps?   yes do you drive?  no  Function I need assistance with the following:  meal prep, household duties and shopping  Neuro/Psych numbness tingling spasms depression anxiety  Prior Studies Any changes since last visit?  no  Physicians involved in your care Any changes since last visit?  no   Family History  Problem Relation Age of Onset  . Hyperlipidemia Father   . Hypertension Father   . Heart disease Father   . Colon polyps Neg Hx   . Colon cancer Neg Hx    Social History   Socioeconomic History  . Marital status: Married    Spouse name: Not on file  . Number of children: 2  . Years of education: Not on file  . Highest education level: Not on file  Occupational History  . Occupation: disability  Social Needs  . Financial resource strain: Not on file  . Food insecurity:    Worry: Not on file    Inability: Not on file  . Transportation needs:    Medical: Not on file    Non-medical: Not on file  Tobacco Use  . Smoking status: Current Every Day Smoker    Types: Cigarettes  . Smokeless tobacco: Never Used  . Tobacco comment: working on quitting.  Down to 3 cigs/day 06/08/16  Substance and Sexual Activity  . Alcohol use: No    Alcohol/week: 0.0 oz  . Drug use: No  . Sexual activity: Yes    Partners: Male  Lifestyle  . Physical activity:    Days per week: Not on file    Minutes  per session: Not on file  . Stress: Not on file  Relationships  . Social connections:    Talks on phone: Not on file    Gets together: Not on file    Attends religious service: Not on file    Active member of club or organization: Not on file    Attends meetings of clubs or organizations: Not on file    Relationship status: Not on file  Other Topics Concern  . Not on file  Social History Narrative   Husband on disability   Pt also on disability   Past Surgical History:  Procedure Laterality Date  . ABDOMINAL HYSTERECTOMY    . APPENDECTOMY    . CHOLECYSTECTOMY    .  COLONOSCOPY W/ BIOPSIES  2010   adenomatous polyp repeat 2015  . COLONOSCOPY WITH PROPOFOL N/A 03/13/2013   Procedure: COLONOSCOPY WITH PROPOFOL;  Surgeon: Lafayette Dragon, MD;  Location: [facility];  Service: Endoscopy;  Laterality: N/A;  . ESOPHAGOGASTRODUODENOSCOPY  2010   showed erosions  . LUMBAR FUSION  2011   L4-L5-S1 Dr. Marcial Pacas  . TONSILLECTOMY    . UPPER GASTROINTESTINAL ENDOSCOPY  2017   Past Medical History:  Diagnosis Date  . Anemia   . Arthritis   . Back pain, chronic   . Complication of anesthesia    manic episode  . Depression   . Diabetes mellitus type II   . GERD (gastroesophageal reflux disease)   . H/O hiatal hernia   . HEADACHE, CHRONIC 04/21/2008  . HX OF GALLSTONE 04/21/2008   Qualifier: Diagnosis of  By: Julaine Hua CMA ([place]), Estill Bamberg    . Hyperlipidemia   . Hypertension   . Insomnia    03-08-13"states is in control"  . Irritable bowel syndrome   . Memory loss of unknown cause   . Panic attack as reaction to stress   . Seizures ([place])    Passes out frequently, but not seizures per pt  . Syncope    Pt states happens once per week approximately  . Tremor    There were no vitals taken for this visit.  Opioid Risk Score:   Fall Risk Score:  `1  Depression screen PHQ 2/9  Depression screen [facility] 2/9 04/07/2017 02/10/2017 01/27/2017 01/12/2017 11/07/2016 09/22/2016 06/08/2016  Decreased Interest 1 2 0 0 0 0 0  Down, Depressed, Hopeless 1 2 0 0 0 0 0  PHQ - 2 Score 2 4 0 0 0 0 0  Altered sleeping - - - - - - -  Tired, decreased energy - - - - - - -  Change in appetite - - - - - - -  Feeling bad or failure about yourself  - - - - - - -  Trouble concentrating - - - - - - -  Moving slowly or fidgety/restless - - - - - - -  Suicidal thoughts - - - - - - -  PHQ-9 Score - - - - - - -  Difficult doing work/chores - - - - - - -  Some recent data might be hidden     Review of Systems  Constitutional: Negative.   HENT: Negative.   Eyes: Negative.   Respiratory:  Negative.   Cardiovascular: Positive for leg swelling.  Gastrointestinal: Negative.   Endocrine: Negative.   Genitourinary: Negative.   Musculoskeletal: Positive for arthralgias, back pain, gait problem, joint swelling and myalgias.  Skin: Negative.   Allergic/Immunologic: Negative.   Neurological: Positive for numbness.  Hematological:  Negative.   Psychiatric/Behavioral: Positive for dysphoric mood. The patient is nervous/anxious.   All other systems reviewed and are negative.      Objective:   Physical Exam  Constitutional: She is oriented to person, place, and time. She appears well-developed and well-nourished.  HENT:  Head: Normocephalic and atraumatic.  Neck: Normal range of motion. Neck supple.  Cardiovascular: Normal rate and regular rhythm.  Pulmonary/Chest: Effort normal and breath sounds normal.  Musculoskeletal:  Normal Muscle Bulk and Muscle Testing Reveals: Upper Extremities: Full ROM and Muscle Strength 5/5 Lumbar Hypersensitivity Sacral Tenderness Bilateral Greater Trochanter Tenderness Lower Extremities: Full ROM and Muscle Strength 5/5 Left Lower Extremity Flexion Produces Pain into Left Hip Arises from Table Slowly Antalgic Gait  Neurological: She is alert and oriented to person, place, and time.  Skin: Skin is warm and dry.  Psychiatric: She has a normal mood and affect.  Nursing note and vitals reviewed.         Assessment & Plan:  1. Lumbar postlaminectomy syndrome- status post L4-5 and L5-S1 fusion with chronic postoperative pain as well as a chronic left L5 radiculopathy. RX: Gabapentin Instructions Given, she verbalizes underestanding. 04/25/2017 RX: Increased  Oxycodone10 mg one tablet every 8 hours #90. We will continue the opioid monitoring program, this consists of regular clinic visits, examinations, urine drug screen, pill counts as well as use of [place] Controlled Substance Reporting System. Continue with heat and exercise  therapy. 2. Lumbar spondylosis above the level of the fusion: Continue HEP as tolerated. Continue to Monitor.04/25/2017 3. Bilateral Greater Trochanteric Bursitis Continue with heat, Ice therapy and HEP as tolerated. 04/25/2017 4. Muscle Spasms: Continue with current treatment with Robaxin as needed. 04/25/2017  20 minutes of face to face patient care time was spent during this visit. All questions were encouraged and answered.  F/U in 1 month.

## 2017-04-28 ENCOUNTER — Telehealth: Payer: Self-pay

## 2017-04-28 NOTE — Telephone Encounter (Signed)
Patient called, stated that the recent change in medication is not working for her, states is still in pain, states was told to call Zella Ball today to inform her of medication status

## 2017-05-01 NOTE — Telephone Encounter (Signed)
Return Ms. Lesperance call, she reports increase intensity of lower back pain radiating into her left buttock and left lower extremity. She was prescribed gabapentin with no relief from he neuropathic pain. We will increase her gabapentin dosage to 100 mg capsule to TID, she verbalizes understanding. Instructed to call office on Thursday or Friday 05/04/2017 or 05/05/2017, she verbalizes understanding.

## 2017-05-05 ENCOUNTER — Other Ambulatory Visit: Payer: Self-pay

## 2017-05-05 ENCOUNTER — Ambulatory Visit: Payer: BLUE CROSS/BLUE SHIELD | Admitting: Student in an Organized Health Care Education/Training Program

## 2017-05-05 ENCOUNTER — Encounter: Payer: Self-pay | Admitting: Student in an Organized Health Care Education/Training Program

## 2017-05-05 ENCOUNTER — Telehealth: Payer: Self-pay

## 2017-05-05 VITALS — BP 98/68 | HR 88 | Temp 98.1°F | Ht 64.0 in | Wt 211.2 lb

## 2017-05-05 DIAGNOSIS — R42 Dizziness and giddiness: Secondary | ICD-10-CM | POA: Diagnosis not present

## 2017-05-05 DIAGNOSIS — J069 Acute upper respiratory infection, unspecified: Secondary | ICD-10-CM | POA: Diagnosis not present

## 2017-05-05 DIAGNOSIS — J018 Other acute sinusitis: Secondary | ICD-10-CM | POA: Diagnosis not present

## 2017-05-05 MED ORDER — AMOXICILLIN 500 MG PO TABS
500.0000 mg | ORAL_TABLET | Freq: Two times a day (BID) | ORAL | 0 refills | Status: DC
Start: 2017-05-05 — End: 2017-06-12

## 2017-05-05 MED ORDER — NORTRIPTYLINE HCL 10 MG PO CAPS: 10.0000 mg | ORAL_CAPSULE | Freq: Every day | ORAL | 0 refills | Status: DC

## 2017-05-05 NOTE — Progress Notes (Signed)
Subjective:    Christine Bradshaw - 52 y.o. female MRN [id number]  Date of birth: 12/21/1965  HPI  [name] is here for a same day appointment for URI.  URI Major symptoms: rhinorrhea, congestion with green mucous, ears plugged up Has been sick for 5 days. Progression: Seems to be improving Medications tried: Tried nyquil at home, which helped  Sick contacts: Daughter is sick at home  Symptoms Fever: One day 102F under the tongue, 2 days ago Headache or face pain: +sinus pain Tooth pain: none Sneezing: none Scratchy throat: none Allergies: does have seasonal allergies, takes allegra and nose spray Muscle aches: yes Severe fatigue: some fatigue Stiff neck: none Shortness of breath: none Rash: none Sore throat or swollen glands: +swollen glands  Dizziness - Described as light headedness. Worst when she goes from sitting to standing. Patient reports that this is a chronic issue. She reports that previously her water pill and blood pressure medications were decreased. Patient reports that she does use her oxycodone for chronic pain, prescribed q8H. She does not have the sensation that the room is spinning. She does not have any shortness of breath, chest pain, or syncopal episodes.   ROS see HPI Smoking Status noted  Health Maintenance:   Health Maintenance Due  Topic Date Due  . PNEUMOCOCCAL POLYSACCHARIDE VACCINE (2) 01/04/2015    -  reports that she has been smoking cigarettes.  She has never used smokeless tobacco. - Review of Systems: Per HPI. - Past Medical History: Patient Active Problem List   Diagnosis Date Noted  . Acute upper respiratory infection 05/08/2017  . Light headedness 05/08/2017  . Gastroesophageal reflux disease 11/07/2016  . Iron deficiency anemia 05/15/2015  . Trochanteric bursitis of both hips 05/11/2015  . Constipation 09/15/2014  . Lumbar post-laminectomy syndrome 07/03/2014  . Altered awareness, transient 04/02/2014  .  Seizure-like activity ([place]) 03/05/2014  . Spondylosis of lumbar region without myelopathy or radiculopathy 10/15/2013  . Tobacco abuse 04/25/2011  . Skin lesion of left leg 04/21/2011  . Venous insufficiency 03/11/2010  . Diabetic neuropathy, painful ([place]) 03/10/2010  . BIPOLAR DISORDER UNSPECIFIED 06/05/2009  . Backache 06/05/2009  . Obesity, morbid, BMI 50 or higher ([place]) 04/21/2008  . ANXIETY 04/21/2008  . Essential hypertension 04/21/2008  . Diabetes mellitus, type II, insulin dependent ([place]) 04/17/2008  . Hyperlipidemia 04/17/2008  . Irritable bowel syndrome 04/17/2008  . COLONIC POLYPS, HYPERPLASTIC, HX OF 04/17/2008   - Medications: reviewed and updated Current Outpatient Medications  Medication Sig Dispense Refill  . amoxicillin (AMOXIL) 500 MG tablet Take 1 tablet (500 mg total) by mouth 2 (two) times daily. 14 tablet 0  . aspirin EC 81 MG tablet Take 81 mg by mouth every morning.    . clonazePAM (KLONOPIN) 1 MG tablet Take 1 mg by mouth 3 (three) times daily.    . DULoxetine (CYMBALTA) 60 MG capsule Take 120 mg by mouth every morning.    . ferrous sulfate 325 (65 FE) MG tablet Take 1 tablet (325 mg total) by mouth 3 (three) times daily with meals. 90 tablet 3  . furosemide (LASIX) 20 MG tablet Take 1 tablet (20 mg total) by mouth daily. Take 1 tablet by mouth daily as needed for fluid. 90 tablet 1  . ipratropium (ATROVENT) 0.03 % nasal spray PLACE 2 SPRAYS INTO BOTH NOSTRILS Q 12 HOURS 30 mL 4  . ketoconazole (NIZORAL) 2 % cream Apply 1 application topically daily. 15 g 0  . lamoTRIgine (LAMICTAL) 200  MG tablet TK 1 T PO BID  2  . liraglutide (VICTOZA) 18 MG/3ML SOPN Inject 0.2 mLs (1.2 mg total) into the skin daily. 3 mL 2  . Magnesium Citrate 125 MG CAPS Take 400 mg by mouth 2 (two) times daily as needed.    . meloxicam (MOBIC) 7.5 MG tablet Take 1 tablet (7.5 mg total) by mouth daily. 30 tablet 0  . metFORMIN (GLUCOPHAGE) 1000 MG tablet Take 1 tablet (1,000 mg total) by  mouth 2 (two) times daily with a meal. 180 tablet 2  . methocarbamol (ROBAXIN) 500 MG tablet TAKE 1 TABLET(500 MG) BY MOUTH EVERY 8 HOURS AS NEEDED 90 tablet 2  . naloxegol oxalate (MOVANTIK) 25 MG TABS tablet Take 1 tablet (25 mg total) by mouth daily. 30 tablet 1  . nortriptyline (PAMELOR) 10 MG capsule Take 1 capsule (10 mg total) by mouth at bedtime. 30 capsule 0  . omega-3 acid ethyl esters (LOVAZA) 1 g capsule TAKE 2 CAPSULES BY MOUTH TWICE DAILY. GENERIC EQUIVALENT FOR LOVAZA 360 capsule 0  . omeprazole (PRILOSEC) 40 MG capsule Take 1 capsule (40 mg total) by mouth daily. 90 capsule 1  . Oxycodone HCl 10 MG TABS Take 1 tablet (10 mg total) by mouth every 8 (eight) hours as needed. 90 tablet 0  . pravastatin (PRAVACHOL) 40 MG tablet Take 1 tablet (40 mg total) by mouth every evening. 90 tablet 2  . quinapril (ACCUPRIL) 5 MG tablet Take 1 tablet (5 mg total) by mouth daily. 90 tablet 0  . risperidone (RISPERDAL) 4 MG tablet Take 8 mg by mouth daily.    . traZODone (DESYREL) 100 MG tablet Take 100 mg by mouth at bedtime.       No current facility-administered medications for this visit.     Review of Systems See HPI     Objective:   Physical Exam BP 98/68   Pulse 88   Temp 98.1 F (36.7 C) (Oral)   Ht 5\' 4"  (1.626 m)   Wt 211 lb 3.2 oz (95.8 kg)   SpO2 99%   BMI 36.25 kg/m  Gen: NAD, alert, cooperative with exam, chronically ill-appearing HEENT: NCAT, PERRL, clear conjunctiva, oropharynx +erythema without exudate, +mild ttp over frontal and maxoid sinuses, supple neck CV: RRR, good S1/S2, no murmur, no edema, capillary refill brisk  Resp: CTABL, no wheezes, non-labored Abd: SNTND, BS present, no guarding or organomegaly Skin: no rashes, normal turgor  Neuro: no gross deficits.  Psych: good insight, alert and oriented     Assessment & Plan:   Acute upper respiratory infection 5 days of URI symptoms including reported fevers to 102F. No hx of lung disease. Uncertain  whether this is a sinusitis vs viral URI.  - encourage continued supportive care with OTC meds - encourage hydration - gave script for amoxicillin to be used if symptoms do not begin to improve over next 2 days - amoxicillin (AMOXIL) 500 MG tablet; Take 1 tablet (500 mg total) by mouth 2 (two) times daily.  Dispense: 14 tablet; Refill: 0   Light headedness Intermittent, seems orthostatic in nature. Patient's BP 98/68 at today's visit, though in setting of acute illness. Reviewed med list, patient states she was previously taken off of her antihypertensive medications. There are other medications that may contribute to hypotension including chronic opioids. No syncopal episodes reported. - encourage patient to follow up with PCP once this acute illness has resolved for further med management - encouraged her to space oxycodone as much  as possible as this may contribute to lower blood pressures   No orders of the defined types were placed in this encounter.   Meds ordered this encounter  Medications  . amoxicillin (AMOXIL) 500 MG tablet    Sig: Take 1 tablet (500 mg total) by mouth 2 (two) times daily.    Dispense:  14 tablet    Refill:  0    Everrett Coombe, MD,MS,  PGY2 05/08/2017 8:36 AM

## 2017-05-05 NOTE — Telephone Encounter (Signed)
Return Christine Bradshaw call, she reports while taking the gabapentin she was experiencing urinary frequency. She has stopped the gabapentin. Also reports her neuropathic pain has increased in intensity, we will start with low dose Pamelor, if she develops any reaction she was instructed to go to he emergency room, she verbalizes understanding. Also instructed to call office on Monday 05/06/ 2019 to evaluate medication change, she verbalizes understanding.   We discussed different treatment modality such as Nucnta, she will think about it over the weekend, she states.

## 2017-05-05 NOTE — Patient Instructions (Addendum)
It was a pleasure seeing you today in our clinic. Here is the treatment plan we have discussed and agreed upon together:  Please continue to use nyquil, mucinex, and your nasal spray for supportive care. If your symptoms do not improve over the next two days, I sent a script for amoxicillin to your pharmacy which you can fill.  Our clinic's number is [phone]. Please call with questions or concerns about what we discussed today.  Be well, Dr. Burr Medico

## 2017-05-05 NOTE — Telephone Encounter (Signed)
Patient called today reporting that the increase in gabapentin is not helping with her pain and is also causing a negative reaction of bladder incontinence as well, advised her to stop taking medication and I will forward off to eunice this message to see what can be done for her.

## 2017-05-08 DIAGNOSIS — R42 Dizziness and giddiness: Secondary | ICD-10-CM | POA: Insufficient documentation

## 2017-05-08 DIAGNOSIS — J069 Acute upper respiratory infection, unspecified: Secondary | ICD-10-CM | POA: Insufficient documentation

## 2017-05-08 NOTE — Assessment & Plan Note (Signed)
Intermittent, seems orthostatic in nature. Patient's BP 98/68 at today's visit, though in setting of acute illness. Reviewed med list, patient states she was previously taken off of her antihypertensive medications. There are other medications that may contribute to hypotension including chronic opioids. No syncopal episodes reported. - encourage patient to follow up with PCP once this acute illness has resolved for further med management - encouraged her to space oxycodone as much as possible as this may contribute to lower blood pressures

## 2017-05-08 NOTE — Assessment & Plan Note (Signed)
5 days of URI symptoms including reported fevers to 102F. No hx of lung disease. Uncertain whether this is a sinusitis vs viral URI.  - encourage continued supportive care with OTC meds - encourage hydration - gave script for amoxicillin to be used if symptoms do not begin to improve over next 2 days - amoxicillin (AMOXIL) 500 MG tablet; Take 1 tablet (500 mg total) by mouth 2 (two) times daily.  Dispense: 14 tablet; Refill: 0

## 2017-05-10 ENCOUNTER — Telehealth: Payer: Self-pay

## 2017-05-10 NOTE — Telephone Encounter (Signed)
Return Christine Bradshaw call, no answer. Left message to return the call.

## 2017-05-10 NOTE — Telephone Encounter (Signed)
Pt called stating she was given a new medication at last visit and it is adjusting to her body well.

## 2017-05-16 ENCOUNTER — Telehealth: Payer: Self-pay

## 2017-05-16 NOTE — Telephone Encounter (Signed)
Patient called again today, requesting a call back from Stephan.

## 2017-05-16 NOTE — Telephone Encounter (Signed)
Patient called yesterday stating that when she takes her new medication she is taking it at night and it works only until [employer].  Requesting advise on what she can do to make them last longer.

## 2017-05-16 NOTE — Telephone Encounter (Signed)
Return Ms. Teasdale call, we will increase Nortriptyline to two capsules at bedtime. She was instructed to call office on Friday on 05/17/2017, to evaluate medication change.

## 2017-05-19 ENCOUNTER — Telehealth: Payer: Self-pay

## 2017-05-19 NOTE — Telephone Encounter (Signed)
Pt called stating her medication is doing fine, she feels much better.

## 2017-05-22 NOTE — Progress Notes (Signed)
Subjective:   Patient ID: Christine Bradshaw    DOB: February 19, 1965, 52 y.o. female   MRN: [id number]  Christine Bradshaw is a 52 y.o. female with a history of HTN, DM2, obesity here for   Lightheadedness Patient states she has been having lightheadedness and dizzy spells for the past 6 weeks with recent worsening the past few weeks and is starting to have frequent falls. She has fallen on average 5 times per day. Yesterday noted to have been walking to the living room from her bathroom and fell in the hallway hitting her head on the carpet. She checks her blood sugars TID with the lowest noted at 78, otherwise have been 90-100s. She was seen for a sinus infection 5/3 but did not take the antibiotics prescribed as she began to feel better (as instructed). She denies any recent medication changes other than an increase in her oxycodone dose from 7.5mg  to 10mg  2 months ago to which she does not attribute her dizziness. She denies vision changes, blackout spells, tripping on objects, night sweats, weight loss, chest pain, headaches. Her husband reports she has a history of "silent seizures" where she will be talking and interactive then all of a sudden she will go limp and become unresponsive. The episodes lasts 3-4 min and does not have bowel or bladder incontinence, post ictal state but states she is sometimes tired after these episodes. She has not had any of these episodes in months and her current dizziness and falling are not the same as her "seizure" history. She has seen a neurologist in the past with previous EEG but did not show any seizure activity per patient.  Diabetes, Type 2 - Last A1c 5.0 01/2017 - Medications: Metformin 1000mg , trialled off Victoza at last visit - Compliance: good - Checking BG at home: yes, mainly 90-100s, lowest 78  Review of Systems:  Per HPI.   [place]: reviewed. Smoking status reviewed. Medications reviewed.  Objective:   BP 100/62   Pulse 83   Temp 98.6 F (37  C) (Oral)   Ht 5\' 4"  (1.626 m)   Wt 210 lb (95.3 kg)   SpO2 98%   BMI 36.05 kg/m  Vitals and nursing note reviewed.  General: well developed, obese female in no acute distress with non-toxic appearance HEENT: normocephalic, atraumatic, moist mucous membranes CV: regular rate and rhythm without murmurs, rubs, or gallops, No carotid bruits. Lungs: clear to auscultation bilaterally with normal work of breathing Extremities: warm and well perfused, normal tone MSK: ROM grossly intact, strength 5/5 to U/LE bilaterally, slow and unsteady gait.  Trace LE edema.  Neuro: Alert and oriented, speech normal.  Romberg negative.  Optic field with decreased acuity in L upper/outer quadrant. PERRL, Extraocular movements intact.  Intact symmetric sensation to light touch of face and extremities bilaterally.  Hearing grossly intact bilaterally.  Tongue protrudes normally with no deviation.  Shoulder shrug, smile symmetric. Finger to nose slowed but normal.   Assessment & Plan:   Light headedness Worsening despite blood pressure and diabetes medication optimization. 100/68 today. Now reporting frequent falls. No focal deficit noted on neuro exam to warrant brain imaging. Also no red flags from history. Continues to seem consistent with orthostatic etiology. Is on chronic opioid, muscle relaxer, mood stabilizer that can all contribute to hypotension. Provided patient with letter to give to psychiatry and PM&R to request medication optimization as she has appointments tomorrow. If this is achieved and continues to have falling episodes, consider neurology referral  or brain imaging. Will also check CBC given history of anemia. Instructed patient to not drive until episodes resolve and to have someone with her at all times. If symptoms worsen or has traumatic fall, go to the ED.  Diabetes mellitus, type II, insulin dependent (HCC) A1c 5.2 today. Will decrease Metformin to 500mg  daily. Recheck A1c in 6  months.  Orders Placed This Encounter  Procedures  . CBC  . Comprehensive metabolic panel  . POCT glycosylated hemoglobin (Hb A1C)   Meds ordered this encounter  Medications  . metFORMIN (GLUCOPHAGE) 500 MG tablet    Sig: Take 1 tablet (500 mg total) by mouth daily with breakfast.    Dispense:  90 tablet    Refill:  2  . DISCONTD: furosemide (LASIX) 20 MG tablet    Sig: Take 1 tablet (20 mg total) by mouth daily. Take 0.5 tablet by mouth daily as needed for edema or weight gain    Dispense:  90 tablet    Refill:  [place], DO PGY-1, [facility] 05/24/2017 8:28 PM

## 2017-05-24 ENCOUNTER — Encounter: Payer: Self-pay | Admitting: Family Medicine

## 2017-05-24 ENCOUNTER — Ambulatory Visit: Payer: BLUE CROSS/BLUE SHIELD | Admitting: Family Medicine

## 2017-05-24 ENCOUNTER — Other Ambulatory Visit: Payer: Self-pay

## 2017-05-24 VITALS — BP 100/62 | HR 83 | Temp 98.6°F | Ht 64.0 in | Wt 210.0 lb

## 2017-05-24 DIAGNOSIS — Z794 Long term (current) use of insulin: Secondary | ICD-10-CM | POA: Diagnosis not present

## 2017-05-24 DIAGNOSIS — E119 Type 2 diabetes mellitus without complications: Secondary | ICD-10-CM | POA: Diagnosis not present

## 2017-05-24 DIAGNOSIS — R42 Dizziness and giddiness: Secondary | ICD-10-CM

## 2017-05-24 LAB — POCT GLYCOSYLATED HEMOGLOBIN (HGB A1C): HBA1C, POC (CONTROLLED DIABETIC RANGE): 5.2 % (ref 0.0–7.0)

## 2017-05-24 MED ORDER — METFORMIN HCL 500 MG PO TABS
500.0000 mg | ORAL_TABLET | Freq: Every day | ORAL | 2 refills | Status: AC
Start: 2017-05-24 — End: 2017-08-22

## 2017-05-24 MED ORDER — FUROSEMIDE 20 MG PO TABS: 20.0000 mg | ORAL_TABLET | Freq: Every day | ORAL | 1 refills | Status: DC

## 2017-05-24 NOTE — Assessment & Plan Note (Signed)
A1c 5.2 today. Will decrease Metformin to 500mg  daily. Recheck A1c in 6 months.

## 2017-05-24 NOTE — Patient Instructions (Addendum)
It was great to see you!  For your lightheadedness,  - I am VERY concerned about your lightheadedness and falling. We are working to optimize your medication regimen to reduce your risk of falling. - When you go to your Physical Medicine doctor and your Psychiatrist, PLEASE mention your lightheadedness and falling as you are on many medications that be sedating and can contribute to falling. - I will write a letter for you to give to them. - You should NOT drive or operate machinery, climb ladders until we are able to get you more stabilized. - It is important to have someone with you ideally at all times. - We are decreasing your metformin dose to 500mg  daily. - I would like you to follow up in 2-3 weeks after you have seen your other doctors.  We are checking some labs today, we will call you or send you a letter if they are abnormal.   Take care and seek immediate care sooner if you develop any concerns.   Dr. [facility]

## 2017-05-24 NOTE — Assessment & Plan Note (Addendum)
Worsening despite blood pressure and diabetes medication optimization. 100/68 today. Now reporting frequent falls. No focal deficit noted on neuro exam to warrant brain imaging. Also no red flags from history. Continues to seem consistent with orthostatic etiology. Is on chronic opioid, muscle relaxer, mood stabilizer that can all contribute to hypotension. Provided patient with letter to give to psychiatry and PM&R to request medication optimization as she has appointments tomorrow. If this is achieved and continues to have falling episodes, consider neurology referral or brain imaging. Will also check CBC given history of anemia. Instructed patient to not drive until episodes resolve and to have someone with her at all times. If symptoms worsen or has traumatic fall, go to the ED.

## 2017-05-25 ENCOUNTER — Encounter: Payer: BLUE CROSS/BLUE SHIELD | Admitting: Registered Nurse

## 2017-05-25 ENCOUNTER — Encounter: Payer: Self-pay | Admitting: Registered Nurse

## 2017-05-25 ENCOUNTER — Other Ambulatory Visit: Payer: Self-pay | Admitting: Registered Nurse

## 2017-05-25 ENCOUNTER — Encounter: Payer: BLUE CROSS/BLUE SHIELD | Attending: Physical Medicine & Rehabilitation | Admitting: Registered Nurse

## 2017-05-25 VITALS — BP 117/84 | HR 82 | Ht 64.0 in | Wt 213.0 lb

## 2017-05-25 DIAGNOSIS — M961 Postlaminectomy syndrome, not elsewhere classified: Secondary | ICD-10-CM | POA: Diagnosis not present

## 2017-05-25 DIAGNOSIS — G894 Chronic pain syndrome: Secondary | ICD-10-CM | POA: Diagnosis not present

## 2017-05-25 DIAGNOSIS — M47816 Spondylosis without myelopathy or radiculopathy, lumbar region: Secondary | ICD-10-CM | POA: Diagnosis not present

## 2017-05-25 DIAGNOSIS — M5416 Radiculopathy, lumbar region: Secondary | ICD-10-CM

## 2017-05-25 DIAGNOSIS — Z5181 Encounter for therapeutic drug level monitoring: Secondary | ICD-10-CM

## 2017-05-25 DIAGNOSIS — Z79891 Long term (current) use of opiate analgesic: Secondary | ICD-10-CM | POA: Diagnosis not present

## 2017-05-25 LAB — CBC
Hematocrit: 41.1 % (ref 34.0–46.6)
Hemoglobin: 13.5 g/dL (ref 11.1–15.9)
MCH: 29.5 pg (ref 26.6–33.0)
MCHC: 32.8 g/dL (ref 31.5–35.7)
MCV: 90 fL (ref 79–97)
PLATELETS: 349 10*3/uL (ref 150–450)
RBC: 4.58 x10E6/uL (ref 3.77–5.28)
RDW: 14.9 % (ref 12.3–15.4)
WBC: 9.3 10*3/uL (ref 3.4–10.8)

## 2017-05-25 LAB — COMPREHENSIVE METABOLIC PANEL
A/G RATIO: 2 (ref 1.2–2.2)
ALBUMIN: 4.2 g/dL (ref 3.5–5.5)
ALT: 10 IU/L (ref 0–32)
AST: 21 IU/L (ref 0–40)
Alkaline Phosphatase: 69 IU/L (ref 39–117)
BUN/Creatinine Ratio: 12 (ref 9–23)
BUN: 11 mg/dL (ref 6–24)
CO2: 22 mmol/L (ref 20–29)
Calcium: 9.5 mg/dL (ref 8.7–10.2)
Chloride: 100 mmol/L (ref 96–106)
Creatinine, Ser: 0.93 mg/dL (ref 0.57–1.00)
GFR calc Af Amer: 81 mL/min/{1.73_m2} (ref >59)
GFR, EST NON AFRICAN AMERICAN: 70 mL/min/{1.73_m2} (ref >59)
GLUCOSE: 75 mg/dL (ref 65–99)
Globulin, Total: 2.1 g/dL (ref 1.5–4.5)
POTASSIUM: 5 mmol/L (ref 3.5–5.2)
SODIUM: 139 mmol/L (ref 134–144)
TOTAL PROTEIN: 6.3 g/dL (ref 6.0–8.5)

## 2017-05-25 MED ORDER — TOPIRAMATE 25 MG PO TABS: 25.0000 mg | ORAL_TABLET | Freq: Two times a day (BID) | ORAL | 2 refills | Status: DC

## 2017-05-25 MED ORDER — OXYCODONE HCL 10 MG PO TABS: 10.0000 mg | ORAL_TABLET | Freq: Three times a day (TID) | ORAL | 0 refills | Status: DC | PRN

## 2017-05-25 NOTE — Progress Notes (Signed)
Subjective:    Patient ID: Christine Bradshaw, female    DOB: Jun 13, 1965, 52 y.o.   MRN: [id number]  HPI:  [name] is a 52 year old female who returns for follow up appointment for chronic pain and medication refill. She states her pain is located in her mid-back. Also reports decreased radicular pain with Pamelor. She rates her pain 6. Her current exercise regime is walking and performing stretching exercises.  [name] is 45.00 MME. She is also prescribed Clonazepam by Beryle Lathe.We have discussed the black box warning of using opioids and benzodiazepines. I highlighted the dangers of using these drugs together and discussed the adverse events including respiratory suppression, overdose, cognitive impairment and importance of compliance with current regimen. We will continue to monitor and adjust as indicated.  She is being closely monitored and under the care of her  Psychiatrist Dr. Caprice Beaver.   Received a letter from [name] PCP Dr Ky Barban regarding her lightheadedness and dizziness for the past few months. Also stated Ms, Christine Bradshaw dizziness seems to be orthostatic in nature and she had fallen several times. Would like for the provider to review medications.  Christine Bradshaw reports since being on the Pamelor her neuropathic pain has been controlled and she has been having dizziness > than 6 months. Discussed the adverse reactions of medication and her recent fall two days ago, she was standing in her dining room and fell backwards. Christine Bradshaw will be discontinued in view of the above. She was prescribe Topamax with instructions. Also reports her psychiatrist decreased her Clonazepam to 0.5mg . The above discussed with Dr. Letta Bradshaw and he agrees with plan.     Pain Inventory Average Pain 5 Pain Right Now 6 My pain is sharp, burning and stabbing  In the last 24 hours, has pain interfered with the following? General activity  6 Relation with others 5 Enjoyment of life 7 What TIME of day is your pain at its worst? daytime Sleep (in general) Good  Pain is worse with: walking, bending, standing and some activites Pain improves with: rest and heat/ice Relief from Meds: 5  Mobility walk without assistance use a walker ability to climb steps?  yes do you drive?  no use a wheelchair  Function Do you have any goals in this area?  yes  Neuro/Psych spasms dizziness depression anxiety  Prior Studies Any changes since last visit?  no  Physicians involved in your care Any changes since last visit?  no   Family History  Problem Relation Age of Onset  . Hyperlipidemia Father   . Hypertension Father   . Heart disease Father   . Colon polyps Neg Hx   . Colon cancer Neg Hx    Social History   Socioeconomic History  . Marital status: Married    Spouse name: Not on file  . Number of children: 2  . Years of education: Not on file  . Highest education level: Not on file  Occupational History  . Occupation: disability  Social Needs  . Financial resource strain: Not on file  . Food insecurity:    Worry: Not on file    Inability: Not on file  . Transportation needs:    Medical: Not on file    Non-medical: Not on file  Tobacco Use  . Smoking status: Current Every Day Smoker    Packs/day: 1.00    Types: Cigarettes  . Smokeless tobacco: Never Used  Substance and Sexual Activity  .  Alcohol use: No    Alcohol/week: 0.0 oz  . Drug use: No  . Sexual activity: Yes    Partners: Male  Lifestyle  . Physical activity:    Days per week: Not on file    Minutes per session: Not on file  . Stress: Not on file  Relationships  . Social connections:    Talks on phone: Not on file    Gets together: Not on file    Attends religious service: Not on file    Active member of club or organization: Not on file    Attends meetings of clubs or organizations: Not on file    Relationship status: Not on file   Other Topics Concern  . Not on file  Social History Narrative   Husband on disability   Pt also on disability   Past Surgical History:  Procedure Laterality Date  . ABDOMINAL HYSTERECTOMY    . APPENDECTOMY    . CHOLECYSTECTOMY    . COLONOSCOPY W/ BIOPSIES  2010   adenomatous polyp repeat 2015  . COLONOSCOPY WITH PROPOFOL N/A 03/13/2013   Procedure: COLONOSCOPY WITH PROPOFOL;  Surgeon: Lafayette Dragon, MD;  Location: [facility];  Service: Endoscopy;  Laterality: N/A;  . ESOPHAGOGASTRODUODENOSCOPY  2010   showed erosions  . LUMBAR FUSION  2011   L4-L5-S1 Dr. Marcial Pacas  . TONSILLECTOMY    . UPPER GASTROINTESTINAL ENDOSCOPY  2017   Past Medical History:  Diagnosis Date  . Anemia   . Arthritis   . Back pain, chronic   . Complication of anesthesia    manic episode  . Depression   . Diabetes mellitus type II   . GERD (gastroesophageal reflux disease)   . H/O hiatal hernia   . HEADACHE, CHRONIC 04/21/2008  . HX OF GALLSTONE 04/21/2008   Qualifier: Diagnosis of  By: Julaine Hua CMA ([place]), Estill Bamberg    . Hyperlipidemia   . Hypertension   . Insomnia    03-08-13"states is in control"  . Irritable bowel syndrome   . Memory loss of unknown cause   . Panic attack as reaction to stress   . Seizures ([place])    Passes out frequently, but not seizures per pt  . Syncope    Pt states happens once per week approximately  . Tremor    BP 117/84   Pulse 82   Ht 5\' 4"  (1.626 m)   Wt 213 lb (96.6 kg)   SpO2 97%   BMI 36.56 kg/m   Opioid Risk Score:   Fall Risk Score:  `1  Depression screen PHQ 2/9  Depression screen [facility] 2/9 05/24/2017 05/05/2017 04/07/2017 02/10/2017 01/27/2017 01/12/2017 11/07/2016  Decreased Interest 0 0 1 2 0 0 0  Down, Depressed, Hopeless 0 0 1 2 0 0 0  PHQ - 2 Score 0 0 2 4 0 0 0  Altered sleeping - - - - - - -  Tired, decreased energy - - - - - - -  Change in appetite - - - - - - -  Feeling bad or failure about yourself  - - - - - - -  Trouble concentrating - - - - - - -   Moving slowly or fidgety/restless - - - - - - -  Suicidal thoughts - - - - - - -  PHQ-9 Score - - - - - - -  Difficult doing work/chores - - - - - - -  Some recent data might be hidden  Review of Systems  Constitutional: Negative.   HENT: Negative.   Eyes: Negative.   Respiratory: Negative.   Cardiovascular: Negative.   Gastrointestinal: Negative.   Endocrine: Negative.   Genitourinary: Negative.   Musculoskeletal: Positive for arthralgias, back pain, gait problem and myalgias.  Skin: Negative.   Allergic/Immunologic: Negative.   Neurological: Positive for dizziness.  Hematological: Negative.   Psychiatric/Behavioral: Positive for dysphoric mood. The patient is nervous/anxious.   All other systems reviewed and are negative.      Objective:   Physical Exam  Constitutional: She is oriented to person, place, and time. She appears well-developed and well-nourished.  HENT:  Head: Normocephalic and atraumatic.  Neck: Normal range of motion. Neck supple.  Cardiovascular: Normal rate and regular rhythm.  Pulmonary/Chest: Effort normal and breath sounds normal.  Musculoskeletal:  Normal Muscle Bulk and Muscle Testing Reveals: Upper Extremities: Full ROM and Muscle Strength 5/5 Lumbar Paraspinal Tenderness: L-4-L-5 Lower Extremities: Full ROM and Muscle Strength 5/5 Arises from Table with ease using walker for support Narrow Based gait  Neurological: She is alert and oriented to person, place, and time.  Skin: Skin is warm and dry.  Psychiatric: She has a normal mood and affect.  Nursing note and vitals reviewed.         Assessment & Plan:  1. Lumbar postlaminectomy syndrome- status post L4-5 and L5-S1 fusion with chronic postoperative pain as well as a chronic left L5 radiculopathy. RX: Topamax and Pamelor discontinued. Instructions Given, she verbalizes underestanding. 05/25/2017 Refilled  Oxycodone10 mg one tablet every 8 hours #90. We will continue the opioid  monitoring program, this consists of regular clinic visits, examinations, urine drug screen, pill counts as well as use of [place] Controlled Substance Reporting System. Continue with heat and exercise therapy. 2. Lumbar spondylosis above the level of the fusion: Continue HEP as tolerated. Continue to Monitor.05/25/2017 3.BilateralGreater Trochanteric BursitisNo complaints today.Continue with heat, Ice therapy and HEP as tolerated. 05/25/2017 4. Muscle Spasms: Continue with current treatment with Robaxin as needed. 05/25/2017  20 minutes of face to face patient care time was spent during this visit. All questions were encouraged and answered.  F/U in 1 month.

## 2017-05-25 NOTE — Patient Instructions (Addendum)
Discontinued the Pamelor/ Nortriptylline   New Medication for Nerve Pain:   Topamax 25 mg : We will start medication at Bedtime Only for a week:   On 06/01/2017  If Nerve Pain Persists You make take One tablet in the Morning and at Bedtime  Call office with any questions or concerns: [phone]

## 2017-05-27 LAB — DRUG TOX MONITOR 1 W/CONF, ORAL FLD
Amphetamines: NEGATIVE ng/mL (ref <10)
BENZODIAZEPINES: NEGATIVE ng/mL (ref <0.50)
BUPRENORPHINE: NEGATIVE ng/mL (ref <0.10)
Barbiturates: NEGATIVE ng/mL (ref <10)
COCAINE: NEGATIVE ng/mL (ref <5.0)
Codeine: NEGATIVE ng/mL (ref <2.5)
DIHYDROCODEINE: NEGATIVE ng/mL (ref <2.5)
FENTANYL: NEGATIVE ng/mL (ref <0.10)
HYDROMORPHONE: NEGATIVE ng/mL (ref <2.5)
Heroin Metabolite: NEGATIVE ng/mL (ref <1.0)
Hydrocodone: NEGATIVE ng/mL (ref <2.5)
MARIJUANA: NEGATIVE ng/mL (ref <2.5)
MDMA: NEGATIVE ng/mL (ref <10)
MEPROBAMATE: NEGATIVE ng/mL (ref <2.5)
METHADONE: NEGATIVE ng/mL (ref <5.0)
MORPHINE: NEGATIVE ng/mL (ref <2.5)
NORHYDROCODONE: NEGATIVE ng/mL (ref <2.5)
Nicotine Metabolite: NEGATIVE ng/mL (ref <5.0)
Noroxycodone: 3.7 ng/mL — ABNORMAL HIGH (ref <2.5)
Opiates: POSITIVE ng/mL — AB (ref <2.5)
Oxycodone: 20.9 ng/mL — ABNORMAL HIGH (ref <2.5)
Oxymorphone: NEGATIVE ng/mL (ref <2.5)
Phencyclidine: NEGATIVE ng/mL (ref <10)
Tapentadol: NEGATIVE ng/mL (ref <5.0)
Tramadol: NEGATIVE ng/mL (ref <5.0)
Zolpidem: NEGATIVE ng/mL (ref <5.0)

## 2017-05-27 LAB — DRUG TOX ALC METAB W/CON, ORAL FLD: Alcohol Metabolite: NEGATIVE ng/mL (ref <25)

## 2017-06-01 ENCOUNTER — Telehealth: Payer: Self-pay | Admitting: *Deleted

## 2017-06-01 NOTE — Telephone Encounter (Signed)
Oral swab drug screen was consistent for prescribed medications.  ?

## 2017-06-05 ENCOUNTER — Other Ambulatory Visit: Payer: Self-pay | Admitting: Family Medicine

## 2017-06-11 NOTE — Progress Notes (Signed)
   Subjective:   Patient ID: Christine Bradshaw    DOB: 27-Jan-1965, 52 y.o. female   MRN: [id number]  Christine Bradshaw is a 52 y.o. female with a history of HTN, diabetes, bipolar d/o here for   F/u lightheadedness, falls - Since last visit, had visits with her psychiatrist and chronic pain doctor where her Pamelor for neuropathic pain was discontinued in favor of Topamax 25 mg twice daily and her clonazepam dose was decreased to 0.5 mg from 1 mg 3 times daily.  Her oxycodone was continued at the current dose. - Patient has not had any falls since medication adjustment. Endorses decrease in lightheadedness and dizziness.   Diabetes, Type 2 - Last A1c 5.2 5/22 - Medications: Metformin 500mg  daily - Compliance: yes - Checking BG at home: yes, 70 - 147 for range - due for pneumococcal vaccine - Denies symptoms of hypoglycemia, polyuria, polydipsia, numbness extremities, foot ulcers/trauma  Hypertension: - Medications: Quinapril 5mg  - Compliance: good - Checking BP at home: yes - Denies any SOB, CP, vision changes, medication SEs - Endorses continued LE edema. Had to take 40mg  Lasix the other day for increased swelling  Review of Systems:  Per HPI.   [place], medications and smoking status reviewed.  Objective:   BP 112/64   Pulse 78   Temp 98.2 F (36.8 C) (Oral)   Ht 5\' 4"  (1.626 m)   Wt 207 lb (93.9 kg)   SpO2 99%   BMI 35.53 kg/m  Vitals and nursing note reviewed.  General: obese female, in no acute distress with non-toxic appearance HEENT: normocephalic, atraumatic, moist mucous membranes CV: regular rate and rhythm without murmurs, rubs, or gallops, trace lower extremity edema Lungs: clear to auscultation bilaterally with normal work of breathing Skin: warm, dry, no rashes or lesions Extremities: warm and well perfused, normal tone MSK: ROM grossly intact, strength intact, gait normal Neuro: Alert and oriented, speech normal  Assessment & Plan:   Essential  hypertension Continues to be low normal. Will discontinue quinapril given long-standing good diabetic control. Will reassess need to add it back in the future. Will add back prn lasix for swelling and weight gain.  Diabetes mellitus, type II, insulin dependent (HCC) Continues to have good control on new adjusted dose of metformin, no medication changes. Pneumococcal vaccine given today.  Light headedness Improved with medication adjustment by multiple providers. With improvement in symptoms and no further falls, do not feel the need for neurology referral or imaging at this time. Will continue to reassess for future symptoms.  Orders Placed This Encounter  Procedures  . Pneumococcal polysaccharide vaccine 23-valent greater than or equal to 2yo subcutaneous/IM   Meds ordered this encounter  Medications  . furosemide (LASIX) 20 MG tablet    Sig: Take 1 tablet (20 mg total) by mouth daily as needed. As needed for swelling or weight gain greater than 2 lbs.    Dispense:  30 tablet    Refill:  [place], DO PGY-1, [facility] 06/12/2017 11:15 AM

## 2017-06-12 ENCOUNTER — Other Ambulatory Visit: Payer: Self-pay

## 2017-06-12 ENCOUNTER — Ambulatory Visit: Payer: BLUE CROSS/BLUE SHIELD | Admitting: Family Medicine

## 2017-06-12 ENCOUNTER — Encounter: Payer: Self-pay | Admitting: Family Medicine

## 2017-06-12 VITALS — BP 112/64 | HR 78 | Temp 98.2°F | Ht 64.0 in | Wt 207.0 lb

## 2017-06-12 DIAGNOSIS — E119 Type 2 diabetes mellitus without complications: Secondary | ICD-10-CM | POA: Diagnosis not present

## 2017-06-12 DIAGNOSIS — I10 Essential (primary) hypertension: Secondary | ICD-10-CM

## 2017-06-12 DIAGNOSIS — Z23 Encounter for immunization: Secondary | ICD-10-CM | POA: Diagnosis not present

## 2017-06-12 DIAGNOSIS — R42 Dizziness and giddiness: Secondary | ICD-10-CM | POA: Diagnosis not present

## 2017-06-12 DIAGNOSIS — Z794 Long term (current) use of insulin: Secondary | ICD-10-CM

## 2017-06-12 MED ORDER — FUROSEMIDE 20 MG PO TABS: 20.0000 mg | ORAL_TABLET | Freq: Every day | ORAL | 1 refills | Status: AC | PRN

## 2017-06-12 NOTE — Patient Instructions (Signed)
It was great to see you!  I am so glad you are not falling anymore! I think optimizing your medication helped. We are taking off your quinapril and adding back your Lasix AS NEEDED for swelling or weight gain greater than 2 lbs. Consider taking the hydroxyzine at night instead of in the morning to prevent daytime sleepiness and falls.  Take care and seek immediate care sooner if you develop any concerns.   Dr. [facility]

## 2017-06-12 NOTE — Assessment & Plan Note (Addendum)
Improved with medication adjustment by multiple providers. With improvement in symptoms and no further falls, do not feel the need for neurology referral or imaging at this time. Will continue to reassess for future symptoms.

## 2017-06-12 NOTE — Assessment & Plan Note (Signed)
Continues to be low normal. Will discontinue quinapril given long-standing good diabetic control. Will reassess need to add it back in the future. Will add back prn lasix for swelling and weight gain.

## 2017-06-12 NOTE — Assessment & Plan Note (Addendum)
Continues to have good control on new adjusted dose of metformin, no medication changes. Pneumococcal vaccine given today.

## 2017-06-27 ENCOUNTER — Encounter: Payer: Self-pay | Admitting: Registered Nurse

## 2017-06-27 ENCOUNTER — Encounter: Payer: BLUE CROSS/BLUE SHIELD | Attending: Physical Medicine & Rehabilitation | Admitting: Registered Nurse

## 2017-06-27 VITALS — BP 106/75 | HR 67 | Resp 14 | Ht 64.0 in | Wt 209.0 lb

## 2017-06-27 DIAGNOSIS — M7062 Trochanteric bursitis, left hip: Secondary | ICD-10-CM | POA: Diagnosis not present

## 2017-06-27 DIAGNOSIS — M5416 Radiculopathy, lumbar region: Secondary | ICD-10-CM | POA: Diagnosis not present

## 2017-06-27 DIAGNOSIS — M47816 Spondylosis without myelopathy or radiculopathy, lumbar region: Secondary | ICD-10-CM | POA: Insufficient documentation

## 2017-06-27 DIAGNOSIS — M961 Postlaminectomy syndrome, not elsewhere classified: Secondary | ICD-10-CM | POA: Diagnosis not present

## 2017-06-27 DIAGNOSIS — G894 Chronic pain syndrome: Secondary | ICD-10-CM

## 2017-06-27 DIAGNOSIS — M7061 Trochanteric bursitis, right hip: Secondary | ICD-10-CM

## 2017-06-27 DIAGNOSIS — M62838 Other muscle spasm: Secondary | ICD-10-CM

## 2017-06-27 DIAGNOSIS — Z79891 Long term (current) use of opiate analgesic: Secondary | ICD-10-CM

## 2017-06-27 DIAGNOSIS — Z5181 Encounter for therapeutic drug level monitoring: Secondary | ICD-10-CM

## 2017-06-27 MED ORDER — OXYCODONE HCL 10 MG PO TABS: 10.0000 mg | ORAL_TABLET | Freq: Three times a day (TID) | ORAL | 0 refills | Status: DC | PRN

## 2017-06-27 NOTE — Progress Notes (Signed)
Subjective:    Patient ID: Christine Bradshaw, female    DOB: [birth year], 52 y.o.   MRN: [id number]  HPI: [name] is a 52 year old female who returns for follow up appointment for chronic pain and medication refill. She states her pain is located in her lower back radiating into her left lower extremity and bilateral hips L>R. She rates her pain 5. Her current exercise regime is walking.   [name] Morphine Equivalent is 45.00 MME. She is also prescribed Clonazepam by Beryle Lathe.We have discussed the black box warning of using opioids and benzodiazepines. I highlighted the dangers of using these drugs together and discussed the adverse events including respiratory suppression, overdose, cognitive impairment and importance of compliance with current regimen. We will continue to monitor and adjust as indicated.  She is being closely monitored and under the care of her psychiatrist Dr. Farris Has.   Last Oral Swab was Performed on 05/25/2017 it was consistent.    Pain Inventory Average Pain 5 Pain Right Now 5 My pain is sharp, burning and stabbing  In the last 24 hours, has pain interfered with the following? General activity 5 Relation with others 4 Enjoyment of life 5 What TIME of day is your pain at its worst? morning, daytime Sleep (in general) Good  Pain is worse with: walking, standing and some activites Pain improves with: rest, heat/ice and medication Relief from Meds: 7  Mobility use a walker how many minutes can you walk? 5 ability to climb steps?  yes do you drive?  no use a wheelchair transfers alone  Function disabled: date disabled . I need assistance with the following:  household duties and shopping Do you have any goals in this area?  yes  Neuro/Psych trouble walking spasms depression anxiety  Prior Studies Any changes since last visit?  no  Physicians involved in your care Any changes since last visit?  no   Family History   Problem Relation Age of Onset  . Hyperlipidemia Father   . Hypertension Father   . Heart disease Father   . Colon polyps Neg Hx   . Colon cancer Neg Hx    Social History   Socioeconomic History  . Marital status: Married    Spouse name: Not on file  . Number of children: 2  . Years of education: Not on file  . Highest education level: Not on file  Occupational History  . Occupation: disability  Social Needs  . Financial resource strain: Not on file  . Food insecurity:    Worry: Not on file    Inability: Not on file  . Transportation needs:    Medical: Not on file    Non-medical: Not on file  Tobacco Use  . Smoking status: Current Every Day Smoker    Packs/day: 1.00    Types: Cigarettes  . Smokeless tobacco: Never Used  Substance and Sexual Activity  . Alcohol use: No    Alcohol/week: 0.0 oz  . Drug use: No  . Sexual activity: Yes    Partners: Male  Lifestyle  . Physical activity:    Days per week: Not on file    Minutes per session: Not on file  . Stress: Not on file  Relationships  . Social connections:    Talks on phone: Not on file    Gets together: Not on file    Attends religious service: Not on file    Active member of club or organization: Not  on file    Attends meetings of clubs or organizations: Not on file    Relationship status: Not on file  Other Topics Concern  . Not on file  Social History Narrative   Husband on disability   Pt also on disability   Past Surgical History:  Procedure Laterality Date  . ABDOMINAL HYSTERECTOMY    . APPENDECTOMY    . CHOLECYSTECTOMY    . COLONOSCOPY W/ BIOPSIES  2010   adenomatous polyp repeat 2015  . COLONOSCOPY WITH PROPOFOL N/A 03/13/2013   Procedure: COLONOSCOPY WITH PROPOFOL;  Surgeon: Lafayette Dragon, MD;  Location: [facility];  Service: Endoscopy;  Laterality: N/A;  . ESOPHAGOGASTRODUODENOSCOPY  2010   showed erosions  . LUMBAR FUSION  2011   L4-L5-S1 Dr. Marcial Pacas  . TONSILLECTOMY    . UPPER  GASTROINTESTINAL ENDOSCOPY  2017   Past Medical History:  Diagnosis Date  . Anemia   . Arthritis   . Back pain, chronic   . Complication of anesthesia    manic episode  . Depression   . Diabetes mellitus type II   . GERD (gastroesophageal reflux disease)   . H/O hiatal hernia   . HEADACHE, CHRONIC 04/21/2008  . HX OF GALLSTONE 04/21/2008   Qualifier: Diagnosis of  By: Julaine Hua CMA ([place]), Estill Bamberg    . Hyperlipidemia   . Hypertension   . Insomnia    03-08-13"states is in control"  . Irritable bowel syndrome   . Memory loss of unknown cause   . Panic attack as reaction to stress   . Seizures ([place])    Passes out frequently, but not seizures per pt  . Syncope    Pt states happens once per week approximately  . Tremor    BP 106/75 (BP Location: Right Arm, Patient Position: Sitting, Cuff Size: Normal)   Pulse 67   Resp 14   Ht 5\' 4"  (1.626 m)   Wt 209 lb (94.8 kg)   SpO2 95%   BMI 35.87 kg/m   Opioid Risk Score:   Fall Risk Score:  `1  Depression screen PHQ 2/9  Depression screen [facility] 2/9 06/12/2017 05/24/2017 05/05/2017 04/07/2017 02/10/2017 01/27/2017 01/12/2017  Decreased Interest 0 0 0 1 2 0 0  Down, Depressed, Hopeless 0 0 0 1 2 0 0  PHQ - 2 Score 0 0 0 2 4 0 0  Altered sleeping - - - - - - -  Tired, decreased energy - - - - - - -  Change in appetite - - - - - - -  Feeling bad or failure about yourself  - - - - - - -  Trouble concentrating - - - - - - -  Moving slowly or fidgety/restless - - - - - - -  Suicidal thoughts - - - - - - -  PHQ-9 Score - - - - - - -  Difficult doing work/chores - - - - - - -  Some recent data might be hidden    Review of Systems  Constitutional: Negative.   HENT: Negative.   Eyes: Negative.   Respiratory: Negative.   Cardiovascular: Negative.   Gastrointestinal: Negative.   Endocrine: Negative.   Genitourinary: Negative.   Musculoskeletal: Positive for back pain, gait problem and myalgias.  Skin: Negative.   Allergic/Immunologic:  Negative.   Hematological: Negative.   Psychiatric/Behavioral: Positive for dysphoric mood. The patient is nervous/anxious.   All other systems reviewed and are negative.      Objective:  Physical Exam  Constitutional: She is oriented to person, place, and time. She appears well-developed and well-nourished.  HENT:  Head: Normocephalic and atraumatic.  Neck: Normal range of motion. Neck supple.  Cardiovascular: Normal rate and regular rhythm.  Pulmonary/Chest: Effort normal and breath sounds normal.  Musculoskeletal:  Normal Muscle Bulk and Muscle Testing Reveals: Upper Extremities: Full ROM and Muscle Strength 5/5 Lumbar Paraspinal Tenderness: L-3-L-5 Bilateral Greater Trochanter Tenderness L>R Lower Extremities: Full ROM and Muscle Strength 5/5 Arises from chair slowly using walker for support Antalgic gait  Neurological: She is alert and oriented to person, place, and time.  Skin: Skin is warm and dry.  Psychiatric: She has a normal mood and affect.  Nursing note and vitals reviewed.         Assessment & Plan:  1. Lumbar postlaminectomy syndrome- status post L4-5 and L5-S1 fusion with chronic postoperative pain as well as a chronic left L5 radiculopathy: Continue Topamax  06/27/2017. RefilledOxycodone10mg  one tablet every 8 hours #90. We will continue the opioid monitoring program, this consists of regular clinic visits, examinations, urine drug screen, pill counts as well as use of [place] Controlled Substance Reporting System. Continue with heat and exercise therapy. 2. Lumbar spondylosis above the level of the fusion: Continue HEP as tolerated. Continue to Monitor.06/27/2017 3.BilateralGreater Trochanteric Bursitis..Continue with heat, Ice therapy and HEP as tolerated. 06/27/2017 4. Muscle Spasms: Continuewith current treatment withRobaxin as needed. 06/27/2017  20 minutes of face to face patient care time was spent during this visit. All questions  were encouraged and answered.  F/U in 1 month.

## 2017-07-24 ENCOUNTER — Encounter: Payer: BLUE CROSS/BLUE SHIELD | Attending: Physical Medicine & Rehabilitation | Admitting: Registered Nurse

## 2017-07-24 ENCOUNTER — Other Ambulatory Visit: Payer: Self-pay

## 2017-07-24 ENCOUNTER — Encounter: Payer: Self-pay | Admitting: Registered Nurse

## 2017-07-24 VITALS — BP 103/73 | HR 69 | Ht 64.0 in | Wt 214.8 lb

## 2017-07-24 DIAGNOSIS — M5416 Radiculopathy, lumbar region: Secondary | ICD-10-CM

## 2017-07-24 DIAGNOSIS — Z79891 Long term (current) use of opiate analgesic: Secondary | ICD-10-CM

## 2017-07-24 DIAGNOSIS — M47816 Spondylosis without myelopathy or radiculopathy, lumbar region: Secondary | ICD-10-CM | POA: Diagnosis present

## 2017-07-24 DIAGNOSIS — M7062 Trochanteric bursitis, left hip: Secondary | ICD-10-CM | POA: Diagnosis not present

## 2017-07-24 DIAGNOSIS — M961 Postlaminectomy syndrome, not elsewhere classified: Secondary | ICD-10-CM | POA: Diagnosis not present

## 2017-07-24 DIAGNOSIS — K5909 Other constipation: Secondary | ICD-10-CM

## 2017-07-24 DIAGNOSIS — Z5181 Encounter for therapeutic drug level monitoring: Secondary | ICD-10-CM | POA: Diagnosis not present

## 2017-07-24 DIAGNOSIS — M62838 Other muscle spasm: Secondary | ICD-10-CM | POA: Diagnosis not present

## 2017-07-24 DIAGNOSIS — G894 Chronic pain syndrome: Secondary | ICD-10-CM | POA: Diagnosis not present

## 2017-07-24 MED ORDER — OXYCODONE HCL 10 MG PO TABS: 10.0000 mg | ORAL_TABLET | Freq: Three times a day (TID) | ORAL | 0 refills | Status: AC | PRN

## 2017-07-24 NOTE — Progress Notes (Signed)
Subjective:    Patient ID: Christine Bradshaw, female    DOB: [birth year], 52 y.o.   MRN: [id number]  HPI: [name] is a 52 year old female who returns for follow up appointment for chronic pain and medication refill. She states her pain is located in her lower back radiating into her left hip and left lower extremity. Also reports the  Topamax has helped her radicular pain. She rates her pain 3. Her current exercise regime is walking and walking in treadmill for 7 minutes two days a week.   [name] states she hasn't had a bowel movement in a week, she had followed up with her PCP she was prescribed Linzess. She reports it worked for a few days then she wasn't able to move her bowels. In the past she was prescribed Movantik and it was ineffective. States she's taking a stool softner and has used in enema in the past. At this time she refuses ED evaluation, she states she will f/u with her PCP. She was encouraged to follow up with her PCP and she verbalizes understanding.    [name] Morphine Equivalent is 45.00 MME. She is also prescribed clonazepam by Beryle Lathe. We have discussed the black box warning of using opioids and benzodiazepines. I highlighted the dangers of using these drugs together and discussed the adverse events including respiratory suppression, overdose, cognitive impairment and importance of compliance with current regimen. We will continue to monitor and adjust as indicated. She is being closely monitored and under the care of her of her psychiatrist Dr. Farris Has.    Last Oral Swab was Performed on 05/25/2017, it was consistent.    Pain Inventory Average Pain 3 Pain Right Now 3 My pain is sharp, stabbing and aching  In the last 24 hours, has pain interfered with the following? General activity 4 Relation with others 5 Enjoyment of life 5 What TIME of day is your pain at its worst? daytime Sleep (in general) Good  Pain is worse with:  walking, standing and some activites Pain improves with: rest, heat/ice and medication Relief from Meds: 3  Mobility how many minutes can you walk? 7 ability to climb steps?  yes do you drive?  no use a wheelchair transfers alone  Function I need assistance with the following:  household duties and shopping  Neuro/Psych spasms depression anxiety  Prior Studies Any changes since last visit?  no  Physicians involved in your care Any changes since last visit?  yes Primary care Christine Bradshaw   Family History  Problem Relation Age of Onset  . Hyperlipidemia Father   . Hypertension Father   . Heart disease Father   . Colon polyps Neg Hx   . Colon cancer Neg Hx    Social History   Socioeconomic History  . Marital status: Married    Spouse name: Not on file  . Number of children: 2  . Years of education: Not on file  . Highest education level: Not on file  Occupational History  . Occupation: disability  Social Needs  . Financial resource strain: Not on file  . Food insecurity:    Worry: Not on file    Inability: Not on file  . Transportation needs:    Medical: Not on file    Non-medical: Not on file  Tobacco Use  . Smoking status: Current Every Day Smoker    Packs/day: 1.00    Types: Cigarettes  . Smokeless tobacco: Never Used  Substance and Sexual Activity  . Alcohol use: No    Alcohol/week: 0.0 oz  . Drug use: No  . Sexual activity: Yes    Partners: Male  Lifestyle  . Physical activity:    Days per week: Not on file    Minutes per session: Not on file  . Stress: Not on file  Relationships  . Social connections:    Talks on phone: Not on file    Gets together: Not on file    Attends religious service: Not on file    Active member of club or organization: Not on file    Attends meetings of clubs or organizations: Not on file    Relationship status: Not on file  Other Topics Concern  . Not on file  Social History Narrative   Husband on disability     Pt also on disability   Past Surgical History:  Procedure Laterality Date  . ABDOMINAL HYSTERECTOMY    . APPENDECTOMY    . CHOLECYSTECTOMY    . COLONOSCOPY W/ BIOPSIES  2010   adenomatous polyp repeat 2015  . COLONOSCOPY WITH PROPOFOL N/A 03/13/2013   Procedure: COLONOSCOPY WITH PROPOFOL;  Surgeon: Lafayette Dragon, MD;  Location: [facility];  Service: Endoscopy;  Laterality: N/A;  . ESOPHAGOGASTRODUODENOSCOPY  2010   showed erosions  . LUMBAR FUSION  2011   L4-L5-S1 Dr. Marcial Pacas  . TONSILLECTOMY    . UPPER GASTROINTESTINAL ENDOSCOPY  2017   Past Medical History:  Diagnosis Date  . Anemia   . Arthritis   . Back pain, chronic   . Complication of anesthesia    manic episode  . Depression   . Diabetes mellitus type II   . GERD (gastroesophageal reflux disease)   . H/O hiatal hernia   . HEADACHE, CHRONIC 04/21/2008  . HX OF GALLSTONE 04/21/2008   Qualifier: Diagnosis of  By: Julaine Hua CMA ([place]), Estill Bamberg    . Hyperlipidemia   . Hypertension   . Insomnia    03-08-13"states is in control"  . Irritable bowel syndrome   . Memory loss of unknown cause   . Panic attack as reaction to stress   . Seizures ([place])    Passes out frequently, but not seizures per pt  . Syncope    Pt states happens once per week approximately  . Tremor    BP 103/73   Pulse 69   Ht 5\' 4"  (1.626 m)   Wt 214 lb 12.8 oz (97.4 kg)   SpO2 93%   BMI 36.87 kg/m   Opioid Risk Score:   Fall Risk Score:  `1  Depression screen PHQ 2/9  Depression screen [facility] 2/9 07/24/2017 06/12/2017 05/24/2017 05/05/2017 04/07/2017 02/10/2017 01/27/2017  Decreased Interest 1 0 0 0 1 2 0  Down, Depressed, Hopeless 1 0 0 0 1 2 0  PHQ - 2 Score 2 0 0 0 2 4 0  Altered sleeping - - - - - - -  Tired, decreased energy - - - - - - -  Change in appetite - - - - - - -  Feeling bad or failure about yourself  - - - - - - -  Trouble concentrating - - - - - - -  Moving slowly or fidgety/restless - - - - - - -  Suicidal thoughts - - - - - - -   PHQ-9 Score - - - - - - -  Difficult doing work/chores - - - - - - -  Some  recent data might be hidden    Review of Systems  Constitutional: Negative.   HENT: Negative.   Eyes: Negative.   Respiratory: Negative.   Cardiovascular:       Limb swelling  Gastrointestinal: Positive for constipation.  Endocrine: Negative.   Genitourinary: Negative.   Musculoskeletal: Negative.   Skin: Negative.   Allergic/Immunologic: Negative.   Neurological: Negative.   Hematological: Negative.   Psychiatric/Behavioral: Negative.   All other systems reviewed and are negative.      Objective:   Physical Exam  Constitutional: She is oriented to person, place, and time. She appears well-developed and well-nourished.  HENT:  Head: Normocephalic and atraumatic.  Neck: Normal range of motion. Neck supple.  Cardiovascular: Normal rate and regular rhythm.  Pulmonary/Chest: Effort normal and breath sounds normal.  Musculoskeletal:  Normal Muscle Bulk and Muscle Testing Reveals: Upper Extremities: Full ROM and Muscle Strength 5/5 Lumbar Paraspinal Tenderness: L-3-L-5 Lower Extremities: Full ROM and Muscle Strength 5/5 Arises from chair with Ease Narrow based gait   Neurological: She is alert and oriented to person, place, and time.  Skin: Skin is warm and dry.  Psychiatric: She has a normal mood and affect. Her behavior is normal.  Nursing note and vitals reviewed.         Assessment & Plan:  1. Lumbar postlaminectomy syndrome- status post L4-5 and L5-S1 fusion with chronic postoperative pain as well as a chronic left L5 radiculopathy:Continue Topamax  07/24/2017. RefilledOxycodone10mg  one tablet every 8 hours #90. We will continue the opioid monitoring program, this consists of regular clinic visits, examinations, urine drug screen, pill counts as well as use of [place] Controlled Substance Reporting System. Continue with heat and exercise therapy. 2. Lumbar spondylosis above  the level of the fusion: Continue HEP as tolerated. Continue to Monitor.07/24/2017 3.LeftGreater Trochanteric Bursitis..Continue with heat, Ice therapy and HEP as tolerated. 07/24/2017 4. Muscle Spasms: Continuewith current treatment withRobaxin as needed. 07/24/2017 5. Constipation: Refuses ED evaluation. She states she will F/U with her PCP.   20 minutes of face to face patient care time was spent during this visit. All questions were encouraged and answered.  F/U in 1 month.

## 2017-07-28 ENCOUNTER — Other Ambulatory Visit: Payer: Self-pay | Admitting: Family Medicine

## 2017-08-28 ENCOUNTER — Encounter: Payer: BLUE CROSS/BLUE SHIELD | Attending: Physical Medicine & Rehabilitation | Admitting: Registered Nurse

## 2017-08-28 ENCOUNTER — Encounter: Payer: Self-pay | Admitting: Registered Nurse

## 2017-08-28 VITALS — BP 111/81 | HR 89 | Resp 14 | Ht 64.0 in | Wt 209.0 lb

## 2017-08-28 DIAGNOSIS — M7062 Trochanteric bursitis, left hip: Secondary | ICD-10-CM

## 2017-08-28 DIAGNOSIS — M62838 Other muscle spasm: Secondary | ICD-10-CM

## 2017-08-28 DIAGNOSIS — M5416 Radiculopathy, lumbar region: Secondary | ICD-10-CM | POA: Diagnosis not present

## 2017-08-28 DIAGNOSIS — M961 Postlaminectomy syndrome, not elsewhere classified: Secondary | ICD-10-CM

## 2017-08-28 DIAGNOSIS — M47816 Spondylosis without myelopathy or radiculopathy, lumbar region: Secondary | ICD-10-CM | POA: Diagnosis not present

## 2017-08-28 DIAGNOSIS — G894 Chronic pain syndrome: Secondary | ICD-10-CM | POA: Diagnosis not present

## 2017-08-28 DIAGNOSIS — Z5181 Encounter for therapeutic drug level monitoring: Secondary | ICD-10-CM

## 2017-08-28 DIAGNOSIS — Z79891 Long term (current) use of opiate analgesic: Secondary | ICD-10-CM

## 2017-08-28 MED ORDER — METHOCARBAMOL 500 MG PO TABS: 500.0000 mg | ORAL_TABLET | Freq: Three times a day (TID) | ORAL | 3 refills | Status: DC | PRN

## 2017-08-28 NOTE — Patient Instructions (Addendum)
Increase Topamax to two tablets in the Morning and one tablet at bedtime.   Call Office on Thursday Morning to evaluate.   Call with any questions or concerns.    [phone]   Call office a week before medication runs out, if you start using your t pain medication three times a day as needed per prescription.

## 2017-08-28 NOTE — Progress Notes (Signed)
Subjective:    Patient ID: Christine Bradshaw, female    DOB: 03-29-1965, 52 y.o.   MRN: [id number]  HPI: Christine Bradshaw is a 52 year old female who returns for follow up appointment for chronic pain and medication refill. She states her pain is located in her lower back and left hip. Also states her radicular is better with Topamax, she states the Topamax wears off and returns in 4-6 hors, we will increase Topamax dose and she was instructed to call office on Thursday, she verbalizes understanding. Christine Bradshaw has only used 6 of her Oxycodone tablets in the last month since being prescribed the Oxycodone. No Oxycodone prescription given, we will continue to monitor, she verbalizes understanding.   Christine Bradshaw is 45.00 MME. She is also prescribed Clonazepam by Beryle Lathe .We have discussed the black box warning of using opioids and benzodiazepines. I highlighted the dangers of using these drugs together and discussed the adverse events including respiratory suppression, overdose, cognitive impairment and importance of compliance with current regimen. We will continue to monitor and adjust as indicated.  She is being closely monitored and under the care of her psychiatrist Dr. Farris Has    Pain Inventory Average Pain 3 Pain Right Now 4 My pain is burning and stabbing  In the last 24 hours, has pain interfered with the following? General activity 4 Relation with others 3 Enjoyment of life 5 What TIME of day is your pain at its worst? morning, daytime Sleep (in general) Good  Pain is worse with: walking, standing and some activites Pain improves with: rest, heat/ice and medication Relief from Meds: 7  Mobility walk without assistance walk with assistance use a walker how many minutes can you walk? 7 ability to climb steps?  yes do you drive?  no transfers alone Do you have any goals in this area?  yes  Function disabled: date disabled  .  Neuro/Psych spasms depression anxiety  Prior Studies Any changes since last visit?  no  Physicians involved in your care Any changes since last visit?  no   Family History  Problem Relation Age of Onset  . Hyperlipidemia Father   . Hypertension Father   . Heart disease Father   . Colon polyps Neg Hx   . Colon cancer Neg Hx    Social History   Socioeconomic History  . Marital status: Married    Spouse name: Not on file  . Number of children: 2  . Years of education: Not on file  . Highest education level: Not on file  Occupational History  . Occupation: disability  Social Needs  . Financial resource strain: Not on file  . Food insecurity:    Worry: Not on file    Inability: Not on file  . Transportation needs:    Medical: Not on file    Non-medical: Not on file  Tobacco Use  . Smoking status: Current Every Day Smoker    Packs/day: 1.00    Types: Cigarettes  . Smokeless tobacco: Never Used  Substance and Sexual Activity  . Alcohol use: No    Alcohol/week: 0.0 standard drinks  . Drug use: No  . Sexual activity: Yes    Partners: Male  Lifestyle  . Physical activity:    Days per week: Not on file    Minutes per session: Not on file  . Stress: Not on file  Relationships  . Social connections:    Talks on phone: Not  on file    Gets together: Not on file    Attends religious service: Not on file    Active member of club or organization: Not on file    Attends meetings of clubs or organizations: Not on file    Relationship status: Not on file  Other Topics Concern  . Not on file  Social History Narrative   Husband on disability   Pt also on disability   Past Surgical History:  Procedure Laterality Date  . ABDOMINAL HYSTERECTOMY    . APPENDECTOMY    . CHOLECYSTECTOMY    . COLONOSCOPY W/ BIOPSIES  2010   adenomatous polyp repeat 2015  . COLONOSCOPY WITH PROPOFOL N/A 03/13/2013   Procedure: COLONOSCOPY WITH PROPOFOL;  Surgeon: Lafayette Dragon, MD;   Location: [facility];  Service: Endoscopy;  Laterality: N/A;  . ESOPHAGOGASTRODUODENOSCOPY  2010   showed erosions  . LUMBAR FUSION  2011   L4-L5-S1 Dr. Marcial Pacas  . TONSILLECTOMY    . UPPER GASTROINTESTINAL ENDOSCOPY  2017   Past Medical History:  Diagnosis Date  . Anemia   . Arthritis   . Back pain, chronic   . Complication of anesthesia    manic episode  . Depression   . Diabetes mellitus type II   . GERD (gastroesophageal reflux disease)   . H/O hiatal hernia   . HEADACHE, CHRONIC 04/21/2008  . HX OF GALLSTONE 04/21/2008   Qualifier: Diagnosis of  By: Julaine Hua CMA ([place]), Estill Bamberg    . Hyperlipidemia   . Hypertension   . Insomnia    03-08-13"states is in control"  . Irritable bowel syndrome   . Memory loss of unknown cause   . Panic attack as reaction to stress   . Seizures ([place])    Passes out frequently, but not seizures per pt  . Syncope    Pt states happens once per week approximately  . Tremor    BP 111/81 (BP Location: Left Arm, Patient Position: Sitting, Cuff Size: Normal)   Pulse 89   Resp 14   Ht 5\' 4"  (1.626 m)   Wt 209 lb (94.8 kg)   SpO2 98%   BMI 35.87 kg/m   Opioid Risk Score:   Fall Risk Score:  `1  Depression screen PHQ 2/9  Depression screen [facility] 2/9 07/24/2017 06/12/2017 05/24/2017 05/05/2017 04/07/2017 02/10/2017 01/27/2017  Decreased Interest 1 0 0 0 1 2 0  Down, Depressed, Hopeless 1 0 0 0 1 2 0  PHQ - 2 Score 2 0 0 0 2 4 0  Altered sleeping - - - - - - -  Tired, decreased energy - - - - - - -  Change in appetite - - - - - - -  Feeling bad or failure about yourself  - - - - - - -  Trouble concentrating - - - - - - -  Moving slowly or fidgety/restless - - - - - - -  Suicidal thoughts - - - - - - -  PHQ-9 Score - - - - - - -  Difficult doing work/chores - - - - - - -  Some recent data might be hidden    Review of Systems  Constitutional: Negative.   HENT: Negative.   Eyes: Negative.   Respiratory: Negative.   Cardiovascular: Negative.     Gastrointestinal: Positive for constipation.  Endocrine: Negative.   Genitourinary: Negative.   Musculoskeletal: Positive for arthralgias and back pain.       Spasms  Skin: Negative.   Neurological: Negative.   Hematological: Negative.   Psychiatric/Behavioral: Positive for dysphoric mood. The patient is nervous/anxious.   All other systems reviewed and are negative.      Objective:   Physical Exam  Constitutional: She is oriented to person, place, and time. She appears well-developed and well-nourished.  HENT:  Head: Normocephalic and atraumatic.  Neck: Normal range of motion. Neck supple.  Cardiovascular: Normal rate and regular rhythm.  Pulmonary/Chest: Effort normal and breath sounds normal.  Musculoskeletal:  Normal Muscle Bulk and Muscle Testing Reveals: Upper Extremities: Full ROM and Muscle Strength 5/5 Lumbar Paraspinal Tenderness: L-4-L-5 Left Greater Trochanter Tenderness Lower Extremities: Full ROM and Muscle Strength 5/5 Arises from chair with ease   Neurological: She is alert and oriented to person, place, and time.  Skin: Skin is warm and dry.  Psychiatric: She has a normal mood and affect. Her behavior is normal.  Nursing note and vitals reviewed.         Assessment & Plan:  1. Lumbar postlaminectomy syndrome- status post L4-5 and L5-S1 fusion with chronic postoperative pain as well as a chronic left L5 radiculopathy:IncreaseTopamax50 mg in am and 25 mg at HS, instructed to call on Thursday on 08/31/2017. He verbalizes understanding. 08/28/2017. No prescription given: Oxycodone10mg  one tablet every 8 hours #90. We will continue the opioid monitoring program, this consists of regular clinic visits, examinations, urine drug screen, pill counts as well as use of [place] Controlled Substance Reporting System. Continue with heat and exercise therapy. 2. Lumbar spondylosis above the level of the fusion: Continue HEP as tolerated. Continue to  Monitor.08/28/2017 3.LeftGreater Trochanteric Bursitis..Continue with heat, Ice therapy and HEP as tolerated. 08/28/2017 4. Muscle Spasms: Continuewith current treatment withRobaxin as needed. 08/28/2017   20 minutes of face to face patient care time was spent during this visit. All questions were encouraged and answered.  F/U in 1 month.

## 2017-08-30 ENCOUNTER — Other Ambulatory Visit: Payer: Self-pay

## 2017-08-30 ENCOUNTER — Other Ambulatory Visit: Payer: Self-pay | Admitting: Family Medicine

## 2017-08-30 NOTE — Telephone Encounter (Signed)
Pt called stating that she is tolerating the Topiramate 50 mg and she is about out of the 25 mg because she has been taking two of them. Need 50 mg Topiramate send in for her.

## 2017-08-31 MED ORDER — TOPIRAMATE 50 MG PO TABS: 50.0000 mg | ORAL_TABLET | Freq: Two times a day (BID) | ORAL | 2 refills | Status: DC

## 2017-08-31 NOTE — Telephone Encounter (Signed)
Ms. Mcelmurry tolerating the Topamax 50 mg, will prescribe 50 mg BID, she verbalizes understanding.

## 2017-08-31 NOTE — Telephone Encounter (Signed)
Placed a call to Christine Bradshaw, no answer, left message to clarify her dosage. Awaiting a return call.

## 2017-09-01 ENCOUNTER — Other Ambulatory Visit: Payer: Self-pay | Admitting: Family Medicine

## 2017-09-06 ENCOUNTER — Encounter: Payer: Self-pay | Admitting: Podiatry

## 2017-09-06 ENCOUNTER — Ambulatory Visit: Payer: BLUE CROSS/BLUE SHIELD | Admitting: Podiatry

## 2017-09-06 VITALS — BP 118/79 | HR 87 | Resp 16

## 2017-09-06 DIAGNOSIS — B351 Tinea unguium: Secondary | ICD-10-CM

## 2017-09-06 DIAGNOSIS — L309 Dermatitis, unspecified: Secondary | ICD-10-CM

## 2017-09-06 LAB — HEPATIC FUNCTION PANEL
AG Ratio: 1.8 (calc) (ref 1.0–2.5)
ALBUMIN MSPROF: 4.6 g/dL (ref 3.6–5.1)
ALT: 14 U/L (ref 6–29)
AST: 19 U/L (ref 10–35)
Alkaline phosphatase (APISO): 87 U/L (ref 33–130)
BILIRUBIN DIRECT: 0.1 mg/dL (ref 0.0–0.2)
GLOBULIN: 2.5 g/dL (ref 1.9–3.7)
Indirect Bilirubin: 0.2 mg/dL (calc) (ref 0.2–1.2)
TOTAL PROTEIN: 7.1 g/dL (ref 6.1–8.1)
Total Bilirubin: 0.3 mg/dL (ref 0.2–1.2)

## 2017-09-06 MED ORDER — TRIAMCINOLONE ACETONIDE 0.1 % EX CREA: 1.0000 "application " | TOPICAL_CREAM | Freq: Two times a day (BID) | CUTANEOUS | 1 refills | Status: AC

## 2017-09-06 MED ORDER — TERBINAFINE HCL 250 MG PO TABS: 250.0000 mg | ORAL_TABLET | Freq: Every day | ORAL | 0 refills | Status: AC

## 2017-09-06 MED ORDER — TRIAMCINOLONE ACETONIDE 0.1 % EX CREA: TOPICAL_CREAM | Freq: Two times a day (BID) | CUTANEOUS | Status: DC

## 2017-09-06 NOTE — Progress Notes (Signed)
   Subjective:    Patient ID: Christine Bradshaw, female    DOB: Nov 06, 1965, 52 y.o.   MRN: [id number]  HPI    Review of Systems  All other systems reviewed and are negative.      Objective:   Physical Exam        Assessment & Plan:

## 2017-09-06 NOTE — Progress Notes (Signed)
Subjective:   Patient ID: Christine Bradshaw, female   DOB: 52 y.o.   MRN: [id number]   HPI Patient presents with a 3-year history of occasional blistering of the bottom of the left arch that does not create redness but is mildly tender and it is giving her more trouble this time goes on.  Patient is concerned because she does have diabetes she smokes on an occasional basis and likes to be active   Review of Systems  All other systems reviewed and are negative.       Objective:  Physical Exam  Constitutional: She appears well-developed and well-nourished.  Cardiovascular: Intact distal pulses.  Pulmonary/Chest: Effort normal.  Musculoskeletal: Normal range of motion.  Neurological: She is alert.  Skin: Skin is warm.  Nursing note and vitals reviewed.   Neurovascular status was found to be intact muscle strength was adequate range of motion within normal limits with patient found to have a pattern plantar aspect left arch measuring about 5 cm x 5 cm with approximately 10 small blisters that are localized with no drainage no odor no proximal edema erythema or other pathology noted.  Patient has good digital perfusion well oriented x3     Assessment:  Strong possibility that we are dealing with a fungal type infection which may be relatively systemic versus eczema or other chronic skin condition     Plan:  H&P reviewed and diabetic reviewed with patient today.  I went ahead and will start her on oral antifungal therapy consisting of Lamisil 200 mg daily and I did order blood work to check liver function and patient will be seen back in 2 months and may require other treatments depending on response to this medication

## 2017-09-11 ENCOUNTER — Encounter: Payer: Self-pay | Admitting: Neurology

## 2017-09-11 ENCOUNTER — Ambulatory Visit: Payer: BLUE CROSS/BLUE SHIELD | Admitting: Neurology

## 2017-09-11 VITALS — BP 112/80 | HR 83 | Ht 64.0 in | Wt 213.0 lb

## 2017-09-11 DIAGNOSIS — R0683 Snoring: Secondary | ICD-10-CM | POA: Diagnosis not present

## 2017-09-11 DIAGNOSIS — G2581 Restless legs syndrome: Secondary | ICD-10-CM | POA: Insufficient documentation

## 2017-09-11 DIAGNOSIS — G4730 Sleep apnea, unspecified: Secondary | ICD-10-CM

## 2017-09-11 DIAGNOSIS — R5382 Chronic fatigue, unspecified: Secondary | ICD-10-CM

## 2017-09-11 DIAGNOSIS — Z720 Tobacco use: Secondary | ICD-10-CM

## 2017-09-11 DIAGNOSIS — F5104 Psychophysiologic insomnia: Secondary | ICD-10-CM | POA: Diagnosis not present

## 2017-09-11 DIAGNOSIS — G4710 Hypersomnia, unspecified: Secondary | ICD-10-CM

## 2017-09-11 DIAGNOSIS — Z72821 Inadequate sleep hygiene: Secondary | ICD-10-CM | POA: Diagnosis not present

## 2017-09-11 DIAGNOSIS — F32A Depression, unspecified: Secondary | ICD-10-CM

## 2017-09-11 DIAGNOSIS — E118 Type 2 diabetes mellitus with unspecified complications: Secondary | ICD-10-CM

## 2017-09-11 DIAGNOSIS — F329 Major depressive disorder, single episode, unspecified: Secondary | ICD-10-CM

## 2017-09-11 DIAGNOSIS — F419 Anxiety disorder, unspecified: Secondary | ICD-10-CM

## 2017-09-11 NOTE — Patient Instructions (Signed)

## 2017-09-11 NOTE — Progress Notes (Signed)
SLEEP MEDICINE CLINIC   Provider:  Larey Seat, M.D.   Primary Care Physician:  Janie Morning, DO   Referring Provider: Debbora Dus, NP    Chief Complaint  Patient presents with  . New Patient (Initial Visit)    pt alone, rm 11. pt states that her psychiatry office recommended a sleep evaluation , she admits that she snores.  pt sleeps in recliner for 12 year , and ever since back surgery, multi -fusion in 2011.   Her mother has told her that she gasps for air and snores loudly duirng sleep and stops breathing. pt takes trazadone for sleep, had no previous sleep study.    HPI:  Christine Bradshaw is a 52 y.o. female, seen here in a referral from NP Debbora Dus ( formerly with Sheralyn Boatman, MD)  I have the pleasure of meeting Christine Bradshaw today, a 52 year old Caucasian female patient of [facility], presenting today for evaluation of a sleep disorder.  Her nurse practitioner states that the patient has sometimes trouble initiating sleep and has been taking trazodone, but that she also has been witnessed to snore loudly, as well as having apneas, per observation of her mother.  The patient has chronic back pain for over 12 years which has caused her to sleep better in a seated or reclined position, and for the last 8 years she has slept almost each night in a recliner.  The patient has been treated for Anxiety, bipolar depression with both manifestations of mania and depression, also panic attacks.  Her current medications include Atarax Risperdal, Subvenite, Klonopin, she has reported that she is increasingly fatigued and also has lost interest in some activities that she used to see as pleasurable.  Her referring provider wanted this to be also evaluated in today's consultation as well as in the sleep study.  She was told to eat decrease caffeine intake, to continue pain management at [facility], and to continue her psychiatric medication.  Family history is positive for  sleep apnea in the patient's mother,  the patient's daughter has also a mood disorder- Suspected to have schizoaffective disorder , age 47 and working as an Therapist, sports.  The patient received her diagnoses at age 54 .   Chief complaint according to patient :   Sleep habits are as follows:  Dinner time between 5 and 7 Pm, and afterwards watching TV - " too much pain for exercise" - she attempts to stretch, becomes more aware of her posture.  Her bedtime is at 9 PM but her recliner is in the living room and there she sleeps among her family members - who are watching Tv , lights are on, it's warm.... She needs only 15 minutes to go to sleep, and she is 'out'. No extra pillows. She sleeps through until 2.30 AM has a bathroom break and then goes back to sleep.  By that time her living room is cool, quiet and dark. She sleeps for another 4 hours, rises 6-7 AM - after sleeping through multiple alarms set for 5.30 AM (?). She feels still sleepy, but refreshed. She has a cup of strong coffee and is ready to go. She takes no naps in daytime.  She has rare nights without sleeps, not more than 2 a month.    Sleep medical history and family sleep history: Eldest  daughter diagnosed as ADHD at age 52 (?) , now dx with schizoaffective disorder, bipolar - personality disorder- worked in [place] and left her psychiatry  care, drinking ETOH, now moved back to [place].  Christine Bradshaw reports that she had a severe fall, hit her face, had facial injuries and a nasal bruise and epistaxis, and this was before some medication changes were done to alleviate her orthostatic hypotension.  She stated that she felt lightheaded at the time the medication changes were done 4 months ago and have helped.  She does have some symptoms of restless legs.   Social history: raised in a home with a lot of conflict- father would wake her up after late shift and parents would argue- making her the referree.  They finally divorced when she was 46, lived with  mother after that.  She drank alcohol in her late teens, but quit at age 52, caffeine - 4 cups of coffee, and tea twice a week, coke zero one bottle a day with supper. Smoker - 1 pp day- active.  The couple has 2 daughters, 75 RN at [employer]  and one 59- and has a psychiatric history, works as a Health and safety inspector.     Review of Systems: Out of a complete 14 system review, the patient complains of only the following symptoms, and all other reviewed systems are negative. Bipolar disorder. Snoring, back pain. Psychomotor slowing. Anxiety, panic attacks. PTSD- nightmares, not currently .  Epworth score  9/ 24  , Fatigue severity score 27/ 63   , depression score 6/ 15    Social History   Socioeconomic History  . Marital status: Married    Spouse name: Not on file  . Number of children: 2  . Years of education: Not on file  . Highest education level: Not on file  Occupational History  . Occupation: disability  Social Needs  . Financial resource strain: Not on file  . Food insecurity:    Worry: Not on file    Inability: Not on file  . Transportation needs:    Medical: Not on file    Non-medical: Not on file  Tobacco Use  . Smoking status: Current Every Day Smoker    Packs/day: 1.00    Types: Cigarettes  . Smokeless tobacco: Never Used  Substance and Sexual Activity  . Alcohol use: No    Alcohol/week: 0.0 standard drinks  . Drug use: No  . Sexual activity: Yes    Partners: Male  Lifestyle  . Physical activity:    Days per week: Not on file    Minutes per session: Not on file  . Stress: Not on file  Relationships  . Social connections:    Talks on phone: Not on file    Gets together: Not on file    Attends religious service: Not on file    Active member of club or organization: Not on file    Attends meetings of clubs or organizations: Not on file    Relationship status: Not on file  . Intimate partner violence:    Fear of current or ex partner: Not on file    Emotionally  abused: Not on file    Physically abused: Not on file    Forced sexual activity: Not on file  Other Topics Concern  . Not on file  Social History Narrative   Husband on disability   Pt also on disability    Family History  Problem Relation Age of Onset  . Hyperlipidemia Father   . Hypertension Father   . Heart disease Father   . Skin cancer Father   . Skin cancer Mother   .  Sleep apnea Mother   . Anxiety disorder Mother   . Depression Mother   . Skin cancer Sister   . Anxiety disorder Sister   . Depression Sister   . Breast cancer Maternal Grandmother   . Deep vein thrombosis Maternal Grandmother   . Colon polyps Neg Hx   . Colon cancer Neg Hx     Past Medical History:  Diagnosis Date  . Anemia   . Arthritis   . Back pain, chronic   . Complication of anesthesia    manic episode  . Depression   . Diabetes mellitus type II   . GERD (gastroesophageal reflux disease)   . H/O hiatal hernia   . HEADACHE, CHRONIC 04/21/2008  . HX OF GALLSTONE 04/21/2008   Qualifier: Diagnosis of  By: Julaine Hua CMA ([place]), Estill Bamberg    . Hyperlipidemia   . Hypertension   . Insomnia    03-08-13"states is in control"  . Irritable bowel syndrome   . Memory loss of unknown cause   . Panic attack as reaction to stress   . Seizures ([place])    Passes out frequently, but not seizures per pt  . Syncope    Pt states happens once per week approximately  . Tremor     Past Surgical History:  Procedure Laterality Date  . ABDOMINAL HYSTERECTOMY    . APPENDECTOMY    . CHOLECYSTECTOMY    . COLONOSCOPY W/ BIOPSIES  2010   adenomatous polyp repeat 2015  . COLONOSCOPY WITH PROPOFOL N/A 03/13/2013   Procedure: COLONOSCOPY WITH PROPOFOL;  Surgeon: Lafayette Dragon, MD;  Location: [facility];  Service: Endoscopy;  Laterality: N/A;  . ESOPHAGOGASTRODUODENOSCOPY  2010   showed erosions  . LUMBAR FUSION  2011   L4-L5-S1 Dr. Marcial Pacas  . TONSILLECTOMY    . UPPER GASTROINTESTINAL ENDOSCOPY  2017    Current  Outpatient Medications  Medication Sig Dispense Refill  . aspirin EC 81 MG tablet Take 81 mg by mouth every morning.    . cariprazine (VRAYLAR) capsule Take 3 mg by mouth daily.     . clonazePAM (KLONOPIN) 1 MG tablet Take 0.5 mg by mouth 3 (three) times daily.    Marland Kitchen docusate sodium (COLACE) 50 MG capsule Take 50 mg by mouth 2 (two) times daily.    . DULoxetine (CYMBALTA) 60 MG capsule Take 90 mg by mouth daily.     . furosemide (LASIX) 20 MG tablet Take 1 tablet (20 mg total) by mouth daily as needed. As needed for swelling or weight gain greater than 2 lbs. 30 tablet 1  . hydrOXYzine (ATARAX/VISTARIL) 10 MG tablet TK 1 T PO BID PRN FOR ANXIETY  0  . ipratropium (ATROVENT) 0.03 % nasal spray USE 2 SPRAYS IN EACH NOSTRIL EVERY 12 HOURS. 60 mL 0  . lamoTRIgine (LAMICTAL) 200 MG tablet TK 1 T PO BID  2  . Magnesium Citrate 125 MG CAPS Take 400 mg by mouth 2 (two) times daily as needed.    . methocarbamol (ROBAXIN) 500 MG tablet Take 1 tablet (500 mg total) by mouth 3 (three) times daily as needed for muscle spasms. 90 tablet 3  . omega-3 acid ethyl esters (LOVAZA) 1 g capsule TAKE 2 CAPSULES BY MOUTH TWICE DAILY. GENERIC EQUIVALENT FOR LOVAZA 360 capsule 0  . Oxycodone HCl 10 MG TABS Take 1 tablet (10 mg total) by mouth every 8 (eight) hours as needed. 90 tablet 0  . pravastatin (PRAVACHOL) 40 MG tablet Take 1 tablet (40 mg  total) by mouth every evening. 90 tablet 2  . quinapril (ACCUPRIL) 5 MG tablet Take 1 tablet (5 mg total) by mouth daily. 90 tablet 0  . risperidone (RISPERDAL) 4 MG tablet Take 8 mg by mouth daily.    . Sennosides (SENOKOT PO) Take by mouth.    . terbinafine (LAMISIL) 250 MG tablet Take 1 tablet (250 mg total) by mouth daily. 60 tablet 0  . topiramate (TOPAMAX) 50 MG tablet Take 1 tablet (50 mg total) by mouth 2 (two) times daily. 60 tablet 2  . traZODone (DESYREL) 100 MG tablet Take 100 mg by mouth at bedtime.      . triamcinolone cream (KENALOG) 0.1 % Apply 1 application  topically 2 (two) times daily. 30 g 1  . metFORMIN (GLUCOPHAGE) 500 MG tablet Take 1 tablet (500 mg total) by mouth daily with breakfast. 90 tablet 2  . omeprazole (PRILOSEC) 40 MG capsule Take 1 capsule (40 mg total) by mouth daily. 90 capsule 1   No current facility-administered medications for this visit.     Allergies as of 09/11/2017 - Review Complete 09/11/2017  Allergen Reaction Noted  . Abilify [aripiprazole]  11/19/2012  . Lithium  11/19/2012    Vitals: BP 112/80   Pulse 83   Ht '5\' 4"'$  (1.626 m)   Wt 213 lb (96.6 kg)   BMI 36.56 kg/m  Last Weight:  Wt Readings from Last 1 Encounters:  09/11/17 213 lb (96.6 kg)   QJJ:HERD mass index is 36.56 kg/m.     Last Height:   Ht Readings from Last 1 Encounters:  09/11/17 '5\' 4"'$  (1.626 m)    Physical exam:  General: The patient is awake, alert and appears not in acute distress. The patient is well groomed, she looks older than her numeric age/ . Head: Normocephalic, atraumatic. Neck is supple. Mallampati 3,  neck circumference:15.5 . Nasal airflow congested - rhinitis , allergic - grass. ,  Retrognathia is not seen.  Cardiovascular:  Regular rate and rhythm , without  murmurs or carotid bruit, and without distended neck veins. Respiratory: Lungs are clear to auscultation. Skin:  Without evidence of edema, or rash Trunk: BMI is 37. The patient's posture is stooped.   Neurologic exam : The patient is awake and alert, oriented to place and time.   Pupils are equal and briskly reactive to light.  Corrective lenses in place. Funduscopic exam without evidence of pallor or edema.  Extraocular movements  in vertical and horizontal planes intact and without nystagmus. Visual fields by finger perimetry are intact. Hearing to finger rub intact.   Facial sensation intact to fine touch.  Facial motor strength is symmetric and tongue and uvula move midline. Shoulder shrug was symmetrical.   Motor exam:   Normal tone, muscle bulk and  symmetric strength in all extremities. Reports morning stiffness, sometimes RLS/  Sensory:  Fine touch, pinprick and vibration were tested in all extremities. Proprioception tested in the upper extremities was normal. Coordination:Finger-to-nose maneuver  normal without evidence of ataxia, dysmetria or tremor. Gait and station: Patient walks without assistive device and is able unassisted to climb up to the exam table. Strength within normal limits. Stance is stable and normal- stooped posture.  Tandem gait deferred,  Turns with 4 Steps.  Deep tendon reflexes: in the  upper and lower extremities are symmetric and intact.   Assessment:  After physical and neurologic examination, review of laboratory studies,  Personal review of imaging studies, reports of other /same  Imaging  studies, results of polysomnography and / or neurophysiology testing and pre-existing records as far as provided in visit., my assessment is   1) I met our [place] does have significant risk factors for the presence of sleep apnea, given that she has been less exercise tolerance, that they have been weight changes placing her into the morbidly obese Category now, and also that she has resumed a slightly stooped posture.  Mallampati and neck circumference are additional risk factors.  Sitting in a recliner to sleep is in her case however not related to orthopnea but to back pain.  2) the patient was able to lose 132 pounds self-directed with diet.  Her current BMI is the result of her much reduced body weight also it is still over 35.  She has been known to snore, she has been known to have apnea as witnessed by her family, she has a positive family history of apnea.  Given that she requires to sleep in a recliner at this time I do not feel that we can invite her for a sleep study.  We do not longer provide comfortable chairs to sleep him.  We do not have access to medical beds either.  Home sleep test would be our only option to  screen for apnea.  She will be able to use this device in the comfort of her own home.  3) chronic back pain has been an issue for her for over 12 years, and the referring provider and psychiatry has made arrangements for pain management.  I think the pain should not be contributing to chronic insomnia and in her case she has reported that she sleeps usually through the night.  She does not have an additional need for a sleep medication, and her Epworth Sleepiness Scale is does not endorse hypersomnia.  4) active smoking- herein may lay the main problem, she is at higher risk of COPD and hypoxemia.   The patient was advised of the nature of the diagnosed disorder , the treatment options and the  risks for general health and wellness arising from not treating the condition.   I spent more than 45 minutes of face to face time with the patient.  Greater than 50% of time was spent in counseling and coordination of care. We have discussed the diagnosis and differential and I answered the patient's questions.    Plan:  Treatment plan and additional workup :  HST , will need to follow within 2-3 month later by NP . Attended sleep study not possible due to need to sleep sitting in a recliner.    Larey Seat, MD 03/10/5434, 06:77 PM  Certified in Neurology by ABPN Certified in [place] by [facility] [address], [place], [place] [address]

## 2017-09-12 ENCOUNTER — Other Ambulatory Visit: Payer: Self-pay | Admitting: Family Medicine

## 2017-09-13 ENCOUNTER — Other Ambulatory Visit: Payer: Self-pay | Admitting: Family Medicine

## 2017-09-27 ENCOUNTER — Ambulatory Visit (INDEPENDENT_AMBULATORY_CARE_PROVIDER_SITE_OTHER): Payer: BLUE CROSS/BLUE SHIELD | Admitting: Neurology

## 2017-09-27 DIAGNOSIS — F32A Depression, unspecified: Secondary | ICD-10-CM

## 2017-09-27 DIAGNOSIS — Z720 Tobacco use: Secondary | ICD-10-CM

## 2017-09-27 DIAGNOSIS — F5104 Psychophysiologic insomnia: Secondary | ICD-10-CM

## 2017-09-27 DIAGNOSIS — R5382 Chronic fatigue, unspecified: Secondary | ICD-10-CM

## 2017-09-27 DIAGNOSIS — F419 Anxiety disorder, unspecified: Secondary | ICD-10-CM

## 2017-09-27 DIAGNOSIS — G2581 Restless legs syndrome: Secondary | ICD-10-CM

## 2017-09-27 DIAGNOSIS — G4730 Sleep apnea, unspecified: Secondary | ICD-10-CM

## 2017-09-27 DIAGNOSIS — Z72821 Inadequate sleep hygiene: Secondary | ICD-10-CM

## 2017-09-27 DIAGNOSIS — G4710 Hypersomnia, unspecified: Secondary | ICD-10-CM

## 2017-09-27 DIAGNOSIS — F329 Major depressive disorder, single episode, unspecified: Secondary | ICD-10-CM

## 2017-09-27 DIAGNOSIS — R0683 Snoring: Secondary | ICD-10-CM

## 2017-09-28 ENCOUNTER — Encounter: Payer: BLUE CROSS/BLUE SHIELD | Attending: Physical Medicine & Rehabilitation | Admitting: Registered Nurse

## 2017-09-28 ENCOUNTER — Encounter: Payer: Self-pay | Admitting: Registered Nurse

## 2017-09-28 VITALS — BP 105/74 | HR 68 | Resp 14 | Ht 64.0 in | Wt 207.0 lb

## 2017-09-28 DIAGNOSIS — M961 Postlaminectomy syndrome, not elsewhere classified: Secondary | ICD-10-CM | POA: Diagnosis not present

## 2017-09-28 DIAGNOSIS — G894 Chronic pain syndrome: Secondary | ICD-10-CM

## 2017-09-28 DIAGNOSIS — M47816 Spondylosis without myelopathy or radiculopathy, lumbar region: Secondary | ICD-10-CM | POA: Insufficient documentation

## 2017-09-28 DIAGNOSIS — M7062 Trochanteric bursitis, left hip: Secondary | ICD-10-CM | POA: Diagnosis not present

## 2017-09-28 DIAGNOSIS — Z5181 Encounter for therapeutic drug level monitoring: Secondary | ICD-10-CM

## 2017-09-28 DIAGNOSIS — M62838 Other muscle spasm: Secondary | ICD-10-CM

## 2017-09-28 DIAGNOSIS — Z79891 Long term (current) use of opiate analgesic: Secondary | ICD-10-CM

## 2017-09-28 DIAGNOSIS — M5416 Radiculopathy, lumbar region: Secondary | ICD-10-CM | POA: Diagnosis not present

## 2017-09-28 NOTE — Progress Notes (Signed)
Subjective:    Patient ID: Christine Bradshaw, female    DOB: 27-Nov-1965, 52 y.o.   MRN: [id number]  HPI: [name] is a 52 year old female who returns for follow up appointment for chronic pain and medication refill. She states her pain is located in her lower back mainly left side radiating into her left hip and left lower extremity to her knee she reports. She rates her pain 3. Her current exercise regime is walking.   Ms. Baltz Morphine Equivalent is 45.00 MME, she's using the oxycodone sparingly last prescription was filled on 08/02/2017. She is also prescribed Clonazepam by Beryle Lathe NP. We have discussed the black box warning of using opioids and benzodiazepines. I highlighted the dangers of using these drugs together and discussed the adverse events including respiratory suppression, overdose, cognitive impairment and importance of compliance with current regimen. We will continue to monitor and adjust as indicated. . She is being closely monitored and under the care of her psychiatrist Dr. Caprice Beaver.  Last Oral Swab was Performed on 05/25/2017, it was consistent.   .  Pain Inventory Average Pain 3 Pain Right Now 3 My pain is sharp and burning  In the last 24 hours, has pain interfered with the following? General activity 4 Relation with others 4 Enjoyment of life 5 What TIME of day is your pain at its worst? daytime Sleep (in general) Good  Pain is worse with: walking and standing Pain improves with: heat/ice and medication Relief from Meds: 7  Mobility walk without assistance walk with assistance use a walker how many minutes can you walk? 5 ability to climb steps?  yes do you drive?  no transfers alone Do you have any goals in this area?  yes  Function I need assistance with the following:  meal prep, household duties and shopping Do you have any goals in this area?  yes  Neuro/Psych depression anxiety  Prior Studies Any changes  since last visit?  no  Physicians involved in your care Any changes since last visit?  no   Family History  Problem Relation Age of Onset  . Hyperlipidemia Father   . Hypertension Father   . Heart disease Father   . Skin cancer Father   . Skin cancer Mother   . Sleep apnea Mother   . Anxiety disorder Mother   . Depression Mother   . Skin cancer Sister   . Anxiety disorder Sister   . Depression Sister   . Breast cancer Maternal Grandmother   . Deep vein thrombosis Maternal Grandmother   . Colon polyps Neg Hx   . Colon cancer Neg Hx    Social History   Socioeconomic History  . Marital status: Married    Spouse name: Not on file  . Number of children: 2  . Years of education: Not on file  . Highest education level: Not on file  Occupational History  . Occupation: disability  Social Needs  . Financial resource strain: Not on file  . Food insecurity:    Worry: Not on file    Inability: Not on file  . Transportation needs:    Medical: Not on file    Non-medical: Not on file  Tobacco Use  . Smoking status: Current Every Day Smoker    Packs/day: 1.00    Types: Cigarettes  . Smokeless tobacco: Never Used  Substance and Sexual Activity  . Alcohol use: No    Alcohol/week: 0.0 standard drinks  .  Drug use: No  . Sexual activity: Yes    Partners: Male  Lifestyle  . Physical activity:    Days per week: Not on file    Minutes per session: Not on file  . Stress: Not on file  Relationships  . Social connections:    Talks on phone: Not on file    Gets together: Not on file    Attends religious service: Not on file    Active member of club or organization: Not on file    Attends meetings of clubs or organizations: Not on file    Relationship status: Not on file  Other Topics Concern  . Not on file  Social History Narrative   Husband on disability   Pt also on disability   Past Surgical History:  Procedure Laterality Date  . ABDOMINAL HYSTERECTOMY    .  APPENDECTOMY    . CHOLECYSTECTOMY    . COLONOSCOPY W/ BIOPSIES  2010   adenomatous polyp repeat 2015  . COLONOSCOPY WITH PROPOFOL N/A 03/13/2013   Procedure: COLONOSCOPY WITH PROPOFOL;  Surgeon: Lafayette Dragon, MD;  Location: [facility];  Service: Endoscopy;  Laterality: N/A;  . ESOPHAGOGASTRODUODENOSCOPY  2010   showed erosions  . LUMBAR FUSION  2011   L4-L5-S1 Dr. Marcial Pacas  . TONSILLECTOMY    . UPPER GASTROINTESTINAL ENDOSCOPY  2017   Past Medical History:  Diagnosis Date  . Anemia   . Arthritis   . Back pain, chronic   . Complication of anesthesia    manic episode  . Depression   . Diabetes mellitus type II   . GERD (gastroesophageal reflux disease)   . H/O hiatal hernia   . HEADACHE, CHRONIC 04/21/2008  . HX OF GALLSTONE 04/21/2008   Qualifier: Diagnosis of  By: Julaine Hua CMA ([place]), Estill Bamberg    . Hyperlipidemia   . Hypertension   . Insomnia    03-08-13"states is in control"  . Irritable bowel syndrome   . Memory loss of unknown cause   . Panic attack as reaction to stress   . Seizures ([place])    Passes out frequently, but not seizures per pt  . Syncope    Pt states happens once per week approximately  . Tremor    BP 105/74 (BP Location: Left Arm, Patient Position: Sitting, Cuff Size: Normal)   Pulse 68   Resp 14   Ht 5\' 4"  (1.626 m)   Wt 207 lb (93.9 kg)   SpO2 97%   BMI 35.53 kg/m   Opioid Risk Score:   Fall Risk Score:  `1  Depression screen PHQ 2/9  Depression screen [facility] 2/9 07/24/2017 06/12/2017 05/24/2017 05/05/2017 04/07/2017 02/10/2017 01/27/2017  Decreased Interest 1 0 0 0 1 2 0  Down, Depressed, Hopeless 1 0 0 0 1 2 0  PHQ - 2 Score 2 0 0 0 2 4 0  Altered sleeping - - - - - - -  Tired, decreased energy - - - - - - -  Change in appetite - - - - - - -  Feeling bad or failure about yourself  - - - - - - -  Trouble concentrating - - - - - - -  Moving slowly or fidgety/restless - - - - - - -  Suicidal thoughts - - - - - - -  PHQ-9 Score - - - - - - -  Difficult  doing work/chores - - - - - - -  Some recent data might be hidden   .  Review of Systems  Constitutional: Negative.   HENT: Negative.   Eyes: Negative.   Respiratory: Negative.   Cardiovascular: Negative.   Gastrointestinal: Negative.   Endocrine: Negative.   Genitourinary: Negative.   Musculoskeletal: Positive for back pain.  Allergic/Immunologic: Negative.   Neurological: Negative.   Hematological: Negative.   Psychiatric/Behavioral: Positive for dysphoric mood. The patient is nervous/anxious.   All other systems reviewed and are negative.      Objective:   Physical Exam  Constitutional: She is oriented to person, place, and time. She appears well-developed and well-nourished.  HENT:  Head: Normocephalic and atraumatic.  Neck: Normal range of motion. Neck supple.  Cardiovascular: Normal rate and regular rhythm.  Pulmonary/Chest: Effort normal and breath sounds normal.  Musculoskeletal:  Normal Muscle Bulk and Muscle Testing Reveals: Upper Extremities: Full ROM and Muscle Strength 5/5 Lumbar Paraspinal Tenderness: L-4-L-5 Mainly Left Side Lower Extremities:Full ROM and Muscle Strength 5/5 Left Greater Trochanter Tenderness Arises from Table with Ease Narrow Based Gait  Neurological: She is alert and oriented to person, place, and time.  Skin: Skin is warm and dry.  Psychiatric: She has a normal mood and affect. Her behavior is normal.  Nursing note and vitals reviewed.         Assessment & Plan:  1. Lumbar postlaminectomy syndrome- status post L4-5 and L5-S1 fusion with chronic postoperative pain as well as a chronic left L5 radiculopathy.Continue: Topamax and Pamelor. 09/28/2017 discontinued.RefilledOxycodone10mg  one tablet every 8 hours #90. We will continue the opioid monitoring program, this consists of regular clinic visits, examinations, urine drug screen, pill counts as well as use of [place] Controlled Substance Reporting System. Continue with  heat and exercise therapy. 2. Lumbar spondylosis above the level of the fusion: Continue HEP as tolerated. Continue to Monitor.09/28/2017 3.LeftGreater Trochanteric BursitisScheduled for Injection Continue with heat, Ice therapy and HEP as tolerated. 09/28/2017 4. Muscle Spasms: Continuewith current treatment withRobaxin as needed. 09/28/2017  20 minutes of face to face patient care time was spent during this visit. All questions were encouraged and answered.  F/U in 1 month.

## 2017-10-08 DIAGNOSIS — Z720 Tobacco use: Secondary | ICD-10-CM | POA: Insufficient documentation

## 2017-10-08 NOTE — Procedures (Signed)
NAME:   Christine Bradshaw                                                               DOB: Nov 11, 1965 MEDICAL RECORD NUMBER [id number]                                                      DOS: 09/27/2017  REFERRING PHYSICIAN: Debbora Dus, NP STUDY PERFORMED: Home Sleep Study on apnea link  HISTORY: Christine Bradshaw is a 52 y.o. female, seen here in a referral from NP Debbora Dus (formerly Associate of Sheralyn Boatman, MD).  I have the pleasure of meeting Christine Bradshaw on 09-11-2017, a 52 year old Caucasian female patient of [facility], presenting for evaluation of a sleep disorder, having trouble initiating sleep. She also has been witnessed to snore loudly, as well as having apneas, per observation of her mother.  The patient has chronic back pain for over 12 years and for the last 8 years she has slept almost each night in a recliner. She gasps for air. The patient has been treated for Anxiety, bipolar depression with both manifestations of mania and depression, as well as panic attacks. Her current medications include Atarax, Risperdal, and Klonopin. She has reported that she is increasingly fatigued and has lost interest in some activities that she used to see as pleasurable.  Her referring provider wanted this to be also evaluated in today's consultation as well as in the sleep study.  She was told to decrease caffeine intake, to continue pain management at [facility], and to continue her psychiatric medication. BMI: 36.5. The Epworth Sleepiness score endorsed at 9/24 points, FSS at 27/63, GDS at 6/15 points.  STUDY RESULTS:  Total Recording Time:  9 hours 5 minutes, valid 7 h and 11 min. Total Apnea/Hypopnea Index (AHI): 2.5 /h, RDI: 5.0/h, consistent of 5 obstructive and 2 central apneas. Average Oxygen Saturation:  89 %, Lowest Oxygen Desaturation: 85%  Total Time Oxygen Saturation below 89 %: 145 minutes, below 90% for 249 minutes.   Average Heart Rate:  63 bpm (between 46 and 95  bpm) IMPRESSION:  Moderate Snoring degree is indicated by RDI, but the AHI fails to indicate a clinically significant degree of Apnea. There is prolonged hypoxemia noted- may indicate COPD.   RECOMMENDATION: Consider pulmonary specialist referral for suspected COPD/ tobacco use disorder manifesting in hypoxemia. No significant degree of apnea found- No intervention by CPAP indicated. Insomnia or sleep efficiency cannot be evaluated in a HST.  I certify that I have reviewed the raw data recording prior to the issuance of this report in accordance with the standards of the American Academy of Sleep Medicine (AASM). Larey Seat, M.D.  10-08-2017    Medical Director of [facility] at [facility], accredited by the AASM. Diplomat of the ABPN and ABSM.

## 2017-10-09 ENCOUNTER — Telehealth: Payer: Self-pay | Admitting: Neurology

## 2017-10-09 NOTE — Telephone Encounter (Signed)
-----   Message from Larey Seat, MD sent at 10/08/2017 10:47 AM EDT ----- STUDY RESULTS: Valid Recording Time:  7  h and 11 min. Total Apnea/Hypopnea Index (AHI): 2.5 /h, RDI: 5.0/h, consistent  of 5 obstructive and 2 central apneas. Average Oxygen Saturation: 89 %, Lowest Oxygen Desaturation: 85%   Total Time in Oxygen Saturation below 89 %: 145 minutes, below 90%  for 249 minutes.  Average Heart Rate: 63 bpm (between 46 and 95 bpm) IMPRESSION: Moderate Snoring degree is indicated by the RDI, but the  AHI fails to indicate a clinically significant degree of Apnea.   There is prolonged hypoxemia noted- may indicate COPD.   RECOMMENDATION: Consider pulmonary specialist referral for  suspected COPD/ tobacco use disorder manifesting in hypoxemia.  No  significant degree of apnea found- No intervention by CPAP  indicated.  Insomnia or sleep efficiency cannot be evaluated in a  HST.  ccREFERRING PHYSICIAN: Debbora Dus, NP

## 2017-10-09 NOTE — Telephone Encounter (Signed)
Called the patient and reviewed the sleep study results. Informed the patient that the HST was indicative of moderate snoring but was not associated with sleep apnea. Informed her that her oxygen level was significantly lower for a long period of time. Patient has hypoxemia and is in need of considering a referral to pulmonology. Pt verbalized understanding. She requested I sent a copy to NP, Debbora Dus. I have forwarded this information to her and advised a pulmonology referral to assess treatment for hypoxemia. Pt verbalized understanding and was appreciative

## 2017-10-16 ENCOUNTER — Other Ambulatory Visit: Payer: Self-pay | Admitting: Family Medicine

## 2017-10-23 ENCOUNTER — Ambulatory Visit: Payer: BLUE CROSS/BLUE SHIELD | Admitting: Physical Medicine & Rehabilitation

## 2017-10-24 ENCOUNTER — Encounter: Payer: BLUE CROSS/BLUE SHIELD | Attending: Physical Medicine & Rehabilitation

## 2017-10-24 ENCOUNTER — Encounter: Payer: Self-pay | Admitting: Physical Medicine & Rehabilitation

## 2017-10-24 ENCOUNTER — Ambulatory Visit: Payer: BLUE CROSS/BLUE SHIELD | Admitting: Physical Medicine & Rehabilitation

## 2017-10-24 VITALS — BP 110/79 | HR 84 | Resp 14 | Ht 64.0 in | Wt 214.0 lb

## 2017-10-24 DIAGNOSIS — M47816 Spondylosis without myelopathy or radiculopathy, lumbar region: Secondary | ICD-10-CM | POA: Insufficient documentation

## 2017-10-24 DIAGNOSIS — M7062 Trochanteric bursitis, left hip: Secondary | ICD-10-CM

## 2017-10-24 MED ORDER — TOPIRAMATE 50 MG PO TABS: 50.0000 mg | ORAL_TABLET | Freq: Two times a day (BID) | ORAL | 0 refills | Status: DC

## 2017-10-24 NOTE — Progress Notes (Signed)
LEFT Trochanteric bursa injection With ultrasound guidance  Indication Trochanteric bursitis. Exam has tenderness over the greater trochanter of the hip. Pain has not responded to conservative care such as exercise therapy and oral medications. Pain interferes with sleep or with mobility Informed consent was obtained after describing risks and benefits of the procedure with the patient these include bleeding bruising and infection. Patient has signed written consent form. Patient placed in a lateral decubitus position with the affected LEFT hip superior. Point of maximal pain was palpated marked and prepped with Betadine and entered with a needle to bone contact. Needle slightly withdrawn then 6mg  of betamethasone with 4 cc 1% lidocaine were injected. Patient tolerated procedure well. Post procedure instructions given.

## 2017-10-24 NOTE — Patient Instructions (Signed)
Trochanteric Bursitis Trochanteric bursitis is a condition that causes hip pain. Trochanteric bursitis happens when fluid-filled sacs (bursae) in the hip get irritated. Normally these sacs absorb shock and help strong bands of tissue (tendons) in your hip glide smoothly over each other and over your hip bones. What are the causes? This condition results from increased friction between the hip bones and the tendons that go over them. This condition can happen if you:  Have weak hips.  Use your hip muscles too much (overuse).  Get hit in the hip.  What increases the risk? This condition is more likely to develop in:  Women.  Adults who are middle-aged or older.  People with arthritis or a spinal condition.  People with weak buttocks muscles (gluteal muscles).  People who have one leg that is shorter than the other.  People who participate in certain kinds of athletic activities, such as: ? Running sports, especially long-distance running. ? Contact sports, like football or martial arts. ? Sports in which falls may occur, like skiing.  What are the signs or symptoms? The main symptom of this condition is pain and tenderness over the point of your hip. The pain may be:  Sharp and intense.  Dull and achy.  Felt on the outside of your thigh.  It may increase when you:  Lie on your side.  Walk or run.  Go up on stairs.  Sit.  Stand up after sitting.  Stand for long periods of time.  How is this diagnosed? This condition may be diagnosed based on:  Your symptoms.  Your medical history.  A physical exam.  Imaging tests, such as: ? X-rays to check your bones. ? An MRI or ultrasound to check your tendons and muscles.  During your physical exam, your health care provider will check the movement and strength of your hip. He or she may press on the point of your hip to check for pain. How is this treated? This condition may be treated by:  Resting.  Reducing  your activity.  Avoiding activities that cause pain.  Using crutches, a cane, or a walker to decrease the strain on your hip.  Taking medicine to help with swelling.  Having medicine injected into the bursae to help with swelling.  Using ice, heat, and massage therapy for pain relief.  Physical therapy exercises for strength and flexibility.  Surgery (rare).  Follow these instructions at home: Activity  Rest.  Avoid activities that cause pain.  Return to your normal activities as told by your health care provider. Ask your health care provider what activities are safe for you. Managing pain, stiffness, and swelling  Take over-the-counter and prescription medicines only as told by your health care provider.  If directed, apply heat to the injured area as told by your health care provider. ? Place a towel between your skin and the heat source. ? Leave the heat on for 20-30 minutes. ? Remove the heat if your skin turns bright red. This is especially important if you are unable to feel pain, heat, or cold. You may have a greater risk of getting burned.  If directed, apply ice to the injured area: ? Put ice in a plastic bag. ? Place a towel between your skin and the bag. ? Leave the ice on for 20 minutes, 2-3 times a day. General instructions  If the affected leg is one that you use for driving, ask your health care provider when it is safe to drive.    Use crutches, a cane, or a walker as told by your health care provider.  If one of your legs is shorter than the other, get fitted for a shoe insert.  Lose weight if you are overweight. How is this prevented?  Wear supportive footwear that is appropriate for your sport.  If you have hip pain, start any new exercise or sport slowly.  Maintain physical fitness, including: ? Strength. ? Flexibility. Contact a health care provider if:  Your pain does not improve with 2-4 weeks. Get help right away if:  You develop  severe pain.  You have a fever.  You develop increased redness over your hip.  You have a change in your bowel function or bladder function.  You cannot control the muscles in your feet. This information is not intended to replace advice given to you by your health care provider. Make sure you discuss any questions you have with your health care provider. Document Released: 01/28/2004 Document Revised: 08/26/2015 Document Reviewed: 12/05/2014 Elsevier Interactive Patient Education  2018 Elsevier Inc.  

## 2017-11-09 ENCOUNTER — Other Ambulatory Visit: Payer: Self-pay | Admitting: Family Medicine

## 2017-11-09 DIAGNOSIS — Z1231 Encounter for screening mammogram for malignant neoplasm of breast: Secondary | ICD-10-CM

## 2017-11-25 ENCOUNTER — Other Ambulatory Visit: Payer: Self-pay | Admitting: Family Medicine

## 2017-12-03 ENCOUNTER — Other Ambulatory Visit: Payer: Self-pay | Admitting: Family Medicine

## 2017-12-06 ENCOUNTER — Encounter: Payer: BLUE CROSS/BLUE SHIELD | Attending: Physical Medicine & Rehabilitation | Admitting: Registered Nurse

## 2017-12-06 ENCOUNTER — Ambulatory Visit: Payer: BLUE CROSS/BLUE SHIELD | Admitting: Podiatry

## 2017-12-06 ENCOUNTER — Encounter: Payer: Self-pay | Admitting: Podiatry

## 2017-12-06 ENCOUNTER — Encounter: Payer: Self-pay | Admitting: Registered Nurse

## 2017-12-06 VITALS — BP 110/77 | HR 70 | Ht 64.0 in | Wt 220.0 lb

## 2017-12-06 DIAGNOSIS — M961 Postlaminectomy syndrome, not elsewhere classified: Secondary | ICD-10-CM | POA: Diagnosis not present

## 2017-12-06 DIAGNOSIS — M5416 Radiculopathy, lumbar region: Secondary | ICD-10-CM | POA: Diagnosis not present

## 2017-12-06 DIAGNOSIS — M47816 Spondylosis without myelopathy or radiculopathy, lumbar region: Secondary | ICD-10-CM | POA: Insufficient documentation

## 2017-12-06 DIAGNOSIS — M7062 Trochanteric bursitis, left hip: Secondary | ICD-10-CM | POA: Diagnosis not present

## 2017-12-06 DIAGNOSIS — Z5181 Encounter for therapeutic drug level monitoring: Secondary | ICD-10-CM

## 2017-12-06 DIAGNOSIS — M1712 Unilateral primary osteoarthritis, left knee: Secondary | ICD-10-CM

## 2017-12-06 DIAGNOSIS — L600 Ingrowing nail: Secondary | ICD-10-CM | POA: Diagnosis not present

## 2017-12-06 DIAGNOSIS — Z79891 Long term (current) use of opiate analgesic: Secondary | ICD-10-CM

## 2017-12-06 DIAGNOSIS — G894 Chronic pain syndrome: Secondary | ICD-10-CM

## 2017-12-06 DIAGNOSIS — M62838 Other muscle spasm: Secondary | ICD-10-CM

## 2017-12-06 MED ORDER — METHOCARBAMOL 500 MG PO TABS: 500.0000 mg | ORAL_TABLET | Freq: Three times a day (TID) | ORAL | 1 refills | Status: DC | PRN

## 2017-12-06 MED ORDER — TOPIRAMATE 50 MG PO TABS: 50.0000 mg | ORAL_TABLET | Freq: Two times a day (BID) | ORAL | 1 refills | Status: AC

## 2017-12-06 MED ORDER — NEOMYCIN-POLYMYXIN-HC 3.5-10000-1 OT SOLN: OTIC | 1 refills | Status: AC

## 2017-12-06 NOTE — Patient Instructions (Signed)

## 2017-12-06 NOTE — Progress Notes (Signed)
Subjective:    Patient ID: Christine Bradshaw, female    DOB: 24-Jun-1965, 52 y.o.   MRN: [id number]  HPI: Christine Bradshaw is a 52 y.o. female who returns for follow up appointment for chronic pain and medication refill. She states her  pain is located in her lower back radiating into her left buttock, left hip and left lower extremity.  Also reports left knee pain. She rates her pain 4. Her current exercise regime is walking.   Christine Bradshaw is in the process of changing her insurance due to financial hardship, she will call office once she establishes a new PCP.   Ms. Morphine equivalent is 0.00MME, Last time Oxycodone was filled was on 08/02/2017. She's also prescribed Clonazepam by Christine Bradshaw ANP.We have discussed the black box warning of using opioids and benzodiazepines. I highlighted the dangers of using these drugs together and discussed the adverse events including respiratory suppression, overdose, cognitive impairment and importance of compliance with current regimen. We will continue to monitor and adjust as indicated.  She is being closely monitored and under the care of her psychiatrist at [place].   Pain Inventory Average Pain 4 Pain Right Now 4 My pain is sharp, burning and stabbing  In the last 24 hours, has pain interfered with the following? General activity 5 Relation with others 4 Enjoyment of life 6 What TIME of day is your pain at its worst? morning Sleep (in general) Good  Pain is worse with: walking, bending, standing and some activites Pain improves with: rest, heat/ice, medication and injections Relief from Meds: 7  Mobility use a walker ability to climb steps?  yes do you drive?  no  Function I need assistance with the following:  household duties and shopping  Neuro/Psych bladder control problems spasms depression anxiety  Prior Studies Any changes since last visit?  no  Physicians involved in your  care Any changes since last visit?  no   Family History  Problem Relation Age of Onset  . Hyperlipidemia Father   . Hypertension Father   . Heart disease Father   . Skin cancer Father   . Skin cancer Mother   . Sleep apnea Mother   . Anxiety disorder Mother   . Depression Mother   . Skin cancer Sister   . Anxiety disorder Sister   . Depression Sister   . Breast cancer Maternal Grandmother   . Deep vein thrombosis Maternal Grandmother   . Colon polyps Neg Hx   . Colon cancer Neg Hx    Social History   Socioeconomic History  . Marital status: Married    Spouse name: Not on file  . Number of children: 2  . Years of education: Not on file  . Highest education level: Not on file  Occupational History  . Occupation: disability  Social Needs  . Financial resource strain: Not on file  . Food insecurity:    Worry: Not on file    Inability: Not on file  . Transportation needs:    Medical: Not on file    Non-medical: Not on file  Tobacco Use  . Smoking status: Current Every Day Smoker    Packs/day: 1.00    Types: Cigarettes  . Smokeless tobacco: Never Used  Substance and Sexual Activity  . Alcohol use: No    Alcohol/week: 0.0 standard drinks  . Drug use: No  . Sexual activity: Yes    Partners: Male  Lifestyle  . Physical activity:  Days per week: Not on file    Minutes per session: Not on file  . Stress: Not on file  Relationships  . Social connections:    Talks on phone: Not on file    Gets together: Not on file    Attends religious service: Not on file    Active member of club or organization: Not on file    Attends meetings of clubs or organizations: Not on file    Relationship status: Not on file  Other Topics Concern  . Not on file  Social History Narrative   Husband on disability   Pt also on disability   Past Surgical History:  Procedure Laterality Date  . ABDOMINAL HYSTERECTOMY    . APPENDECTOMY    . CHOLECYSTECTOMY    . COLONOSCOPY W/  BIOPSIES  2010   adenomatous polyp repeat 2015  . COLONOSCOPY WITH PROPOFOL N/A 03/13/2013   Procedure: COLONOSCOPY WITH PROPOFOL;  Surgeon: Lafayette Dragon, MD;  Location: [facility];  Service: Endoscopy;  Laterality: N/A;  . ESOPHAGOGASTRODUODENOSCOPY  2010   showed erosions  . LUMBAR FUSION  2011   L4-L5-S1 Dr. Marcial Pacas  . TONSILLECTOMY    . UPPER GASTROINTESTINAL ENDOSCOPY  2017   Past Medical History:  Diagnosis Date  . Anemia   . Arthritis   . Back pain, chronic   . Complication of anesthesia    manic episode  . Depression   . Diabetes mellitus type II   . GERD (gastroesophageal reflux disease)   . H/O hiatal hernia   . HEADACHE, CHRONIC 04/21/2008  . HX OF GALLSTONE 04/21/2008   Qualifier: Diagnosis of  By: Christine Bradshaw CMA ([place]), Estill Bamberg    . Hyperlipidemia   . Hypertension   . Insomnia    03-08-13"states is in control"  . Irritable bowel syndrome   . Memory loss of unknown cause   . Panic attack as reaction to stress   . Seizures ([place])    Passes out frequently, but not seizures per pt  . Syncope    Pt states happens once per week approximately  . Tremor    BP 110/77   Pulse 70   Ht 5\' 4"  (1.626 m)   Wt 220 lb (99.8 kg)   SpO2 97%   BMI 37.76 kg/m   Opioid Risk Score:   Fall Risk Score:  `1  Depression screen PHQ 2/9  Depression screen [facility] 2/9 07/24/2017 06/12/2017 05/24/2017 05/05/2017 04/07/2017 02/10/2017 01/27/2017  Decreased Interest 1 0 0 0 1 2 0  Down, Depressed, Hopeless 1 0 0 0 1 2 0  PHQ - 2 Score 2 0 0 0 2 4 0  Altered sleeping - - - - - - -  Tired, decreased energy - - - - - - -  Change in appetite - - - - - - -  Feeling bad or failure about yourself  - - - - - - -  Trouble concentrating - - - - - - -  Moving slowly or fidgety/restless - - - - - - -  Suicidal thoughts - - - - - - -  PHQ-9 Score - - - - - - -  Difficult doing work/chores - - - - - - -  Some recent data might be hidden  p  Review of Systems  Constitutional: Positive for unexpected  weight change.  HENT: Negative.   Eyes: Negative.   Respiratory: Negative.   Cardiovascular: Negative.   Gastrointestinal: Positive for constipation.  Endocrine: Negative.  Genitourinary: Positive for difficulty urinating.  Musculoskeletal: Positive for arthralgias, back pain and myalgias.  Skin: Negative.   Allergic/Immunologic: Negative.   Neurological: Negative.   Hematological: Negative.   Psychiatric/Behavioral: Positive for dysphoric mood. The patient is nervous/anxious.   All other systems reviewed and are negative.      Objective:   Physical Exam  Constitutional: She is oriented to person, place, and time. She appears well-developed and well-nourished.  HENT:  Head: Normocephalic and atraumatic.  Neck: Normal range of motion. Neck supple.  Cardiovascular: Normal rate and regular rhythm.  Pulmonary/Chest: Effort normal and breath sounds normal.  Musculoskeletal:  Normal Muscle Bulk and Muscle Testing Reveals: Upper Extremities: Full ROM and Muscle Strength 5/5 Lumbar Paraspinal Tenderness: L-4-L-5 Mainly Left Side Left Greater Trochanter Tenderness Arises from chair with ease Narrow Based Gait   Neurological: She is alert and oriented to person, place, and time.  Skin: Skin is warm and dry.  Psychiatric: She has a normal mood and affect. Her behavior is normal.  Nursing note and vitals reviewed.         Assessment & Plan:  1. Lumbar postlaminectomy syndrome- status post L4-5 and L5-S1 fusion with chronic postoperative pain as well as a chronic left L5 radiculopathy.Continue:Topamax. 12/06/2017 RefilledOxycodone10mg  one tablet every 8 hours #90. We will continue the opioid monitoring program, this consists of regular clinic visits, examinations, urine drug screen, pill counts as well as use of [place] Controlled Substance Reporting System. Continue with heat and exercise therapy. 2. Lumbar spondylosis above the level of the fusion: Continue HEP as  tolerated. Continue to Monitor.12/06/2017 3.LeftGreater Trochanteric BursitisS/P Left Hip Injection with relief noted. Continue with heat, Ice therapy and HEP as tolerated. 12/06/2017 4. Muscle Spasms: Continuewith current treatment withRobaxin as needed. 12/06/2017.  20 minutes of face to face patient care time was spent during this visit. All questions were encouraged and answered.  F/U in 2 months

## 2017-12-06 NOTE — Progress Notes (Signed)
Subjective:   Patient ID: Christine Bradshaw, female   DOB: 52 y.o.   MRN: [id number]   HPI She presents with a very painful left big toenail that she cannot trim it is getting worse and making shoe gear difficult   ROS      Objective:  Physical Exam  Neurovascular status intact with thick yellow brittle nail bed left hallux that is painful and continued skin irritation of the left arch that did not respond to oral antifungal and patient is going to see a dermatologist.  The nail is very sore and moderately loose     Assessment:  Traumatized left hallux nail with pain     Plan:  H&P condition reviewed treatment options discussed.  At this point I recommended nail removal and explained procedure and risk and patient wants surgery understanding risk.  I infiltrated the left hallux 60 mill grams like Marcaine mixture under sterile technique after sterile prep of the area I did remove the nail in toto and applied phenol to the base 5 applications 30 seconds followed by alcohol lavage sterile dressing.  Gave instructions on soaks and reappoint and encouraged to take dressing off if any throbbing were to occur and also wrote prescription for Corticosporin otic solution

## 2017-12-20 ENCOUNTER — Ambulatory Visit: Payer: BLUE CROSS/BLUE SHIELD

## 2017-12-20 ENCOUNTER — Ambulatory Visit
Admission: RE | Admit: 2017-12-20 | Discharge: 2017-12-20 | Disposition: A | Discharge status: Home / Self Care | Payer: BLUE CROSS/BLUE SHIELD | Source: Ambulatory Visit | Attending: Family Medicine | Admitting: Family Medicine

## 2017-12-20 DIAGNOSIS — L600 Ingrowing nail: Secondary | ICD-10-CM

## 2017-12-20 DIAGNOSIS — Z1231 Encounter for screening mammogram for malignant neoplasm of breast: Secondary | ICD-10-CM

## 2017-12-20 NOTE — Progress Notes (Signed)
Patient is here today for follow-up appointment, recent procedure performed 12/06/2017, removal of left hallux nail.  She states that overall the area is not hurting her and she is not having any issues with it at this time.  No redness, no erythema, no swelling, no drainage, no other signs and symptoms of infection.  Area is scabbed over and healing well at this time.  Discussed signs and symptoms of infection.  Verbal and written instructions were given to the patient.  She is to follow-up as needed with any acute symptom changes.

## 2017-12-20 NOTE — Patient Instructions (Signed)

## 2018-01-13 ENCOUNTER — Other Ambulatory Visit: Payer: Self-pay | Admitting: Family Medicine

## 2018-01-19 ENCOUNTER — Other Ambulatory Visit: Payer: Self-pay | Admitting: Family Medicine

## 2018-02-06 ENCOUNTER — Ambulatory Visit: Payer: BLUE CROSS/BLUE SHIELD | Admitting: Registered Nurse

## 2018-06-08 ENCOUNTER — Other Ambulatory Visit: Payer: Self-pay | Admitting: Physical Medicine & Rehabilitation

## 2018-07-17 ENCOUNTER — Encounter: Payer: Self-pay | Admitting: Gastroenterology

## 2019-03-25 IMAGING — MG 2D DIGITAL SCREENING BILATERAL MAMMOGRAM WITH CAD AND ADJUNCT TO
8 of 15 series · 8 of 31 positions shown · non-contrast
Comparison: Previous exam(s).

CLINICAL DATA: Screening.

EXAM:
2D DIGITAL SCREENING BILATERAL MAMMOGRAM WITH CAD AND ADJUNCT TOMO

[R MLO (1 of 2)]
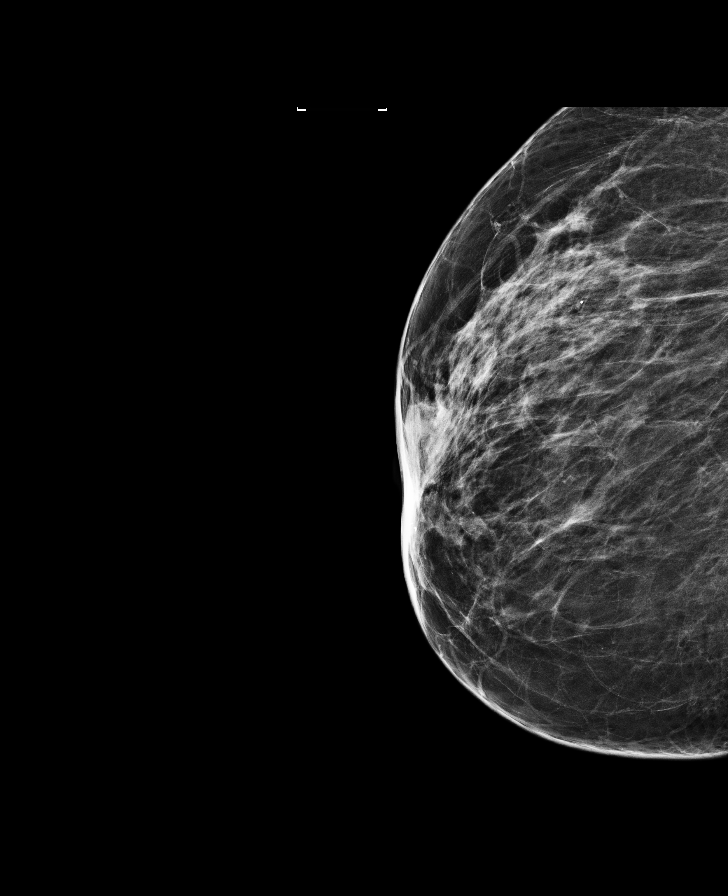

[L MLO]
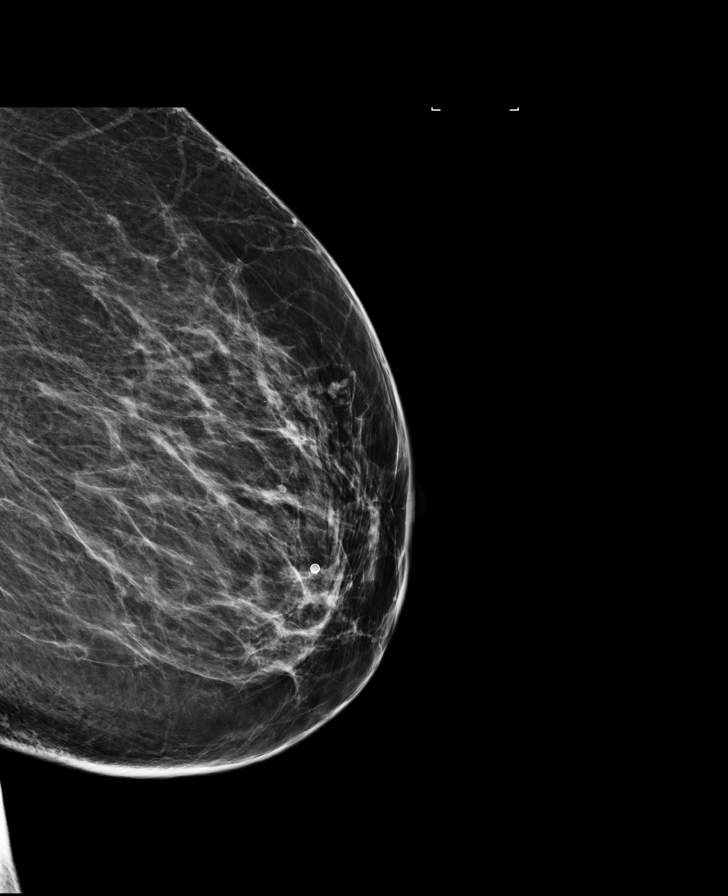

[R XCCL]
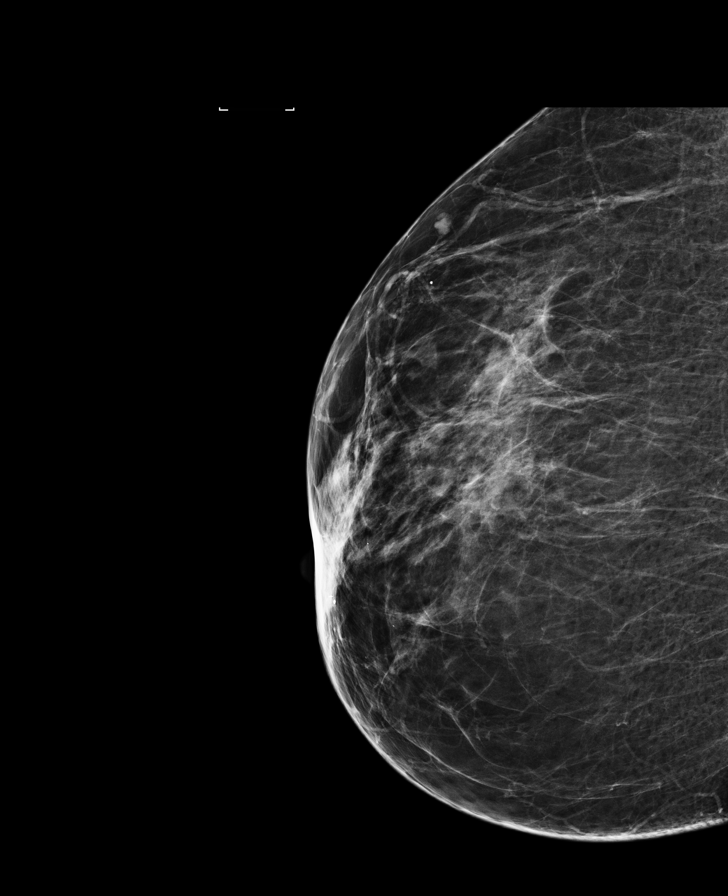

[R CC synth-2D]
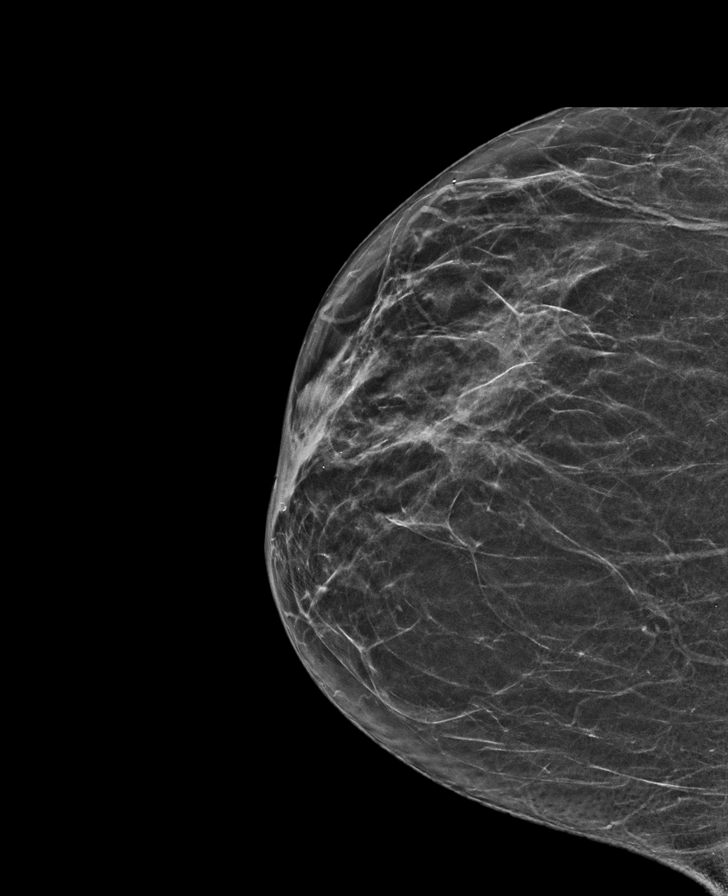

[L MLO synth-2D]
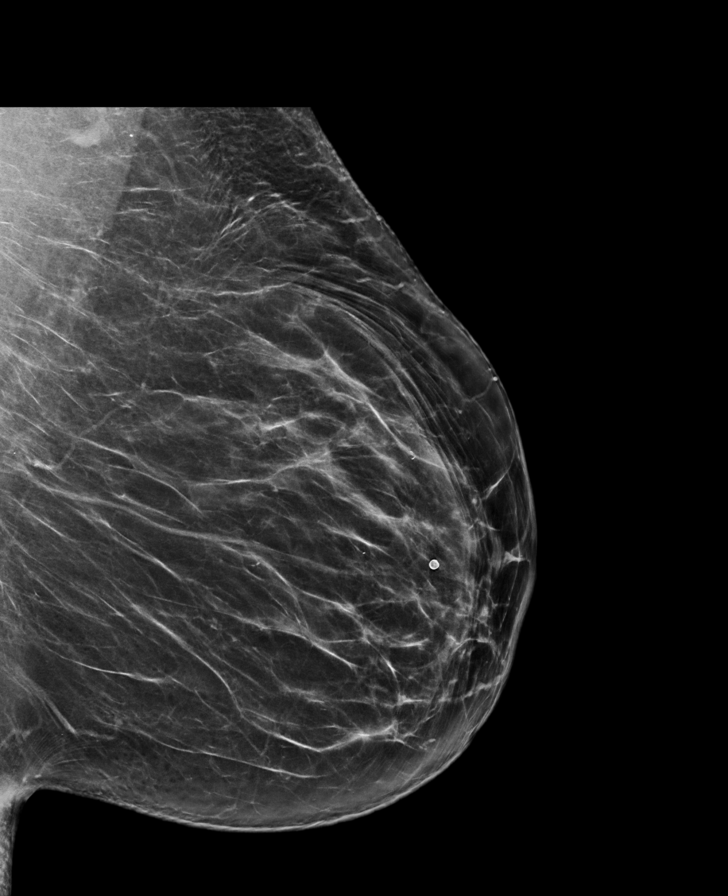

[L CC]
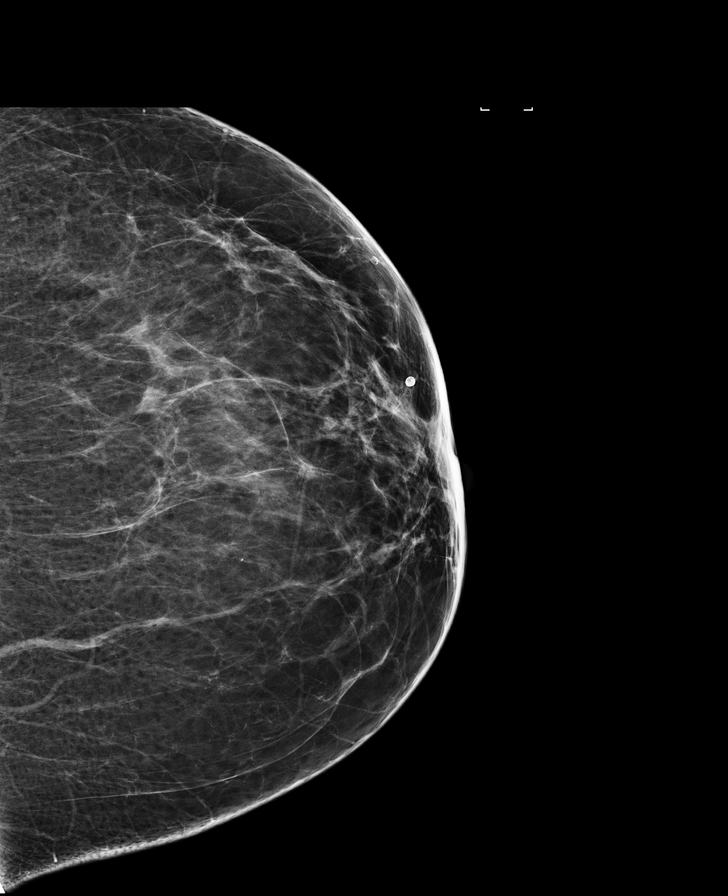

[R MLO (2 of 2)]
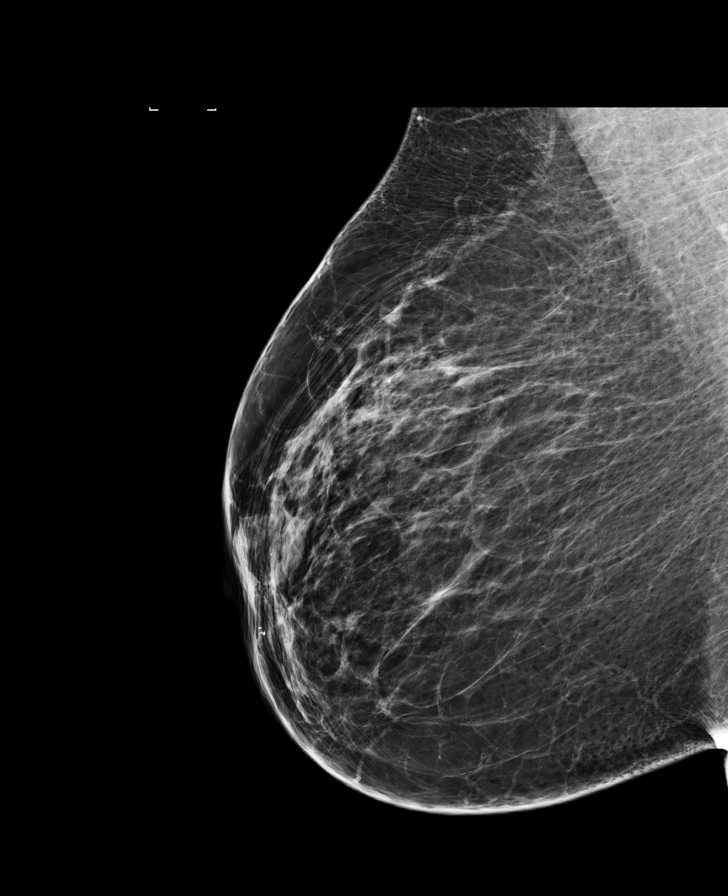

[R MLO synth-2D]
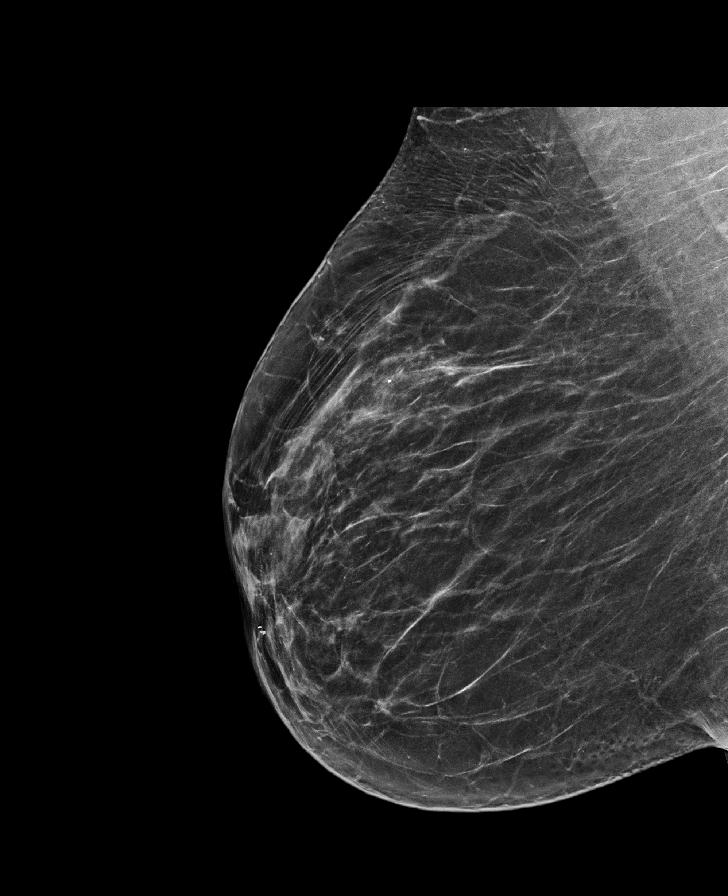

[8 of 31 positions shown; findings below may reference images not displayed]

ACR Breast Density Category b: There are scattered areas of
fibroglandular density.
FINDINGS: There are no findings suspicious for malignancy. Images were
processed with CAD.
IMPRESSION: No mammographic evidence of malignancy. A result letter of this
screening mammogram will be mailed directly to the patient.

RECOMMENDATION:
Screening mammogram in one year. (Code:97-6-RS4)

BI-RADS CATEGORY  1: Negative.
